# Patient Record
Sex: Female | Born: 1960 | Race: White | Hispanic: No | Marital: Married | Smoking: Current every day smoker
Health system: Southern US, Community
[De-identification: ages and names within clinical notes are randomized; demographics above are authoritative.]

## PROBLEM LIST (undated history)

## (undated) ENCOUNTER — Emergency Department (HOSPITAL_COMMUNITY): Payer: Medicare Other

## (undated) DIAGNOSIS — N939 Abnormal uterine and vaginal bleeding, unspecified: Secondary | ICD-10-CM

## (undated) DIAGNOSIS — D649 Anemia, unspecified: Secondary | ICD-10-CM

## (undated) DIAGNOSIS — I350 Nonrheumatic aortic (valve) stenosis: Secondary | ICD-10-CM

## (undated) DIAGNOSIS — I2510 Atherosclerotic heart disease of native coronary artery without angina pectoris: Secondary | ICD-10-CM

## (undated) DIAGNOSIS — Z9981 Dependence on supplemental oxygen: Secondary | ICD-10-CM

## (undated) DIAGNOSIS — I10 Essential (primary) hypertension: Secondary | ICD-10-CM

## (undated) DIAGNOSIS — F329 Major depressive disorder, single episode, unspecified: Secondary | ICD-10-CM

## (undated) DIAGNOSIS — G4730 Sleep apnea, unspecified: Secondary | ICD-10-CM

## (undated) DIAGNOSIS — E662 Morbid (severe) obesity with alveolar hypoventilation: Secondary | ICD-10-CM

## (undated) DIAGNOSIS — G4733 Obstructive sleep apnea (adult) (pediatric): Secondary | ICD-10-CM

## (undated) DIAGNOSIS — I429 Cardiomyopathy, unspecified: Secondary | ICD-10-CM

## (undated) DIAGNOSIS — I209 Angina pectoris, unspecified: Secondary | ICD-10-CM

## (undated) DIAGNOSIS — E119 Type 2 diabetes mellitus without complications: Secondary | ICD-10-CM

## (undated) DIAGNOSIS — R0600 Dyspnea, unspecified: Secondary | ICD-10-CM

## (undated) DIAGNOSIS — I6529 Occlusion and stenosis of unspecified carotid artery: Secondary | ICD-10-CM

## (undated) DIAGNOSIS — E079 Disorder of thyroid, unspecified: Secondary | ICD-10-CM

## (undated) DIAGNOSIS — J449 Chronic obstructive pulmonary disease, unspecified: Secondary | ICD-10-CM

## (undated) DIAGNOSIS — M1990 Unspecified osteoarthritis, unspecified site: Secondary | ICD-10-CM

## (undated) DIAGNOSIS — I447 Left bundle-branch block, unspecified: Secondary | ICD-10-CM

## (undated) DIAGNOSIS — I700 Atherosclerosis of aorta: Secondary | ICD-10-CM

## (undated) DIAGNOSIS — R7303 Prediabetes: Secondary | ICD-10-CM

## (undated) DIAGNOSIS — E039 Hypothyroidism, unspecified: Secondary | ICD-10-CM

## (undated) DIAGNOSIS — T8859XA Other complications of anesthesia, initial encounter: Secondary | ICD-10-CM

## (undated) DIAGNOSIS — N840 Polyp of corpus uteri: Secondary | ICD-10-CM

## (undated) DIAGNOSIS — F32A Depression, unspecified: Secondary | ICD-10-CM

## (undated) DIAGNOSIS — I509 Heart failure, unspecified: Secondary | ICD-10-CM

## (undated) HISTORY — DX: Nonrheumatic aortic (valve) stenosis: I35.0

## (undated) HISTORY — PX: TONSILLECTOMY: SUR1361

## (undated) HISTORY — PX: TUBAL LIGATION: SHX77

## (undated) HISTORY — PX: CHOLECYSTECTOMY: SHX55

## (undated) HISTORY — DX: Occlusion and stenosis of unspecified carotid artery: I65.29

---

## 2000-10-15 ENCOUNTER — Emergency Department (HOSPITAL_COMMUNITY): Admission: EM | Admit: 2000-10-15 | Discharge: 2000-10-15 | Payer: Self-pay | Admitting: *Deleted

## 2005-08-16 ENCOUNTER — Emergency Department: Payer: Self-pay | Admitting: Emergency Medicine

## 2005-10-14 ENCOUNTER — Other Ambulatory Visit: Payer: Self-pay

## 2005-10-14 ENCOUNTER — Emergency Department: Payer: Self-pay | Admitting: Emergency Medicine

## 2005-12-04 ENCOUNTER — Emergency Department: Payer: Self-pay | Admitting: Emergency Medicine

## 2006-01-19 ENCOUNTER — Emergency Department: Payer: Self-pay | Admitting: Emergency Medicine

## 2007-02-26 ENCOUNTER — Other Ambulatory Visit: Payer: Self-pay

## 2007-02-26 ENCOUNTER — Emergency Department: Payer: Self-pay | Admitting: Emergency Medicine

## 2010-03-19 ENCOUNTER — Emergency Department: Payer: Self-pay | Admitting: Emergency Medicine

## 2012-05-03 ENCOUNTER — Emergency Department: Payer: Self-pay | Admitting: Emergency Medicine

## 2013-02-27 ENCOUNTER — Emergency Department: Payer: Self-pay | Admitting: Emergency Medicine

## 2013-02-27 LAB — CBC WITH DIFFERENTIAL/PLATELET
Basophil #: 0.1 10*3/uL (ref 0.0–0.1)
Basophil %: 1.2 %
Eosinophil #: 0.5 10*3/uL (ref 0.0–0.7)
Eosinophil %: 5.9 %
HCT: 41.9 % (ref 35.0–47.0)
HGB: 13.9 g/dL (ref 12.0–16.0)
Lymphocyte #: 2 10*3/uL (ref 1.0–3.6)
Lymphocyte %: 26.5 %
MCH: 28.6 pg (ref 26.0–34.0)
MCHC: 33.1 g/dL (ref 32.0–36.0)
MCV: 86 fL (ref 80–100)
Monocyte #: 0.6 x10 3/mm (ref 0.2–0.9)
Monocyte %: 8 %
Neutrophil #: 4.5 10*3/uL (ref 1.4–6.5)
Neutrophil %: 58.4 %
Platelet: 156 10*3/uL (ref 150–440)
RBC: 4.84 10*6/uL (ref 3.80–5.20)
RDW: 13.7 % (ref 11.5–14.5)
WBC: 7.7 10*3/uL (ref 3.6–11.0)

## 2013-02-27 LAB — BASIC METABOLIC PANEL
Anion Gap: 0 — ABNORMAL LOW (ref 7–16)
BUN: 11 mg/dL (ref 7–18)
Calcium, Total: 9.1 mg/dL (ref 8.5–10.1)
Chloride: 105 mmol/L (ref 98–107)
Co2: 32 mmol/L (ref 21–32)
Creatinine: 0.72 mg/dL (ref 0.60–1.30)
EGFR (African American): >60
EGFR (Non-African Amer.): >60
Glucose: 100 mg/dL — ABNORMAL HIGH (ref 65–99)
Osmolality: 271 (ref 275–301)
Potassium: 4.2 mmol/L (ref 3.5–5.1)
Sodium: 136 mmol/L (ref 136–145)

## 2014-03-27 ENCOUNTER — Emergency Department: Payer: Self-pay | Admitting: Emergency Medicine

## 2014-03-29 ENCOUNTER — Emergency Department (HOSPITAL_COMMUNITY)
Admission: EM | Admit: 2014-03-29 | Discharge: 2014-03-29 | Disposition: A | Discharge status: Home / Self Care | Payer: Self-pay | Attending: Emergency Medicine | Admitting: Emergency Medicine

## 2014-03-29 DIAGNOSIS — M5431 Sciatica, right side: Secondary | ICD-10-CM | POA: Insufficient documentation

## 2014-03-29 MED ORDER — HYDROCODONE-ACETAMINOPHEN 5-325 MG PO TABS: 1.0000 | ORAL_TABLET | ORAL | Status: DC | PRN

## 2014-03-29 MED ORDER — HYDROCODONE-ACETAMINOPHEN 5-325 MG PO TABS
1.0000 | ORAL_TABLET | Freq: Once | ORAL | Status: AC
  Administered 2014-03-29: 1 via ORAL
  Filled 2014-03-29: qty 1

## 2014-03-29 MED ORDER — PREDNISONE 20 MG PO TABS: 40.0000 mg | ORAL_TABLET | Freq: Every day | ORAL | Status: DC

## 2014-03-29 MED ORDER — KETOROLAC TROMETHAMINE 60 MG/2ML IM SOLN
30.0000 mg | Freq: Once | INTRAMUSCULAR | Status: AC
  Administered 2014-03-29: 30 mg via INTRAMUSCULAR
  Filled 2014-03-29: qty 2

## 2014-03-29 NOTE — ED Notes (Signed)
Pt from home with chronic right hip pain.  Sts she has been seeing her PCP and the ED for management of pain with no relief.  Took 1 diazepam, naproxen, 3 tylenols and a muscle relaxer earlier today. Pain 7/10.

## 2014-03-29 NOTE — ED Provider Notes (Signed)
CSN: [id number]     Arrival date & time 03/29/14  1434 History   First MD Initiated Contact with Patient 03/29/14 1521     Chief Complaint  Patient presents with  . Hip Pain   Kristie Dorsey is a 54 y.o. female with a history of chronic back pain who presents emergency department complaining of right hip pain for the past 3 months. Patient reports her pain is 8 out of 10 and worse with movement. Patient's pain is stabbing. Her pain starts in her posterior buttocks and radiates down her posterior lateral leg. Patient denies any injury or trauma to her hip or leg patient put she has been to several physicians and to [facility] emergency department for treatment. Patient reports she had an x-ray of her hip done 2 days ago that was negative. She does not wish for further imaging at this time but does wish for further pain control. Patient reports that when she woke up this morning she took diazepam, Naprosyn, Tylenol and Flexeril which had some relief but her pain returned. Patient did not repeat her dose of medications as prescribed. Patient reports she has a follow-up appointment with her primary care provider Dr. Hessie DienerBender for tomorrow at 10 am. The patient denies fevers, chills, injury, trauma, history of cancer, history of IV drug use, loss of bowel or bladder control, numbness or tingling.  (Consider location/radiation/quality/duration/timing/severity/associated sxs/prior Treatment) HPI  No past medical history on file. No past surgical history on file. No family history on file. History  Substance Use Topics  . Smoking status: Not on file  . Smokeless tobacco: Not on file  . Alcohol Use: Not on file   OB History    No data available     Review of Systems  Constitutional: Negative for fever and chills.  HENT: Negative for ear pain.   Eyes: Negative for pain.  Respiratory: Negative for cough and shortness of breath.   Cardiovascular: Negative for chest pain.   Gastrointestinal: Negative for nausea, vomiting, abdominal pain and diarrhea.  Genitourinary: Negative for dysuria and hematuria.  Musculoskeletal: Negative for neck pain.       Right hip pain  Skin: Negative for wound.  Neurological: Negative for dizziness, light-headedness, numbness and headaches.      Allergies  Review of patient's allergies indicates not on file.  Home Medications   Prior to Admission medications   Medication Sig Start Date End Date Taking? Authorizing Provider  HYDROcodone-acetaminophen (NORCO/VICODIN) 5-325 MG per tablet Take 1-2 tablets by mouth every 4 (four) hours as needed for moderate pain or severe pain. 03/29/14   Einar GipWilliam Duncan Dragan Tamburrino, PA-C  predniSONE (DELTASONE) 20 MG tablet Take 2 tablets (40 mg total) by mouth daily. 03/29/14   Einar GipWilliam Duncan Reve Crocket, PA-C   BP 156/57 mmHg  Pulse 77  Temp(Src) 97.6 F (36.4 C) (Oral)  Resp 24  Ht 5\' 2"  (1.575 m)  Wt 312 lb (141.522 kg)  BMI 57.05 kg/m2  SpO2 96% Physical Exam  Constitutional: She is oriented to person, place, and time. She appears well-developed and well-nourished. No distress.  HENT:  Head: Normocephalic and atraumatic.  Eyes: Pupils are equal, round, and reactive to light. Right eye exhibits no discharge. Left eye exhibits no discharge.  Neck: Neck supple.  Cardiovascular: Normal rate, regular rhythm, normal heart sounds and intact distal pulses.   Bilateral radial pulses are intact. Bilateral posterior tibialis and dorsalis pedis pulses are intact.  Pulmonary/Chest: Effort normal and breath sounds normal. No  respiratory distress.  Musculoskeletal: She exhibits no tenderness.  Patient is able to ambulate in the room. Patient has 5 out of 5 strength in her bilateral lower extremities. Back is nontender to palpation. There is no deformity. Patient has full range of motion of her right hip.  Lymphadenopathy:    She has no cervical adenopathy.  Neurological: She is alert and oriented to  person, place, and time. Coordination normal.  Patient sensation is intact in her bilateral lower extremities.  Skin: Skin is warm and dry. No rash noted. She is not diaphoretic. No erythema. No pallor.  Psychiatric: She has a normal mood and affect. Her behavior is normal.  Nursing note and vitals reviewed.   ED Course  Procedures (including critical care time) Labs Review Labs Reviewed - No data to display  Imaging Review No results found.   EKG Interpretation None      Filed Vitals:   03/29/14 1438  BP: 156/57  Pulse: 77  Temp: 97.6 F (36.4 C)  TempSrc: Oral  Resp: 24  Height:  (1.575 m)  Weight: 312 lb (141.522 kg)  SpO2: 96%     MDM   Meds given in ED:  Medications  ketorolac (TORADOL) injection 30 mg (30 mg Intramuscular Given 03/29/14 1604)  HYDROcodone-acetaminophen (NORCO/VICODIN) 5-325 MG per tablet 1 tablet (1 tablet Oral Given 03/29/14 1604)    New Prescriptions   HYDROCODONE-ACETAMINOPHEN (NORCO/VICODIN) 5-325 MG PER TABLET    Take 1-2 tablets by mouth every 4 (four) hours as needed for moderate pain or severe pain.   PREDNISONE (DELTASONE) 20 MG TABLET    Take 2 tablets (40 mg total) by mouth daily.   Final diagnoses:  Sciatica, right   This is a 54 year old female who presents the emergency department complaining of 3 months of right hip pain. Based on history this sounds more like sciatica nerve pain. Her pain starts in her posterior buttocks and radiates down her posterior lateral leg. Patient was seen and evaluated at [facility] emergency department 2 days ago and had negative x-rays done of her hip. Patient does not wish for further imaging done at this time. I agree with this plan. Patient is neurologically intact. Patient is able to ambulate in the room. We'll provide the patient with an injection of Toradol and Norco tablet. We'll discharge the patient with Norco for breakthrough pain and steroid burst. Narcotic pain medication  precautions provided.  I advised patient continue to take Naprosyn as prescribed in the ED. The patient has a follow-up appointment with her primary care provider for tomorrow at 10 AM. I advised patient to keep this follow up appointment. I advised the patient to return to the emergency department with new or worsening symptoms or new concerns. The patient verbalized understanding and agreement with plan.       Lawana Chambers, PA-C 03/29/14 1614  Arby Barrette, MD 03/29/14 [phone]

## 2014-03-29 NOTE — Discharge Instructions (Signed)
Please take medications as prescribed. Please use caution while taking Norco as it can make you drowsy. Please do not take Tylenol with Norco as it hard he has Tylenol in it. Please continue taking Naprosyn as prescribed by your PCP. Please stop taking diazepam. Please keep your follow-up appointment with your primary care provider for tomorrow as discussed.  Sciatica Sciatica is pain, weakness, numbness, or tingling along the path of the sciatic nerve. The nerve starts in the lower back and runs down the back of each leg. The nerve controls the muscles in the lower leg and in the back of the knee, while also providing sensation to the back of the thigh, lower leg, and the sole of your foot. Sciatica is a symptom of another medical condition. For instance, nerve damage or certain conditions, such as a herniated disk or bone spur on the spine, pinch or put pressure on the sciatic nerve. This causes the pain, weakness, or other sensations normally associated with sciatica. Generally, sciatica only affects one side of the body. CAUSES   Herniated or slipped disc.  Degenerative disk disease.  A pain disorder involving the narrow muscle in the buttocks (piriformis syndrome).  Pelvic injury or fracture.  Pregnancy.  Tumor (rare). SYMPTOMS  Symptoms can vary from mild to very severe. The symptoms usually travel from the low back to the buttocks and down the back of the leg. Symptoms can include:  Mild tingling or dull aches in the lower back, leg, or hip.  Numbness in the back of the calf or sole of the foot.  Burning sensations in the lower back, leg, or hip.  Sharp pains in the lower back, leg, or hip.  Leg weakness.  Severe back pain inhibiting movement. These symptoms may get worse with coughing, sneezing, laughing, or prolonged sitting or standing. Also, being overweight may worsen symptoms. DIAGNOSIS  Your caregiver will perform a physical exam to look for common symptoms of sciatica.  He or she may ask you to do certain movements or activities that would trigger sciatic nerve pain. Other tests may be performed to find the cause of the sciatica. These may include:  Blood tests.  X-rays.  Imaging tests, such as an MRI or CT scan. TREATMENT  Treatment is directed at the cause of the sciatic pain. Sometimes, treatment is not necessary and the pain and discomfort goes away on its own. If treatment is needed, your caregiver may suggest:  Over-the-counter medicines to relieve pain.  Prescription medicines, such as anti-inflammatory medicine, muscle relaxants, or narcotics.  Applying heat or ice to the painful area.  Steroid injections to lessen pain, irritation, and inflammation around the nerve.  Reducing activity during periods of pain.  Exercising and stretching to strengthen your abdomen and improve flexibility of your spine. Your caregiver may suggest losing weight if the extra weight makes the back pain worse.  Physical therapy.  Surgery to eliminate what is pressing or pinching the nerve, such as a bone spur or part of a herniated disk. HOME CARE INSTRUCTIONS   Only take over-the-counter or prescription medicines for pain or discomfort as directed by your caregiver.  Apply ice to the affected area for 20 minutes, 3-4 times a day for the first 48-72 hours. Then try heat in the same way.  Exercise, stretch, or perform your usual activities if these do not aggravate your pain.  Attend physical therapy sessions as directed by your caregiver.  Keep all follow-up appointments as directed by your caregiver.  Do not wear high heels or shoes that do not provide proper support.  Check your mattress to see if it is too soft. A firm mattress may lessen your pain and discomfort. SEEK IMMEDIATE MEDICAL CARE IF:   You lose control of your bowel or bladder (incontinence).  You have increasing weakness in the lower back, pelvis, buttocks, or legs.  You have redness or  swelling of your back.  You have a burning sensation when you urinate.  You have pain that gets worse when you lie down or awakens you at night.  Your pain is worse than you have experienced in the past.  Your pain is lasting longer than 4 weeks.  You are suddenly losing weight without reason. MAKE SURE YOU:  Understand these instructions.  Will watch your condition.  Will get help right away if you are not doing well or get worse. Document Released: 02/10/2001 Document Revised: 08/18/2011 Document Reviewed: 06/28/2011 [facility] Patient Information 2015 [employer] [place]. This information is not intended to replace advice given to you by your health care provider. Make sure you discuss any questions you have with your health care provider.

## 2014-03-29 NOTE — ED Notes (Signed)
Pt was seen at [facility] ED Tuesday and was given medications for pain. Pain was relieved with medication. Pt has not taken medication today. PA discussing that medication wears off with pt.

## 2016-03-21 ENCOUNTER — Encounter: Payer: Self-pay | Admitting: Emergency Medicine

## 2016-03-21 ENCOUNTER — Emergency Department
Admission: EM | Admit: 2016-03-21 | Discharge: 2016-03-21 | Disposition: A | Discharge status: Home / Self Care | Payer: Self-pay | Attending: Emergency Medicine | Admitting: Emergency Medicine

## 2016-03-21 DIAGNOSIS — K0889 Other specified disorders of teeth and supporting structures: Secondary | ICD-10-CM

## 2016-03-21 DIAGNOSIS — K047 Periapical abscess without sinus: Secondary | ICD-10-CM | POA: Insufficient documentation

## 2016-03-21 MED ORDER — NAPROXEN 500 MG PO TABS: 500.0000 mg | ORAL_TABLET | Freq: Two times a day (BID) | ORAL | 0 refills | Status: DC

## 2016-03-21 MED ORDER — OXYCODONE-ACETAMINOPHEN 5-325 MG PO TABS: 1.0000 | ORAL_TABLET | ORAL | 0 refills | Status: DC | PRN

## 2016-03-21 MED ORDER — AMOXICILLIN 875 MG PO TABS: 875.0000 mg | ORAL_TABLET | Freq: Two times a day (BID) | ORAL | 0 refills | Status: DC

## 2016-03-21 NOTE — ED Triage Notes (Signed)
Pt reports left side upper dental pain.

## 2016-03-21 NOTE — ED Provider Notes (Signed)
[facility] Emergency Department Provider Note   ____________________________________________   First MD Initiated Contact with Patient 03/21/16 1827     (approximate)  I have reviewed the triage vital signs and the nursing notes.   HISTORY  Chief Complaint Dental Pain    HPI Kristie Dorsey is a 56 y.o. female presents for evaluation of left upper dental pain. Patient is edentulous except for 3 teeth. States the pain started about 3 days ago and is progressively gotten worse. Relief with over-the-counter medication and plans on seeing a dentist to get the rest of her teeth removed.   No past medical history on file.  There are no active problems to display for this patient.   No past surgical history on file.  Prior to Admission medications   Medication Sig Start Date End Date Taking? Authorizing Provider  amoxicillin (AMOXIL) 875 MG tablet Take 1 tablet (875 mg total) by mouth 2 (two) times daily. 03/21/16   Evangeline Dakin, PA-C  naproxen (NAPROSYN) 500 MG tablet Take 1 tablet (500 mg total) by mouth 2 (two) times daily with a meal. 03/21/16   Evangeline Dakin, PA-C  oxyCODONE-acetaminophen (ROXICET) 5-325 MG tablet Take 1-2 tablets by mouth every 4 (four) hours as needed for severe pain. 03/21/16   Evangeline Dakin, PA-C    Allergies Patient has no known allergies.  No family history on file.  Social History Social History  Substance Use Topics  . Smoking status: Not on file  . Smokeless tobacco: Not on file  . Alcohol use Not on file    Review of Systems Constitutional: No fever/chills ENT: No sore throat.Positive for left upper dental pain. Cardiovascular: Denies chest pain. Respiratory: Denies shortness of breath. Skin: Negative for rash. Neurological: Negative for headaches, focal weakness or numbness.  10-point ROS otherwise negative.  ____________________________________________   PHYSICAL EXAM:  VITAL SIGNS: ED  Triage Vitals  Enc Vitals Group     BP 03/21/16 1804 (!) 152/54     Pulse Rate 03/21/16 1804 77     Resp 03/21/16 1804 18     Temp 03/21/16 1804 98.4 F (36.9 C)     Temp Source 03/21/16 1804 Oral     SpO2 03/21/16 1804 96 %     Weight 03/21/16 1804 283 lb (128.4 kg)     Height 03/21/16 1804 5\' 2"  (1.575 m)     Head Circumference --      Peak Flow --      Pain Score 03/21/16 1805 9     Pain Loc --      Pain Edu? --      Excl. in GC? --     Constitutional: Alert and oriented. Well appearing and in no acute distress. Head: Atraumatic.The left maxillary tenderness and edema noted. Nose: No congestion/rhinnorhea. Mouth/Throat: Mucous membranes are moist.  Oropharynx non-erythematous.Mostly edentulous positive dental caries with remaining teeth. Neck: No stridor. Full full range of motion nontender.   Cardiovascular: Normal rate, regular rhythm. Grossly normal heart sounds.  Good peripheral circulation. Respiratory: Normal respiratory effort.  No retractions. Lungs CTAB. Neurologic:  Normal speech and language. No gross focal neurologic deficits are appreciated. No gait instability. Skin:  Skin is warm, dry and intact. No rash noted. Psychiatric: Mood and affect are normal. Speech and behavior are normal.  ____________________________________________   LABS (all labs ordered are listed, but only abnormal results are displayed)  Labs Reviewed - No data to display ____________________________________________  EKG  ____________________________________________  RADIOLOGY   ____________________________________________   PROCEDURES  Procedure(s) performed: None  Procedures  Critical Care performed: No  ____________________________________________   INITIAL IMPRESSION / ASSESSMENT AND PLAN / ED COURSE  Pertinent labs & imaging results that were available during my care of the patient were reviewed by me and considered in my medical decision making (see chart for  details).  Acute dental abscess. Rx given for amoxicillin 500 mg 3 times a day, Percocet 5/325 and Naprosyn 500 mg twice a day. Patient follow-up PCP or return to ER with any worsening symptomology      ____________________________________________   FINAL CLINICAL IMPRESSION(S) / ED DIAGNOSES  Final diagnoses:  Pain, dental  Abscess, dental      NEW MEDICATIONS STARTED DURING THIS VISIT:  Discharge Medication List as of 03/21/2016  6:50 PM    START taking these medications   Details  amoxicillin (AMOXIL) 875 MG tablet Take 1 tablet (875 mg total) by mouth 2 (two) times daily., Starting Sat 03/21/2016, Print    naproxen (NAPROSYN) 500 MG tablet Take 1 tablet (500 mg total) by mouth 2 (two) times daily with a meal., Starting Sat 03/21/2016, Print    oxyCODONE-acetaminophen (ROXICET) 5-325 MG tablet Take 1-2 tablets by mouth every 4 (four) hours as needed for severe pain., Starting Sat 03/21/2016, Print         Note:  This document was prepared using Dragon voice recognition software and may include unintentional dictation errors.   Evangeline Dakinharles M Zurisadai Helminiak, PA-C 03/21/16 1944    Jene Everyobert Kinner, MD 03/21/16 [place]

## 2016-03-21 NOTE — Discharge Instructions (Signed)
OPTIONS FOR DENTAL FOLLOW UP CARE ° °[place] Department of Health and Human Services - Local Safety Net Dental Clinics °http://www.ncdhhs.gov/dph/oralhealth/services/safetynetclinics.htm °  °[facility] (336-562-3123) ° °[facility] [place] (919-933-9087) ° °[facility] ([phone] ext [phone]) ° °[facility] ([phone]) ° °[facility] ([phone]) °This clinic caters to the indigent population and is on a lottery system. °Location: °[facility], [address], [place] °Clinic Hours: °Wednesdays from 6pm - 9pm, patients seen by a lottery system. °For dates, call or go to www.med.[facility].edu/[facility]/patients/Dental-[facility] °Services: °Cleanings, fillings and simple extractions. °Payment Options: °DENTAL WORK IS FREE OF CHARGE. Bring proof of income or support. °Best way to get seen: °Arrive at 5:15 pm - this is a lottery, NOT first come/first serve, so arriving earlier will not increase your chances of being seen. °  °  °[facility] °919-537-3737 °Select option 1 for emergencies °  °Location: °[facility], [address], [place] °Clinic Hours: °No walk-ins accepted - call the day before to schedule an appointment. °Check in times are 9:30 am and 1:30 pm. °Services: °Simple extractions, temporary fillings, pulpectomy/pulp debridement, uncomplicated abscess drainage. °Payment Options: °PAYMENT IS DUE AT THE TIME OF SERVICE.  Fee is usually $100-200, additional surgical procedures (e.g. abscess drainage) may be extra. °Cash, checks, Visa/MasterCard accepted.  Can file Medicaid if patient is covered for dental - patient should call case worker to check. °No discount for [facility] patients. °Best way to get seen: °MUST call the day before and get onto the schedule. Can usually be seen the next 1-2 days. No walk-ins accepted. °  °  °[facility] °[phone] °   °Location: °[facility], [address], [place] °Clinic Hours: °M, W, Th, F 8am or 1:30pm, Tues 9a or 1:30 - first come/first served. °Services: °Simple extractions, temporary fillings, uncomplicated abscess drainage.  You do not need to be an [place] resident. °Payment Options: °PAYMENT IS DUE AT THE TIME OF SERVICE. °Dental insurance, otherwise sliding scale - bring proof of income or support. °Depending on income and treatment needed, cost is usually $50-200. °Best way to get seen: °Arrive early as it is first come/first served. °  °  °[facility] °[phone] °  °Location: °[address] °Clinic Hours: °Mon-Thu 8a-5p °Services: °Most basic dental services including extractions and fillings. °Payment Options: °PAYMENT IS DUE AT THE TIME OF SERVICE. °Sliding scale, up to 50% off - bring proof if income or support. °Medicaid with dental option accepted. °Best way to get seen: °Call to schedule an appointment, can usually be seen within 2 weeks OR they will try to see walk-ins - show up at 8a or 2p (you may have to wait). °  °  °[facility] °919-245-2435 °[place] RESIDENTS ONLY °  °Location: °[facility], [address], [place], [place] [address] °Clinic Hours: By appointment only. °Monday - Thursday 8am-5pm, Friday 8am-12pm °Services: Cleanings, fillings, extractions. °Payment Options: °PAYMENT IS DUE AT THE TIME OF SERVICE. °Cash, Visa or MasterCard. Sliding scale - $30 minimum per service. °Best way to get seen: °Come in to office, complete packet and make an appointment - need proof of income °or support monies for each household member and proof of [place] residence. °Usually takes about a month to get in. °  °  °[facility] °[phone] °  °Location: °[address]   [place] °Clinic Hours: Walk-in Urgent Care Dental Services are offered Monday-Friday  mornings only. °The numbers of emergencies accepted daily is limited to the number of °providers available. °Maximum 15 - Mondays, Wednesdays & Thursdays °Maximum 10 - Tuesdays & Fridays °Services: °You do not need to be a [place] resident to be seen for a dental emergency. °Emergencies are defined as pain, swelling, abnormal bleeding, or dental trauma. Walkins will receive x-rays if needed. °NOTE: Dental cleaning is not an emergency. °Payment Options: °PAYMENT IS DUE AT THE TIME OF SERVICE. °Minimum co-pay is $40.00 for uninsured patients. °Minimum co-pay is $3.00 for Medicaid with dental coverage. °Dental Insurance is accepted and must be presented at time of visit. °Medicare does not cover dental. °Forms of payment: Cash, credit card, checks. °Best way to get seen: °If not previously registered with the clinic, walk-in dental registration begins at 7:15 am and is on a first come/first serve basis. °If previously registered with the clinic, call to make an appointment. °  °  °[facility] °919-776-4359 °[place] RESIDENTS ONLY °  °Location: °[address], [place], [place] °Clinic Hours: °Mon-Thu 10a-2p °Services: Extractions only! °Payment Options: °FREE (donations accepted) - bring proof of income or support °Best way to get seen: °Call and schedule an appointment OR come at 8am on the 1st Monday of every month (except for holidays) when it is first come/first served. °  °  °[facility] °[phone] °  °Location: °[address], [place] °Clinic Hours: °Friday mornings °Services, Payment Options, Best way to get seen: °Call for info °

## 2016-03-21 NOTE — ED Notes (Signed)
Left side dental pain for the past several days, worsening in pain. Painful to chew and talk.

## 2016-08-13 ENCOUNTER — Emergency Department
Admission: EM | Admit: 2016-08-13 | Discharge: 2016-08-13 | Disposition: A | Discharge status: Home / Self Care | Payer: Self-pay | Attending: Emergency Medicine | Admitting: Emergency Medicine

## 2016-08-13 ENCOUNTER — Emergency Department: Payer: Self-pay

## 2016-08-13 ENCOUNTER — Encounter: Payer: Self-pay | Admitting: Emergency Medicine

## 2016-08-13 DIAGNOSIS — M25561 Pain in right knee: Secondary | ICD-10-CM | POA: Insufficient documentation

## 2016-08-13 DIAGNOSIS — Y929 Unspecified place or not applicable: Secondary | ICD-10-CM | POA: Insufficient documentation

## 2016-08-13 DIAGNOSIS — I10 Essential (primary) hypertension: Secondary | ICD-10-CM | POA: Insufficient documentation

## 2016-08-13 DIAGNOSIS — S52124A Nondisplaced fracture of head of right radius, initial encounter for closed fracture: Secondary | ICD-10-CM | POA: Insufficient documentation

## 2016-08-13 DIAGNOSIS — S62001A Unspecified fracture of navicular [scaphoid] bone of right wrist, initial encounter for closed fracture: Secondary | ICD-10-CM | POA: Insufficient documentation

## 2016-08-13 DIAGNOSIS — F17210 Nicotine dependence, cigarettes, uncomplicated: Secondary | ICD-10-CM | POA: Insufficient documentation

## 2016-08-13 DIAGNOSIS — Y999 Unspecified external cause status: Secondary | ICD-10-CM | POA: Insufficient documentation

## 2016-08-13 DIAGNOSIS — W19XXXA Unspecified fall, initial encounter: Secondary | ICD-10-CM | POA: Insufficient documentation

## 2016-08-13 DIAGNOSIS — Y939 Activity, unspecified: Secondary | ICD-10-CM | POA: Insufficient documentation

## 2016-08-13 HISTORY — DX: Essential (primary) hypertension: I10

## 2016-08-13 MED ORDER — OXYCODONE-ACETAMINOPHEN 5-325 MG PO TABS
1.0000 | ORAL_TABLET | Freq: Once | ORAL | Status: AC
  Administered 2016-08-13: 1 via ORAL
  Filled 2016-08-13: qty 1

## 2016-08-13 MED ORDER — OXYCODONE-ACETAMINOPHEN 5-325 MG PO TABS: 1.0000 | ORAL_TABLET | Freq: Four times a day (QID) | ORAL | 0 refills | Status: AC | PRN

## 2016-08-13 NOTE — ED Notes (Signed)
See triage note  Larey SeatFell having pain to right shoulder,elbow and hand  No deformity noted positive pulses

## 2016-08-13 NOTE — ED Provider Notes (Signed)
[facility] Emergency Department Provider Note  ____________________________________________  Time seen: Approximately 7:48 PM  I have reviewed the triage vital signs and the nursing notes.   HISTORY  Chief Complaint Arm Pain and Fall    HPI Kristie Dorsey is a 56 y.o. female presenting to the emergency department with 10/10 aching right elbow, right shoulder, right hand and right knee pain after patient lost her footing and fell. Patient denies hitting her head. Patient denies radiculopathy, changes in sensation or weakness. Patient denies associated chest pain, chest tightness, nausea, vomiting or abdominal pain. Patient states that she has been unable to bear weight due to right knee pain since the fall. No alleviating measures have been attempted.   Past Medical History:  Diagnosis Date  . Hypertension     There are no active problems to display for this patient.   Past Surgical History:  Procedure Laterality Date  . CHOLECYSTECTOMY      Prior to Admission medications   Medication Sig Start Date End Date Taking? Authorizing Provider  amoxicillin (AMOXIL) 875 MG tablet Take 1 tablet (875 mg total) by mouth 2 (two) times daily. 03/21/16   Beers, Charmayne Sheerharles M, PA-C  naproxen (NAPROSYN) 500 MG tablet Take 1 tablet (500 mg total) by mouth 2 (two) times daily with a meal. 03/21/16   Beers, Charmayne Sheerharles M, PA-C  oxyCODONE-acetaminophen (ROXICET) 5-325 MG tablet Take 1 tablet by mouth every 6 (six) hours as needed for severe pain. 08/13/16 08/18/16  Orvil FeilWoods, Emiliana Blaize M, PA-C    Allergies Patient has no known allergies.  No family history on file.  Social History Social History  Substance Use Topics  . Smoking status: Current Every Day Smoker    Packs/day: 0.50    Types: Cigarettes  . Smokeless tobacco: Current User     Comment: vape  . Alcohol use No     Review of Systems  Constitutional: No fever/chills Eyes: No visual changes. No discharge ENT: No  upper respiratory complaints. Cardiovascular: no chest pain. Respiratory: no cough. No SOB. Musculoskeletal: She has right elbow, right shoulder, right hand and right knee pain Skin: Negative for rash, abrasions, lacerations, ecchymosis. Neurological: Negative for headaches, focal weakness or numbness.  ____________________________________________   PHYSICAL EXAM:  VITAL SIGNS: ED Triage Vitals  Enc Vitals Group     BP 08/13/16 1746 (!) 113/49     Pulse Rate 08/13/16 1746 71     Resp 08/13/16 1746 16     Temp 08/13/16 1746 97.7 F (36.5 C)     Temp Source 08/13/16 1746 Oral     SpO2 08/13/16 1746 98 %     Weight 08/13/16 1747 298 lb (135.2 kg)     Height 08/13/16 1747 5\' 2"  (1.575 m)     Head Circumference --      Peak Flow --      Pain Score 08/13/16 1754 10     Pain Loc --      Pain Edu? --      Excl. in GC? --      Constitutional: Alert and oriented. Well appearing and in no acute distress. Eyes: Conjunctivae are normal. PERRL. EOMI. Head: Atraumatic. Cardiovascular: Normal rate, regular rhythm. Normal S1 and S2.  Good peripheral circulation. Respiratory: Normal respiratory effort without tachypnea or retractions. Lungs CTAB. Good air entry to the bases with no decreased or absent breath sounds. Musculoskeletal: She has 5 out of 5 strength in the upper extremities bilaterally. Patient is unable to  perform full range of motion at the right elbow.Patient performs full range of motion at the right wrist and right shoulder. She is able to move all 5 right fingers. No weakness was elicited with rotator cuff testing. Patient has mild pain with palpation over the anatomical snuffbox. Patient is unwilling to perform full range of motion at the right knee. Negative anterior and posterior drawer test, right. Negative ballottement. No laxity of the MCL or LCL testing. Palpable radial, ulnar and dorsalis pedis pulses. Neurologic:  Normal speech and language. No gross focal neurologic  deficits are appreciated.  Skin:  Skin is warm, dry and intact. No rash noted. Psychiatric: Mood and affect are normal. Speech and behavior are normal. Patient exhibits appropriate insight and judgement.   ____________________________________________   LABS (all labs ordered are listed, but only abnormal results are displayed)  Labs Reviewed - No data to display ____________________________________________  EKG   ____________________________________________  RADIOLOGY Geraldo Pitter, personally viewed and evaluated these images (plain radiographs) as part of my medical decision making, as well as reviewing the written report by the radiologist.  Dg Shoulder Right  Result Date: 08/13/2016 CLINICAL DATA:  Status post fall with right shoulder pain. EXAM: RIGHT SHOULDER - 2+ VIEW COMPARISON:  None. FINDINGS: There is no evidence of fracture or dislocation. Soft tissues are unremarkable. IMPRESSION: No acute fracture or dislocation. Electronically Signed   By: Sherian Rein M.D.   On: 08/13/2016 18:50   Dg Elbow Complete Right  Result Date: 08/13/2016 CLINICAL DATA:  Fall with pain to the elbow EXAM: RIGHT ELBOW - COMPLETE 3+ VIEW COMPARISON:  None. FINDINGS: Large elbow effusion. Mildly depressed and displaced fracture involving the radial head with intra-articular extension. Lateral view demonstrates a small linear suspected osseous fragment overlying the joint effusion. IMPRESSION: Large elbow effusion with mildly displaced and depressed intra-articular fracture of the radial head. Suspect small bone fragment over the elbow effusion. Electronically Signed   By: Jasmine Pang M.D.   On: 08/13/2016 18:51   Dg Knee Complete 4 Views Right  Result Date: 08/13/2016 CLINICAL DATA:  Right knee pain following a fall. EXAM: RIGHT KNEE - COMPLETE 4+ VIEW COMPARISON:  None. FINDINGS: Moderate spur formation involving all 3 joint compartments. No fracture, dislocation or effusion. IMPRESSION: No  fracture.  Moderate tricompartmental degenerative changes. Electronically Signed   By: Beckie Salts M.D.   On: 08/13/2016 20:15   Dg Hand Complete Right  Result Date: 08/13/2016 CLINICAL DATA:  Mechanical fall with pain EXAM: RIGHT HAND - COMPLETE 3+ VIEW COMPARISON:  None. FINDINGS: Possible subtle linear lucency within the mid to distal pole of the scaphoid bone on the oblique view. No subluxation. No radiopaque foreign body. IMPRESSION: Possible linear lucency/subtle nondisplaced fracture of the mid to distal scaphoid, correlate clinically for snuffbox tenderness. Consider dedicated scaphoid view. Electronically Signed   By: Jasmine Pang M.D.   On: 08/13/2016 18:53    ____________________________________________    PROCEDURES  Procedure(s) performed:    Procedures    Medications  oxyCODONE-acetaminophen (PERCOCET/ROXICET) 5-325 MG per tablet 1 tablet (1 tablet Oral Given 08/13/16 1953)     ____________________________________________   INITIAL IMPRESSION / ASSESSMENT AND PLAN / ED COURSE  Pertinent labs & imaging results that were available during my care of the patient were reviewed by me and considered in my medical decision making (see chart for details).  Review of the [place] CSRS was performed in accordance of the [facility] prior to dispensing any controlled drugs.  Assessment and plan: Fall Patient presents to the emergency department with right shoulder, right elbow, right hand and right knee pain. DG right shoulder reveals no acute fractures or bony abnormalities. DG right elbow reveals an intra-articular radial head fracture. DG right hand reveals likely scaphoid fracture. DG right knee reveals no acute abnormality. Patient was placed in a splint to immobilize both the right elbow and wrist in the emergency department. Roxicet was given for pain. A referral was given orthopedics, Dr. Martha Clan. Patient was advised to make an appointment as soon as possible for follow-up.   Patient was discharged with roxicet. All patient questions were answered.    ____________________________________________  FINAL CLINICAL IMPRESSION(S) / ED DIAGNOSES  Final diagnoses:  Fall, initial encounter  Closed nondisplaced fracture of head of right radius, initial encounter  Closed nondisplaced fracture of scaphoid of right wrist, unspecified portion of scaphoid, initial encounter      NEW MEDICATIONS STARTED DURING THIS VISIT:  New Prescriptions   OXYCODONE-ACETAMINOPHEN (ROXICET) 5-325 MG TABLET    Take 1 tablet by mouth every 6 (six) hours as needed for severe pain.        This chart was dictated using voice recognition software/Dragon. Despite best efforts to proofread, errors can occur which can change the meaning. Any change was purely unintentional.    Orvil Feil, PA-C 08/13/16 2149    Charlynne Pander, MD 08/13/16 2236

## 2016-08-13 NOTE — ED Triage Notes (Signed)
Pt to ED via POV with c/o mechanical fall today, denies any LOC. Pt c/o RT hand, elbow, and shoulder pain from falling onto RT side. Pt denies any head injury. Pt A&OX4

## 2016-08-13 NOTE — ED Notes (Signed)

## 2018-02-28 ENCOUNTER — Emergency Department: Payer: Self-pay

## 2018-02-28 ENCOUNTER — Encounter: Payer: Self-pay | Admitting: Emergency Medicine

## 2018-02-28 ENCOUNTER — Other Ambulatory Visit: Payer: Self-pay

## 2018-02-28 ENCOUNTER — Emergency Department
Admission: EM | Admit: 2018-02-28 | Discharge: 2018-03-01 | Disposition: A | Discharge status: Home / Self Care | Payer: Self-pay | Attending: Emergency Medicine | Admitting: Emergency Medicine

## 2018-02-28 DIAGNOSIS — R0902 Hypoxemia: Secondary | ICD-10-CM | POA: Insufficient documentation

## 2018-02-28 DIAGNOSIS — J441 Chronic obstructive pulmonary disease with (acute) exacerbation: Secondary | ICD-10-CM

## 2018-02-28 DIAGNOSIS — J449 Chronic obstructive pulmonary disease, unspecified: Secondary | ICD-10-CM | POA: Insufficient documentation

## 2018-02-28 DIAGNOSIS — F17210 Nicotine dependence, cigarettes, uncomplicated: Secondary | ICD-10-CM | POA: Insufficient documentation

## 2018-02-28 DIAGNOSIS — R0989 Other specified symptoms and signs involving the circulatory and respiratory systems: Secondary | ICD-10-CM

## 2018-02-28 HISTORY — DX: Morbid (severe) obesity due to excess calories: E66.01

## 2018-02-28 HISTORY — DX: Major depressive disorder, single episode, unspecified: F32.9

## 2018-02-28 HISTORY — DX: Depression, unspecified: F32.A

## 2018-02-28 HISTORY — DX: Disorder of thyroid, unspecified: E07.9

## 2018-02-28 HISTORY — DX: Chronic obstructive pulmonary disease, unspecified: J44.9

## 2018-02-28 HISTORY — DX: Heart failure, unspecified: I50.9

## 2018-02-28 LAB — CBC WITH DIFFERENTIAL/PLATELET
Abs Immature Granulocytes: 0.03 10*3/uL (ref 0.00–0.07)
Basophils Absolute: 0 10*3/uL (ref 0.0–0.1)
Basophils Relative: 1 %
Eosinophils Absolute: 0.3 10*3/uL (ref 0.0–0.5)
Eosinophils Relative: 4 %
HCT: 38.6 % (ref 36.0–46.0)
Hemoglobin: 12 g/dL (ref 12.0–15.0)
Immature Granulocytes: 0 %
Lymphocytes Relative: 21 %
Lymphs Abs: 1.6 10*3/uL (ref 0.7–4.0)
MCH: 28.8 pg (ref 26.0–34.0)
MCHC: 31.1 g/dL (ref 30.0–36.0)
MCV: 92.8 fL (ref 80.0–100.0)
Monocytes Absolute: 0.5 10*3/uL (ref 0.1–1.0)
Monocytes Relative: 6 %
Neutro Abs: 5.4 10*3/uL (ref 1.7–7.7)
Neutrophils Relative %: 68 %
Platelets: 175 10*3/uL (ref 150–400)
RBC: 4.16 MIL/uL (ref 3.87–5.11)
RDW: 13.4 % (ref 11.5–15.5)
WBC: 8 10*3/uL (ref 4.0–10.5)
nRBC: 0 % (ref 0.0–0.2)

## 2018-02-28 LAB — COMPREHENSIVE METABOLIC PANEL
ALT: 15 U/L (ref 0–44)
AST: 14 U/L — ABNORMAL LOW (ref 15–41)
Albumin: 3.3 g/dL — ABNORMAL LOW (ref 3.5–5.0)
Alkaline Phosphatase: 72 U/L (ref 38–126)
Anion gap: 6 (ref 5–15)
BUN: 14 mg/dL (ref 6–20)
CO2: 31 mmol/L (ref 22–32)
Calcium: 8.9 mg/dL (ref 8.9–10.3)
Chloride: 99 mmol/L (ref 98–111)
Creatinine, Ser: 0.79 mg/dL (ref 0.44–1.00)
GFR calc Af Amer: >60 mL/min (ref >60)
GFR calc non Af Amer: >60 mL/min (ref >60)
Glucose, Bld: 116 mg/dL — ABNORMAL HIGH (ref 70–99)
Potassium: 4.3 mmol/L (ref 3.5–5.1)
Sodium: 136 mmol/L (ref 135–145)
Total Bilirubin: 0.5 mg/dL (ref 0.3–1.2)
Total Protein: 6.8 g/dL (ref 6.5–8.1)

## 2018-02-28 LAB — BRAIN NATRIURETIC PEPTIDE: B Natriuretic Peptide: 73 pg/mL (ref 0.0–100.0)

## 2018-02-28 LAB — TROPONIN I: Troponin I: <0.03 ng/mL (ref <0.03)

## 2018-02-28 MED ORDER — IPRATROPIUM-ALBUTEROL 0.5-2.5 (3) MG/3ML IN SOLN
3.0000 mL | Freq: Once | RESPIRATORY_TRACT | Status: AC
  Administered 2018-02-28: 3 mL via RESPIRATORY_TRACT
  Filled 2018-02-28: qty 3

## 2018-02-28 MED ORDER — FUROSEMIDE 40 MG PO TABS
40.0000 mg | ORAL_TABLET | ORAL | Status: AC
  Administered 2018-02-28: 40 mg via ORAL
  Filled 2018-02-28: qty 1

## 2018-02-28 MED ORDER — PREDNISONE 20 MG PO TABS
60.0000 mg | ORAL_TABLET | ORAL | Status: AC
  Administered 2018-02-28: 60 mg via ORAL
  Filled 2018-02-28: qty 3

## 2018-02-28 NOTE — ED Triage Notes (Addendum)
Pt to triage via w/c with no distress noted; pt reports voice hoarseness, SHOB with any exertion; st pulse ox was in the 80's at home; denies any hx resp disease; denies pain, denies cough; + smoker; o2 sat 89-92% on ra; O2 placed at 2l/min via [place]

## 2018-02-28 NOTE — ED Notes (Signed)
Pt states that her breathing has been getting progressively worse since yesterday. Has SOB and heaviness on her chest. Family at bedside. Pt not normally on oxygen at home.

## 2018-02-28 NOTE — ED Provider Notes (Signed)
[facility] Emergency Department Provider Note  ____________________________________________   First MD Initiated Contact with Patient 02/28/18 2259     (approximate)  I have reviewed the triage vital signs and the nursing notes.   HISTORY  Chief Complaint Shortness of Breath    HPI Kristie Dorsey is a 57 y.o. female with medical history as listed above who presents by private vehicle for evaluation of gradually worsening shortness of breath over about a week.  She states that she has never been told that she has COPD but she has been smoking since she was 57 years old and uses that inhaler twice daily.  She says that about a week ago she felt like she might of had a cold and most of the symptoms got better but she is continued to have difficulty breathing.  Exertion makes the symptoms a lot worse and she feels like she cannot catch her breath.  She has had a mild nonproductive cough as well.  She has not had any recent fever chills, chest pain, nausea, vomiting, abdominal pain, nor dysuria.  She has had similar but milder symptoms in the past and she describes her current symptoms as at least moderate in severity, worse with ambulation.  She was noted to be hypoxemic in triage in the upper 80s after walking into the emergency department and she was put on 2 L of oxygen by nasal cannula.  She is in no distress at rest.  She reports that she has a mild sore throat but her nasal congestion and runny nose have resolved.  She has no specific diagnosis of CHF but she takes blood pressure medicine including HCTZ which she says she takes as a fluid pill.  Because it is around the holidays, she admits to dietary indiscretions including eating a lot of salt.  Past Medical History:  Diagnosis Date  . CHF (congestive heart failure) (HCC)    probable, takes HCTZ as a "fluid pill"  . COPD (chronic obstructive pulmonary disease) (HCC)    probable, no specific diagnosis    . Depression    take fluoxetine  . Hypertension   . Morbid obesity (HCC)   . Thyroid disease    hypothryoidism    There are no active problems to display for this patient.   Past Surgical History:  Procedure Laterality Date  . CHOLECYSTECTOMY      Prior to Admission medications   Medication Sig Start Date End Date Taking? Authorizing Provider  albuterol (PROVENTIL HFA;VENTOLIN HFA) 108 (90 Base) MCG/ACT inhaler Inhale 2-4 puffs by mouth every 4 hours as needed for wheezing, cough, and/or shortness of breath 03/01/18   Loleta RoseForbach, Vlada Uriostegui, MD  amoxicillin (AMOXIL) 875 MG tablet Take 1 tablet (875 mg total) by mouth 2 (two) times daily. 03/21/16   Beers, Charmayne Sheerharles M, PA-C  furosemide (LASIX) 20 MG tablet Take 1 tablet (20 mg total) by mouth daily for 7 days. 03/01/18 03/08/18  Loleta RoseForbach, Lossie Kalp, MD  naproxen (NAPROSYN) 500 MG tablet Take 1 tablet (500 mg total) by mouth 2 (two) times daily with a meal. 03/21/16   Beers, Charmayne Sheerharles M, PA-C  predniSONE (DELTASONE) 20 MG tablet Take 3 tablets (60 mg total) by mouth daily. 03/01/18   Loleta RoseForbach, Ave Scharnhorst, MD    Allergies Patient has no known allergies.  History reviewed. No pertinent family history.  Social History Social History   Tobacco Use  . Smoking status: Current Every Day Smoker    Packs/day: 0.50  Types: Cigarettes  . Smokeless tobacco: Current User  . Tobacco comment: since age 85  Substance Use Topics  . Alcohol use: No  . Drug use: No    Review of Systems Constitutional: No fever/chills Eyes: No visual changes. ENT: Mild but improving sore throat.  Recent nasal congestion and rhinorrhea. Cardiovascular: Denies chest pain. Respiratory: Shortness of breath with exertion for about a week as described above. Gastrointestinal: No abdominal pain.  No nausea, no vomiting.  No diarrhea.  No constipation. Genitourinary: Negative for dysuria. Musculoskeletal: Negative for neck pain.  Negative for back pain. Integumentary: Negative for  rash. Neurological: Negative for headaches, focal weakness or numbness.   ____________________________________________   PHYSICAL EXAM:  VITAL SIGNS: ED Triage Vitals  Enc Vitals Group     BP 02/28/18 1923 (!) 135/53     Pulse Rate 02/28/18 1923 78     Resp 02/28/18 1923 20     Temp 02/28/18 1923 97.6 F (36.4 C)     Temp Source 02/28/18 1923 Oral     SpO2 02/28/18 1923 (!) 88 %     Weight 02/28/18 1922 (!) 142.9 kg (315 lb)     Height 02/28/18 1922 1.575 m (5\' 2" )     Head Circumference --      Peak Flow --      Pain Score 02/28/18 1922 0     Pain Loc --      Pain Edu? --      Excl. in GC? --     Constitutional: Alert and oriented. Well appearing and in no acute distress. Eyes: Conjunctivae are normal.  Head: Atraumatic. Nose: No congestion/rhinnorhea. Mouth/Throat: Mucous membranes are moist. Neck: No stridor.  No meningeal signs.   Cardiovascular: Normal rate, regular rhythm. Good peripheral circulation. Grossly normal heart sounds. Respiratory: Normal respiratory effort at rest.  Mild expiratory wheezing throughout lung fields.  Good air movement. Gastrointestinal: Soft and nontender. No distention.  Musculoskeletal: No lower extremity tenderness nor edema; of note, the patient states that her legs are less swollen than usual. No gross deformities of extremities. Neurologic:  Normal speech and language. No gross focal neurologic deficits are appreciated.  Skin:  Skin is warm, dry and intact. No rash noted. Psychiatric: Mood and affect are normal. Speech and behavior are normal.  ____________________________________________   LABS (all labs ordered are listed, but only abnormal results are displayed)  Labs Reviewed  COMPREHENSIVE METABOLIC PANEL - Abnormal; Notable for the following components:      Result Value   Glucose, Bld 116 (*)    Albumin 3.3 (*)    AST 14 (*)    All other components within normal limits  CBC WITH DIFFERENTIAL/PLATELET  TROPONIN I   BRAIN NATRIURETIC PEPTIDE   ____________________________________________  EKG  ED ECG REPORT I, Loleta Rose, the attending physician, personally viewed and interpreted this ECG.  Date: 02/28/2018 EKG Time: 19: 28 Rate: 77 Rhythm: normal sinus rhythm QRS Axis: normal Intervals: Left bundle branch block, otherwise unremarkable ST/T Wave abnormalities: Non-specific ST segment / T-wave changes, but no evidence of acute ischemia. Narrative Interpretation: no evidence of acute ischemia   ____________________________________________  RADIOLOGY I, Loleta Rose, personally viewed and evaluated these images (plain radiographs) as part of my medical decision making, as well as reviewing the written report by the radiologist.  ED MD interpretation: Mild pulmonary vascular congestion  Official radiology report(s): Dg Chest 2 View  Result Date: 02/28/2018 CLINICAL DATA:  Shortness of breath EXAM: CHEST - 2 VIEW  COMPARISON:  03/19/2010 FINDINGS: Trace pleural effusions. Cardiomegaly with vascular congestion and mild interstitial edema. Aortic atherosclerosis. No pneumothorax. IMPRESSION: Cardiomegaly with vascular congestion, mild interstitial edema and trace pleural effusions. Electronically Signed   By: Jasmine PangKim  Fujinaga M.D.   On: 02/28/2018 19:48    ____________________________________________   PROCEDURES  Critical Care performed: No   Procedure(s) performed:   Procedures   ____________________________________________   INITIAL IMPRESSION / ASSESSMENT AND PLAN / ED COURSE  As part of my medical decision making, I reviewed the following data within the electronic MEDICAL RECORD NUMBER History obtained from family, Labs reviewed , EKG interpreted , Old chart reviewed     Differential diagnosis includes, but is not limited to, COPD exacerbation, CHF exacerbation, pneumonia, viral illness, PE, ACS.  The patient is well-appearing and in no distress as long she is not moving around  but she becomes short of breath and mildly hypoxemic with exertion.  I explained to her that although she does not have a specific diagnosis of COPD this is very likely given her tobacco use history and the fact that she uses breathing treatments regularly.  However it does not sound as though she has albuterol as a rescue inhaler.  Her lung sounds are consistent with COPD and I think it is most likely that she had a mild viral illness that is causing a COPD exacerbation, but her chest x-ray is consistent with some pulmonary vascular congestion as well.  Her lab results are all reassuring and within normal limits including the BNP.  The patient does not want to be in the hospital around the holidays and I am hopeful that if we treat her with DuoNeb x3 and prednisone 60 mg as well as furosemide 40 mg p.o. we may be able to help her symptomatically enough that she can be discharged and follow-up at the next available opportunity with the heart failure clinic.  She is agreeable to this plan.  We will do a trial of ambulation with continuous pulse oximeter after the treatments and see if she is still hypoxemic and see how she feels.  Clinical Course as of Mar 02 55  Tue Mar 01, 2018  [id number] The patient ambulated without any difficulty but dropped to 88%.  However, she says that she feels fine, better than before, and wants to go home.  I offered her admission after verifying that we do have some beds available for her but she is adamant that she does not want to stay and she promises she will come back if she gets worse.  I think that is appropriate.  I will give her a short course of furosemide until she can follow-up with the CHF clinic.  I gave strict return precautions and her family agrees with the plan.   [CF]    Clinical Course User Index [CF] Loleta RoseForbach, Giordano Getman, MD    ____________________________________________  FINAL CLINICAL IMPRESSION(S) / ED DIAGNOSES  Final diagnoses:  COPD exacerbation (HCC)   Pulmonary vascular congestion  Hypoxemia     MEDICATIONS GIVEN DURING THIS VISIT:  Medications  ipratropium-albuterol (DUONEB) 0.5-2.5 (3) MG/3ML nebulizer solution 3 mL (3 mLs Nebulization Given 02/28/18 2335)  ipratropium-albuterol (DUONEB) 0.5-2.5 (3) MG/3ML nebulizer solution 3 mL (3 mLs Nebulization Given 02/28/18 2335)  ipratropium-albuterol (DUONEB) 0.5-2.5 (3) MG/3ML nebulizer solution 3 mL (3 mLs Nebulization Given 02/28/18 2335)  predniSONE (DELTASONE) tablet 60 mg (60 mg Oral Given 02/28/18 2334)  furosemide (LASIX) tablet 40 mg (40 mg Oral Given 02/28/18 2334)  ED Discharge Orders         Ordered    furosemide (LASIX) 20 MG tablet  Daily     03/01/18 0052    predniSONE (DELTASONE) 20 MG tablet  Daily     03/01/18 0052    albuterol (PROVENTIL HFA;VENTOLIN HFA) 108 (90 Base) MCG/ACT inhaler     03/01/18 0052    AMB referral to CHF clinic    Comments:  No specific diagnosis of CHF, but takes HCTZ as a "fluid pill".  Recent progressive dyspnea on exertion, 88% spO2 with ambulation, mild interstitial edema and trace bilateral pleural effusions on CXR. Offered admission but she declined. Started on furosemide 20 mg PO daily x 1 week, appreciate close follow up for additional CHF evaluation (cannot find record of echo). Thank you!   03/01/18 0054           Note:  This document was prepared using Dragon voice recognition software and may include unintentional dictation errors.    Loleta Rose, MD 03/01/18 [phone]

## 2018-03-01 MED ORDER — ALBUTEROL SULFATE HFA 108 (90 BASE) MCG/ACT IN AERS: INHALATION_SPRAY | RESPIRATORY_TRACT | 1 refills | Status: DC

## 2018-03-01 MED ORDER — FUROSEMIDE 20 MG PO TABS: 20.0000 mg | ORAL_TABLET | Freq: Every day | ORAL | 0 refills | Status: DC

## 2018-03-01 MED ORDER — PREDNISONE 20 MG PO TABS: 60.0000 mg | ORAL_TABLET | Freq: Every day | ORAL | 0 refills | Status: DC

## 2018-03-01 NOTE — Discharge Instructions (Signed)
As we discussed, we believe you are short of breath with exertion due to both a COPD exacerbation and some extra fluid in your lungs.  We offered admission to the hospital but you prefer to go home which I think is appropriate, but please return immediately if you develop new or worsening symptoms.  Take the medications prescribed including the furosemide 20 mg daily which will help you get rid of some of your extra fluid.  We have sent a message to the [facility] heart failure clinic to see if they can get you in for a close follow-up appointment to determine if additional evaluation or treatment is necessary.

## 2018-03-01 NOTE — ED Notes (Signed)
Ambulated pt a short distance in the halls. Pt O2sat started at 93% RA and dropped to 88%. Assisted pt safely back to bed and placed pt on 2L nasal cannula for comfort.

## 2018-03-07 ENCOUNTER — Telehealth: Payer: Self-pay

## 2018-03-07 NOTE — Telephone Encounter (Signed)
Called and left a message to contact office for appointment

## 2018-03-07 NOTE — Telephone Encounter (Signed)
-----   Message from Delma Freeze, [place] sent at 03/01/2018  9:46 AM EST ----- Regarding: ED referral

## 2018-03-08 NOTE — Telephone Encounter (Signed)
Appointment scheduled for 8:40 on January 10th.  Thank you for the referral.

## 2018-03-11 ENCOUNTER — Ambulatory Visit: Payer: Self-pay | Attending: Family | Admitting: Family

## 2018-03-11 ENCOUNTER — Encounter: Payer: Self-pay | Admitting: Family

## 2018-03-11 VITALS — BP 123/58 | HR 80 | Resp 18 | Ht 62.0 in | Wt 322.4 lb

## 2018-03-11 DIAGNOSIS — F17200 Nicotine dependence, unspecified, uncomplicated: Secondary | ICD-10-CM | POA: Insufficient documentation

## 2018-03-11 DIAGNOSIS — Z9049 Acquired absence of other specified parts of digestive tract: Secondary | ICD-10-CM | POA: Insufficient documentation

## 2018-03-11 DIAGNOSIS — I10 Essential (primary) hypertension: Secondary | ICD-10-CM | POA: Insufficient documentation

## 2018-03-11 DIAGNOSIS — Z79899 Other long term (current) drug therapy: Secondary | ICD-10-CM | POA: Insufficient documentation

## 2018-03-11 DIAGNOSIS — Z720 Tobacco use: Secondary | ICD-10-CM

## 2018-03-11 DIAGNOSIS — R0683 Snoring: Secondary | ICD-10-CM | POA: Insufficient documentation

## 2018-03-11 DIAGNOSIS — F329 Major depressive disorder, single episode, unspecified: Secondary | ICD-10-CM | POA: Insufficient documentation

## 2018-03-11 DIAGNOSIS — I509 Heart failure, unspecified: Secondary | ICD-10-CM | POA: Insufficient documentation

## 2018-03-11 DIAGNOSIS — J449 Chronic obstructive pulmonary disease, unspecified: Secondary | ICD-10-CM | POA: Insufficient documentation

## 2018-03-11 DIAGNOSIS — Z79890 Hormone replacement therapy (postmenopausal): Secondary | ICD-10-CM | POA: Insufficient documentation

## 2018-03-11 DIAGNOSIS — I110 Hypertensive heart disease with heart failure: Secondary | ICD-10-CM | POA: Insufficient documentation

## 2018-03-11 DIAGNOSIS — Z6841 Body Mass Index (BMI) 40.0 and over, adult: Secondary | ICD-10-CM | POA: Insufficient documentation

## 2018-03-11 DIAGNOSIS — F17210 Nicotine dependence, cigarettes, uncomplicated: Secondary | ICD-10-CM | POA: Insufficient documentation

## 2018-03-11 DIAGNOSIS — E039 Hypothyroidism, unspecified: Secondary | ICD-10-CM | POA: Insufficient documentation

## 2018-03-11 DIAGNOSIS — I890 Lymphedema, not elsewhere classified: Secondary | ICD-10-CM | POA: Insufficient documentation

## 2018-03-11 DIAGNOSIS — Z87891 Personal history of nicotine dependence: Secondary | ICD-10-CM | POA: Insufficient documentation

## 2018-03-11 NOTE — Patient Instructions (Addendum)
Begin weighing daily and call for an overnight weight gain of > 2 pounds or a weekly weight gain of >5 pounds.   Low-Sodium Eating Plan Sodium, which is an element that makes up salt, helps you maintain a healthy balance of fluids in your body. Too much sodium can increase your blood pressure and cause fluid and waste to be held in your body. Your health care provider or dietitian may recommend following this plan if you have high blood pressure (hypertension), kidney disease, liver disease, or heart failure. Eating less sodium can help lower your blood pressure, reduce swelling, and protect your heart, liver, and kidneys. What are tips for following this plan? General guidelines  Most people on this plan should limit their sodium intake to 2,000 mg (milligrams) of sodium each day. Reading food labels   The Nutrition Facts label lists the amount of sodium in one serving of the food. If you eat more than one serving, you must multiply the listed amount of sodium by the number of servings.  Choose foods with less than 140 mg of sodium per serving.  Avoid foods with 300 mg of sodium or more per serving. Shopping  Look for lower-sodium products, often labeled as "low-sodium" or "no salt added."  Always check the sodium content even if foods are labeled as "unsalted" or "no salt added".  Buy fresh foods. ? Avoid canned foods and premade or frozen meals. ? Avoid canned, cured, or processed meats  Buy breads that have less than 80 mg of sodium per slice. Cooking  Eat more home-cooked food and less restaurant, buffet, and fast food.  Avoid adding salt when cooking. Use salt-free seasonings or herbs instead of table salt or sea salt. Check with your health care provider or pharmacist before using salt substitutes.  Cook with plant-based oils, such as canola, sunflower, or olive oil. Meal planning  When eating at a restaurant, ask that your food be prepared with less salt or no salt, if  possible.  Avoid foods that contain MSG (monosodium glutamate). MSG is sometimes added to Chinese food, bouillon, and some canned foods. What foods are recommended? The items listed may not be a complete list. Talk with your dietitian about what dietary choices are best for you. Grains Low-sodium cereals, including oats, puffed wheat and rice, and shredded wheat. Low-sodium crackers. Unsalted rice. Unsalted pasta. Low-sodium bread. Whole-grain breads and whole-grain pasta. Vegetables Fresh or frozen vegetables. "No salt added" canned vegetables. "No salt added" tomato sauce and paste. Low-sodium or reduced-sodium tomato and vegetable juice. Fruits Fresh, frozen, or canned fruit. Fruit juice. Meats and other protein foods Fresh or frozen (no salt added) meat, poultry, seafood, and fish. Low-sodium canned tuna and salmon. Unsalted nuts. Dried peas, beans, and lentils without added salt. Unsalted canned beans. Eggs. Unsalted nut butters. Dairy Milk. Soy milk. Cheese that is naturally low in sodium, such as ricotta cheese, fresh mozzarella, or Swiss cheese Low-sodium or reduced-sodium cheese. Cream cheese. Yogurt. Fats and oils Unsalted butter. Unsalted margarine with no trans fat. Vegetable oils such as canola or olive oils. Seasonings and other foods Fresh and dried herbs and spices. Salt-free seasonings. Low-sodium mustard and ketchup. Sodium-free salad dressing. Sodium-free light mayonnaise. Fresh or refrigerated horseradish. Lemon juice. Vinegar. Homemade, reduced-sodium, or low-sodium soups. Unsalted popcorn and pretzels. Low-salt or salt-free chips. What foods are not recommended? The items listed may not be a complete list. Talk with your dietitian about what dietary choices are best for you. Grains Instant hot   cereals. Bread stuffing, pancake, and biscuit mixes. Croutons. Seasoned rice or pasta mixes. Noodle soup cups. Boxed or frozen macaroni and cheese. Regular salted crackers.  Self-rising flour. Vegetables Sauerkraut, pickled vegetables, and relishes. Olives. French fries. Onion rings. Regular canned vegetables (not low-sodium or reduced-sodium). Regular canned tomato sauce and paste (not low-sodium or reduced-sodium). Regular tomato and vegetable juice (not low-sodium or reduced-sodium). Frozen vegetables in sauces. Meats and other protein foods Meat or fish that is salted, canned, smoked, spiced, or pickled. Bacon, ham, sausage, hotdogs, corned beef, chipped beef, packaged lunch meats, salt pork, jerky, pickled herring, anchovies, regular canned tuna, sardines, salted nuts. Dairy Processed cheese and cheese spreads. Cheese curds. Blue cheese. Feta cheese. String cheese. Regular cottage cheese. Buttermilk. Canned milk. Fats and oils Salted butter. Regular margarine. Ghee. Bacon fat. Seasonings and other foods Onion salt, garlic salt, seasoned salt, table salt, and sea salt. Canned and packaged gravies. Worcestershire sauce. Tartar sauce. Barbecue sauce. Teriyaki sauce. Soy sauce, including reduced-sodium. Steak sauce. Fish sauce. Oyster sauce. Cocktail sauce. Horseradish that you find on the shelf. Regular ketchup and mustard. Meat flavorings and tenderizers. Bouillon cubes. Hot sauce and Tabasco sauce. Premade or packaged marinades. Premade or packaged taco seasonings. Relishes. Regular salad dressings. Salsa. Potato and tortilla chips. Corn chips and puffs. Salted popcorn and pretzels. Canned or dried soups. Pizza. Frozen entrees and pot pies. Summary  Eating less sodium can help lower your blood pressure, reduce swelling, and protect your heart, liver, and kidneys.  Most people on this plan should limit their sodium intake to 1,500-2,000 mg (milligrams) of sodium each day.  Canned, boxed, and frozen foods are high in sodium. Restaurant foods, fast foods, and pizza are also very high in sodium. You also get sodium by adding salt to food.  Try to cook at home, eat  more fresh fruits and vegetables, and eat less fast food, canned, processed, or prepared foods. This information is not intended to replace advice given to you by your health care provider. Make sure you discuss any questions you have with your health care provider. Document Released: 08/08/2001 Document Revised: 02/10/2016 Document Reviewed: 02/10/2016 Elsevier Interactive Patient Education  2019 [employer]  

## 2018-03-11 NOTE — Progress Notes (Signed)
Patient ID: Kristie Dorsey, female    DOB: 27-Nov-1960, 58 y.o.   MRN: [id number]  HPI  Kristie Dorsey is a 58 y/o female with a history of HTN, thyroid disease, COPD, depression, current tobacco use and HF.  No EF to report.   Was in the ED 02/28/18 due to shortness of breath possibly related to COPD / HF. Treated and released.   She presents today for her initial visit with a chief complaint of moderate fatigue upon minimal exertion. She describes this as chronic in nature having been present for several months. She has associated cough, shortness of breath, wheezing, chest pain, pedal edema, palpitations, light-headedness and difficulty sleeping along with this. She denies any abdominal distention or weight gain. Notices that she occasionally wakes herself up gasping/snorting and her daughter has heard her snoring before.   Past Medical History:  Diagnosis Date  . CHF (congestive heart failure) (HCC)    probable, takes HCTZ as a "fluid pill"  . COPD (chronic obstructive pulmonary disease) (HCC)    probable, no specific diagnosis  . Depression    take fluoxetine  . Hypertension   . Morbid obesity (HCC)   . Thyroid disease    hypothryoidism   Past Surgical History:  Procedure Laterality Date  . CHOLECYSTECTOMY     History reviewed. No pertinent family history. Social History   Tobacco Use  . Smoking status: Current Every Day Smoker    Packs/day: 0.50    Types: Cigarettes  . Smokeless tobacco: Current User  . Tobacco comment: since age 31  Substance Use Topics  . Alcohol use: No   No Known Allergies Prior to Admission medications   Medication Sig Start Date End Date Taking? Authorizing Provider  albuterol (PROVENTIL HFA;VENTOLIN HFA) 108 (90 Base) MCG/ACT inhaler Inhale 2-4 puffs by mouth every 4 hours as needed for wheezing, cough, and/or shortness of breath 03/01/18  Yes Loleta Rose, MD  buPROPion (WELLBUTRIN) 75 MG tablet Take 75 mg by mouth 2 (two) times  daily.   Yes [provider]  FLUoxetine (PROZAC) 40 MG capsule Take 40 mg by mouth daily.   Yes [provider]  hydrochlorothiazide (HYDRODIURIL) 25 MG tablet Take 25 mg by mouth daily.   Yes [provider]  levothyroxine (SYNTHROID, LEVOTHROID) 25 MCG tablet Take 25 mcg by mouth daily before breakfast.   Yes [provider]  lisinopril (PRINIVIL,ZESTRIL) 20 MG tablet Take 20 mg by mouth daily.   Yes [provider]  traZODone (DESYREL) 100 MG tablet Take 100 mg by mouth at bedtime.   Yes [provider]    Review of Systems  Constitutional: Positive for fatigue (easily). Negative for appetite change.  HENT: Negative for congestion, postnasal drip and sore throat.   Eyes: Negative.   Respiratory: Positive for cough (dry), shortness of breath (with minimal exertion) and wheezing.   Cardiovascular: Positive for chest pain (on occasion), palpitations and leg swelling (ankles).  Gastrointestinal: Negative for abdominal distention and abdominal pain.  Endocrine: Negative.   Genitourinary: Negative.   Musculoskeletal: Positive for back pain (worse in the morning). Negative for neck pain.  Skin: Negative.   Allergic/Immunologic: Negative.   Neurological: Positive for light-headedness. Negative for dizziness.  Hematological: Negative for adenopathy. Does not bruise/bleed easily.  Psychiatric/Behavioral: Positive for sleep disturbance (not sleeping well; sleeping on 2 pillows). Negative for dysphoric mood. The patient is not nervous/anxious.     Vitals:   03/11/18 0835  BP: (!) 123/58  Pulse:  80  Resp: 18  SpO2: 93%  Weight: (!) 322 lb 6 oz (146.2 kg)  Height: 5\' 2"  (1.575 m)   Wt Readings from Last 3 Encounters:  03/11/18 (!) 322 lb 6 oz (146.2 kg)  02/28/18 (!) 315 lb (142.9 kg)  08/13/16 298 lb (135.2 kg)   Lab Results  Component Value Date   CREATININE 0.79 02/28/2018   CREATININE 0.72 02/27/2013    Physical Exam Vitals  signs and nursing note reviewed.  Constitutional:      Appearance: She is well-developed.  HENT:     Head: Normocephalic and atraumatic.  Neck:     Musculoskeletal: Neck supple.     Vascular: No JVD.  Cardiovascular:     Rate and Rhythm: Normal rate and regular rhythm.  Pulmonary:     Effort: Pulmonary effort is normal. No respiratory distress.     Breath sounds: No wheezing or rales.  Abdominal:     Palpations: Abdomen is soft.     Tenderness: There is no abdominal tenderness.  Musculoskeletal:     Right lower leg: She exhibits no tenderness. Edema (1+ pitting) present.     Left lower leg: She exhibits no tenderness. Edema (1+ pitting) present.  Skin:    General: Skin is warm and dry.  Neurological:     General: No focal deficit present.     Mental Status: She is alert and oriented to person, place, and time.  Psychiatric:        Mood and Affect: Mood normal.        Behavior: Behavior normal.     Assessment & Plan:  1: Heart Failure with unknown EF- - NYHA class III - euvolemic today - not weighing daily but does have scales; instructed to weigh daily and call for an overnight weight gain of >2 pounds or a weekly weight gain of >5 pounds - adding lite salt to her food but is trying to limit that. Emphasized the importance of not adding salt to her food and reading food labels so that she can closely follow a 2000mg  sodium diet. Written dietary information was given to her about this - low sodium cookbook was also given to her - echo scheduled for 03/25/2018 - has not received her flu vaccine; encouraged good handwashing - BNP 02/28/18 was 73.0 - CXR 02/28/18 showed cardiomegaly with vascular congestion, mild interstitial edema and trace pleural effusions - symptoms could be more related to newly diagnosed COPD; consider pulmonary rehab after echo results  2: HTN- - BP looks good today - saw PCP @ [facility] yesterday - BMP from 02/28/18 reviewed and showed sodium 136,  potassium 4.3, creatinine 0.79 and GFR >60  3: Tobacco use- - smoking since she was 58 yrs old - trying to decrease consumption - complete cessation discussed for 3 minutes with her  4: Snoring- - sleep study ordered to rule out sleep apnea  5: Lymphedema- - stage 2 - not wearing compression socks and she was instructed to get the socks and put them on every morning with removal at bedtime - encouraged her to also elevate her legs when sitting for long periods of time - unable to exercise much due to current back pain - consider lymphapress compression boots if edema persists  Patient did not bring her medications nor a list. Each medication was verbally reviewed with the patient and she was encouraged to bring the bottles to every visit to confirm accuracy of list.  Return in 6 weeks or  sooner for any questions/problems before then.

## 2018-03-25 ENCOUNTER — Ambulatory Visit
Admission: RE | Admit: 2018-03-25 | Discharge: 2018-03-25 | Disposition: A | Discharge status: Home / Self Care | Payer: Self-pay | Source: Ambulatory Visit | Attending: Family | Admitting: Family

## 2018-03-25 DIAGNOSIS — J449 Chronic obstructive pulmonary disease, unspecified: Secondary | ICD-10-CM | POA: Insufficient documentation

## 2018-03-25 DIAGNOSIS — I50810 Right heart failure, unspecified: Secondary | ICD-10-CM | POA: Insufficient documentation

## 2018-03-25 DIAGNOSIS — I110 Hypertensive heart disease with heart failure: Secondary | ICD-10-CM | POA: Insufficient documentation

## 2018-03-25 DIAGNOSIS — I509 Heart failure, unspecified: Secondary | ICD-10-CM

## 2018-03-25 DIAGNOSIS — E079 Disorder of thyroid, unspecified: Secondary | ICD-10-CM | POA: Insufficient documentation

## 2018-03-25 DIAGNOSIS — I082 Rheumatic disorders of both aortic and tricuspid valves: Secondary | ICD-10-CM | POA: Insufficient documentation

## 2018-03-25 NOTE — Progress Notes (Signed)
*  PRELIMINARY RESULTS* Echocardiogram 2D Echocardiogram has been performed.  Cristela BlueHege, Desarea Ohagan 03/25/2018, 10:48 AM

## 2018-03-28 ENCOUNTER — Telehealth: Payer: Self-pay | Admitting: Family

## 2018-03-28 NOTE — Telephone Encounter (Signed)
Called patient and explained her echo results to her. Due to her severe aortic stenosis that has shown up on her echo will make a cardiology referral for her. She had no preference and since Dr. Juliann Pares read the echo, will make the referral to [facility]. Patient was appreciative of the information.

## 2018-04-14 ENCOUNTER — Encounter
Admission: RE | Disposition: A | Discharge status: Home / Self Care | Payer: Self-pay | Source: Ambulatory Visit | Attending: Internal Medicine

## 2018-04-14 ENCOUNTER — Encounter: Payer: Self-pay | Admitting: *Deleted

## 2018-04-14 ENCOUNTER — Other Ambulatory Visit: Payer: Self-pay

## 2018-04-14 ENCOUNTER — Ambulatory Visit
Admission: RE | Admit: 2018-04-14 | Discharge: 2018-04-14 | Disposition: A | Discharge status: Home / Self Care | Payer: Self-pay | Source: Ambulatory Visit | Attending: Internal Medicine | Admitting: Internal Medicine

## 2018-04-14 DIAGNOSIS — R079 Chest pain, unspecified: Secondary | ICD-10-CM

## 2018-04-14 DIAGNOSIS — Z79899 Other long term (current) drug therapy: Secondary | ICD-10-CM | POA: Insufficient documentation

## 2018-04-14 DIAGNOSIS — Z79890 Hormone replacement therapy (postmenopausal): Secondary | ICD-10-CM | POA: Insufficient documentation

## 2018-04-14 DIAGNOSIS — I10 Essential (primary) hypertension: Secondary | ICD-10-CM | POA: Insufficient documentation

## 2018-04-14 DIAGNOSIS — R609 Edema, unspecified: Secondary | ICD-10-CM | POA: Insufficient documentation

## 2018-04-14 DIAGNOSIS — F17200 Nicotine dependence, unspecified, uncomplicated: Secondary | ICD-10-CM | POA: Insufficient documentation

## 2018-04-14 DIAGNOSIS — Z6841 Body Mass Index (BMI) 40.0 and over, adult: Secondary | ICD-10-CM | POA: Insufficient documentation

## 2018-04-14 DIAGNOSIS — I060 Rheumatic aortic stenosis: Secondary | ICD-10-CM | POA: Insufficient documentation

## 2018-04-14 DIAGNOSIS — E669 Obesity, unspecified: Secondary | ICD-10-CM | POA: Insufficient documentation

## 2018-04-14 DIAGNOSIS — R0602 Shortness of breath: Secondary | ICD-10-CM

## 2018-04-14 DIAGNOSIS — E079 Disorder of thyroid, unspecified: Secondary | ICD-10-CM | POA: Insufficient documentation

## 2018-04-14 DIAGNOSIS — J449 Chronic obstructive pulmonary disease, unspecified: Secondary | ICD-10-CM | POA: Insufficient documentation

## 2018-04-14 HISTORY — PX: LEFT HEART CATH AND CORONARY ANGIOGRAPHY: CATH118249

## 2018-04-14 SURGERY — LEFT HEART CATH AND CORONARY ANGIOGRAPHY
Anesthesia: Moderate Sedation

## 2018-04-14 MED ORDER — MIDAZOLAM HCL 2 MG/2ML IJ SOLN
INTRAMUSCULAR | Status: DC | PRN
  Administered 2018-04-14 (×3): 1 mg via INTRAVENOUS

## 2018-04-14 MED ORDER — SODIUM CHLORIDE 0.9 % IV SOLN: 250.0000 mL | INTRAVENOUS | Status: DC | PRN

## 2018-04-14 MED ORDER — SODIUM CHLORIDE 0.9 % WEIGHT BASED INFUSION
434.1000 mL/h | INTRAVENOUS | Status: AC
  Administered 2018-04-14: 3 mL/kg/h via INTRAVENOUS

## 2018-04-14 MED ORDER — HEPARIN SODIUM (PORCINE) 1000 UNIT/ML IJ SOLN
INTRAMUSCULAR | Status: DC | PRN
  Administered 2018-04-14: 7000 [IU] via INTRAVENOUS

## 2018-04-14 MED ORDER — MIDAZOLAM HCL 2 MG/2ML IJ SOLN
INTRAMUSCULAR | Status: AC
  Filled 2018-04-14: qty 2

## 2018-04-14 MED ORDER — VERAPAMIL HCL 2.5 MG/ML IV SOLN
INTRAVENOUS | Status: AC
  Filled 2018-04-14: qty 2

## 2018-04-14 MED ORDER — SODIUM CHLORIDE 0.9% FLUSH: 3.0000 mL | INTRAVENOUS | Status: DC | PRN

## 2018-04-14 MED ORDER — SODIUM CHLORIDE 0.9% FLUSH: 3.0000 mL | Freq: Two times a day (BID) | INTRAVENOUS | Status: DC

## 2018-04-14 MED ORDER — HEPARIN SODIUM (PORCINE) 1000 UNIT/ML IJ SOLN
INTRAMUSCULAR | Status: AC
  Filled 2018-04-14: qty 1

## 2018-04-14 MED ORDER — ONDANSETRON HCL 4 MG/2ML IJ SOLN: 4.0000 mg | Freq: Four times a day (QID) | INTRAMUSCULAR | Status: DC | PRN

## 2018-04-14 MED ORDER — SODIUM CHLORIDE 0.9 % WEIGHT BASED INFUSION: 1.0000 mL/kg/h | INTRAVENOUS | Status: DC

## 2018-04-14 MED ORDER — FENTANYL CITRATE (PF) 100 MCG/2ML IJ SOLN
INTRAMUSCULAR | Status: AC
  Filled 2018-04-14: qty 2

## 2018-04-14 MED ORDER — HEPARIN (PORCINE) IN NACL 1000-0.9 UT/500ML-% IV SOLN
INTRAVENOUS | Status: AC
  Filled 2018-04-14: qty 1000

## 2018-04-14 MED ORDER — ASPIRIN 81 MG PO CHEW: 81.0000 mg | CHEWABLE_TABLET | ORAL | Status: DC

## 2018-04-14 MED ORDER — VERAPAMIL HCL 2.5 MG/ML IV SOLN
INTRAVENOUS | Status: DC | PRN
  Administered 2018-04-14: 2.5 mg via INTRA_ARTERIAL

## 2018-04-14 MED ORDER — ACETAMINOPHEN 325 MG PO TABS: 650.0000 mg | ORAL_TABLET | ORAL | Status: DC | PRN

## 2018-04-14 MED ORDER — IPRATROPIUM-ALBUTEROL 0.5-2.5 (3) MG/3ML IN SOLN
3.0000 mL | Freq: Four times a day (QID) | RESPIRATORY_TRACT | Status: DC
  Administered 2018-04-14: 3 mL via RESPIRATORY_TRACT

## 2018-04-14 MED ORDER — FENTANYL CITRATE (PF) 100 MCG/2ML IJ SOLN
INTRAMUSCULAR | Status: DC | PRN
  Administered 2018-04-14: 50 ug via INTRAVENOUS
  Administered 2018-04-14: 25 ug via INTRAVENOUS
  Administered 2018-04-14: 50 ug via INTRAVENOUS
  Administered 2018-04-14: 25 ug via INTRAVENOUS

## 2018-04-14 MED ORDER — IPRATROPIUM-ALBUTEROL 0.5-2.5 (3) MG/3ML IN SOLN
RESPIRATORY_TRACT | Status: AC
  Administered 2018-04-14: 3 mL via RESPIRATORY_TRACT
  Filled 2018-04-14: qty 3

## 2018-04-14 SURGICAL SUPPLY — 17 items
CATH INFINITI 5 FR JL3.5 (CATHETERS) ×2 IMPLANT
CATH INFINITI 5FR JL4 (CATHETERS) ×1 IMPLANT
CATH INFINITI 5FR TG (CATHETERS) ×1 IMPLANT
CATH INFINITI JR4 5F (CATHETERS) ×1 IMPLANT
CATH VISTA GUIDE 6FR XBLAD3.5 (CATHETERS) ×2 IMPLANT
CATH VISTA GUIDE 6FR XBLAD4 (CATHETERS) ×2 IMPLANT
DEVICE CLOSURE MYNXGRIP 5F (Vascular Products) ×1 IMPLANT
DEVICE RAD COMP TR BAND LRG (VASCULAR PRODUCTS) ×1 IMPLANT
GLIDESHEATH SLEND SS 6F .021 (SHEATH) ×1 IMPLANT
KIT MANI 3VAL PERCEP (MISCELLANEOUS) ×2 IMPLANT
NEEDLE PERC 18GX7CM (NEEDLE) ×2 IMPLANT
PACK CARDIAC CATH (CUSTOM PROCEDURE TRAY) ×2 IMPLANT
PANNUS RETENTION SYSTEM 2 PAD (MISCELLANEOUS) ×1 IMPLANT
SHEATH AVANTI 5FR X 11CM (SHEATH) ×1 IMPLANT
WIRE GUIDERIGHT .035X150 (WIRE) ×2 IMPLANT
WIRE HITORQ VERSACORE ST 145CM (WIRE) ×1 IMPLANT
WIRE ROSEN-J .035X260CM (WIRE) ×2 IMPLANT

## 2018-04-14 NOTE — Discharge Instructions (Signed)
Groin Insertion Instructions-If you lose feeling or develop tingling or pain in your leg or foot after the procedure, please walk around first.  If the discomfort does not improve , contact your physician and proceed to the nearest emergency room.  Loss of feeling in your leg might mean that a blockage has formed in the artery and this can be appropriately treated.  Limit your activity for the next two days after your procedure.  Avoid stooping, bending, heavy lifting or exertion as this may put pressure on the insertion site.  Resume normal activities in 48 hours.  You may shower after 24 hours but avoid excessive warm water and do not scrub the site.  Remove clear dressing in 48 hours.  If you have had a closure device inserted, do not soak in a tub bath or a hot tub for at least one week. ° °No driving for 48 hours after discharge.  After the procedure, check the insertion site occasionally.  If any oozing occurs or there is apparent swelling, firm pressure over the site will prevent a bruise from forming.  You can not hurt anything by pressing directly on the site.  The pressure stops the bleeding by allowing a small clot to form.  If the bleeding continues after the pressure has been applied for more than 15 minutes, call 911 or go to the nearest emergency room.   ° °The x-ray dye causes you to pass a considerate amount of urine.  For this reason, you will be asked to drink plenty of liquids after the procedure to prevent dehydration.  You may resume you regular diet.  Avoid caffeine products.   ° °For pain at the site of your procedure, take non-aspirin medicines such as Tylenol. ° °Medications: A. Hold Metformin for 48 hours if applicable.  B. Continue taking all your present medications at home unless your doctor prescribes any changes ° ° °Angiogram, Care After °This sheet gives you information about how to care for yourself after your procedure. Your health care provider may also give you more specific  instructions. If you have problems or questions, contact your health care provider. °What can I expect after the procedure? °After the procedure, it is common to have bruising and tenderness at the catheter insertion area. °Follow these instructions at home: °Insertion site care °· Follow instructions from your health care provider about how to take care of your insertion site. Make sure you: °? Wash your hands with soap and water before you change your bandage (dressing). If soap and water are not available, use hand sanitizer. °? Change your dressing as told by your health care provider. °? Leave stitches (sutures), skin glue, or adhesive strips in place. These skin closures may need to stay in place for 2 weeks or longer. If adhesive strip edges start to loosen and curl up, you may trim the loose edges. Do not remove adhesive strips completely unless your health care provider tells you to do that. °· Do not take baths, swim, or use a hot tub until your health care provider approves. °· You may shower 24-48 hours after the procedure or as told by your health care provider. °? Gently wash the site with plain soap and water. °? Pat the area dry with a clean towel. °? Do not rub the site. This may cause bleeding. °· Do not apply powder or lotion to the site. Keep the site clean and dry. °· Check your insertion site every day for signs of   infection. Check for: °? Redness, swelling, or pain. °? Fluid or blood. °? Warmth. °? Pus or a bad smell. °Activity °· Rest as told by your health care provider, usually for 1-2 days. °· Do not lift anything that is heavier than 10 lbs. (4.5 kg) or as told by your health care provider. °· Do not drive for 24 hours if you were given a medicine to help you relax (sedative). °· Do not drive or use heavy machinery while taking prescription pain medicine. °General instructions ° °· Return to your normal activities as told by your health care provider, usually in about a week. Ask your  health care provider what activities are safe for you. °· If the catheter site starts bleeding, lie flat and put pressure on the site. If the bleeding does not stop, get help right away. This is a medical emergency. °· Drink enough fluid to keep your urine clear or pale yellow. This helps flush the contrast dye from your body. °· Take over-the-counter and prescription medicines only as told by your health care provider. °· Keep all follow-up visits as told by your health care provider. This is important. °Contact a health care provider if: °· You have a fever or chills. °· You have redness, swelling, or pain around your insertion site. °· You have fluid or blood coming from your insertion site. °· The insertion site feels warm to the touch. °· You have pus or a bad smell coming from your insertion site. °· You have bruising around the insertion site. °· You notice blood collecting in the tissue around the catheter site (hematoma). The hematoma may be painful to the touch. °Get help right away if: °· You have severe pain at the catheter insertion area. °· The catheter insertion area swells very fast. °· The catheter insertion area is bleeding, and the bleeding does not stop when you hold steady pressure on the area. °· The area near or just beyond the catheter insertion site becomes pale, cool, tingly, or numb. °These symptoms may represent a serious problem that is an emergency. Do not wait to see if the symptoms will go away. Get medical help right away. Call your local emergency services (911 in the [place] Do not drive yourself to the hospital. °Summary °· After the procedure, it is common to have bruising and tenderness at the catheter insertion area. °· After the procedure, it is important to rest and drink plenty of fluids. °· Do not take baths, swim, or use a hot tub until your health care provider says it is okay to do so. You may shower 24-48 hours after the procedure or as told by your health care  provider. °· If the catheter site starts bleeding, lie flat and put pressure on the site. If the bleeding does not stop, get help right away. This is a medical emergency. °This information is not intended to replace advice given to you by your health care provider. Make sure you discuss any questions you have with your health care provider. °Document Released: 09/04/2004 Document Revised: 01/22/2016 Document Reviewed: 01/22/2016 °Elsevier Interactive Patient Education © 2019 [employer] ° °

## 2018-04-18 ENCOUNTER — Encounter: Payer: Self-pay | Admitting: Internal Medicine

## 2018-04-20 NOTE — Progress Notes (Deleted)
Patient ID: Kristie Dorsey, female    DOB: [birth year], 59 y.o.   MRN: [id number]  HPI  Kristie Dorsey is a 58 y/o female with a history of HTN, thyroid disease, COPD, depression, current tobacco use and HF.  Echo report from 03/25/2018 reviewed and showed an EF of 30-35% along with mild AR and severe AS.   Catheterization done 04/14/2018:  There is severe aortic valve stenosis. There is trivial (1+) aortic regurgitation.  Normal left ventricular function of 60%  Normal coronaries  Gradient across the aortic valve was 40 mmHg   Conclusion  normal coronaries  normal left ventricular function  gradient across the aortic valve was 40 mm of mercury mean  Was in the ED 02/28/18 due to shortness of breath possibly related to COPD / HF. Treated and released.   She presents today for a follow-up visit with a chief complaint of   Past Medical History:  Diagnosis Date  . CHF (congestive heart failure) (HCC)    probable, takes HCTZ as a "fluid pill"  . COPD (chronic obstructive pulmonary disease) (HCC)    probable, no specific diagnosis  . Depression    take fluoxetine  . Hypertension   . Morbid obesity (HCC)   . Thyroid disease    hypothryoidism   Past Surgical History:  Procedure Laterality Date  . CHOLECYSTECTOMY    . LEFT HEART CATH AND CORONARY ANGIOGRAPHY N/A 04/14/2018   Procedure: LEFT HEART CATH AND CORONARY ANGIOGRAPHY;  Surgeon: Alwyn Pea, MD;  Location: [facility];  Service: Cardiovascular;  Laterality: N/A;  . TONSILLECTOMY    . TUBAL LIGATION     No family history on file. Social History   Tobacco Use  . Smoking status: Current Every Day Smoker    Packs/day: 0.50    Types: Cigarettes  . Smokeless tobacco: Current User  . Tobacco comment: since age 40  Substance Use Topics  . Alcohol use: No   No Known Allergies   Review of Systems  Constitutional: Positive for fatigue (easily). Negative for appetite change.  HENT: Negative for  congestion, postnasal drip and sore throat.   Eyes: Negative.   Respiratory: Positive for cough (dry), shortness of breath (with minimal exertion) and wheezing.   Cardiovascular: Positive for chest pain (on occasion), palpitations and leg swelling (ankles).  Gastrointestinal: Negative for abdominal distention and abdominal pain.  Endocrine: Negative.   Genitourinary: Negative.   Musculoskeletal: Positive for back pain (worse in the morning). Negative for neck pain.  Skin: Negative.   Allergic/Immunologic: Negative.   Neurological: Positive for light-headedness. Negative for dizziness.  Hematological: Negative for adenopathy. Does not bruise/bleed easily.  Psychiatric/Behavioral: Positive for sleep disturbance (not sleeping well; sleeping on 2 pillows). Negative for dysphoric mood. The patient is not nervous/anxious.       Physical Exam Vitals signs and nursing note reviewed.  Constitutional:      Appearance: She is well-developed.  HENT:     Head: Normocephalic and atraumatic.  Neck:     Musculoskeletal: Neck supple.     Vascular: No JVD.  Cardiovascular:     Rate and Rhythm: Normal rate and regular rhythm.  Pulmonary:     Effort: Pulmonary effort is normal. No respiratory distress.     Breath sounds: No wheezing or rales.  Abdominal:     Palpations: Abdomen is soft.     Tenderness: There is no abdominal tenderness.  Musculoskeletal:     Right lower leg: She exhibits no tenderness.  Edema (1+ pitting) present.     Left lower leg: She exhibits no tenderness. Edema (1+ pitting) present.  Skin:    General: Skin is warm and dry.  Neurological:     General: No focal deficit present.     Mental Status: She is alert and oriented to person, place, and time.  Psychiatric:        Mood and Affect: Mood normal.        Behavior: Behavior normal.     Assessment & Plan:  1: Heart Failure with preserved EF- - NYHA class III - euvolemic today - not weighing daily but does have  scales; instructed to weigh daily and call for an overnight weight gain of >2 pounds or a weekly weight gain of >5 pounds - weight - adding lite salt to her food but is trying to limit that. Emphasized the importance of not adding salt to her food and reading food labels so that she can closely follow a 2000mg  sodium diet.  - catheterization done due to severe aortic stenosis  - has not received her flu vaccine; encouraged good handwashing - BNP 02/28/18 was 73.0 - CXR 02/28/18 showed cardiomegaly with vascular congestion, mild interstitial edema and trace pleural effusions - symptoms could be more related to newly diagnosed COPD; consider pulmonary rehab after echo results  2: HTN- - BP  - saw [facility] yesterday - BMP from 02/28/18 reviewed and showed sodium 136, potassium 4.3, creatinine 0.79 and GFR >60  3: Tobacco use- - smoking since she was 58 yrs old - trying to decrease consumption - complete cessation discussed for 3 minutes with her  4: Snoring- - sleep study ordered to rule out sleep apnea  5: Lymphedema- - stage 2 - not wearing compression socks and she was instructed to get the socks and put them on every morning with removal at bedtime - encouraged her to also elevate her legs when sitting for long periods of time - unable to exercise much due to current back pain - consider lymphapress compression boots if edema persists  Patient did not bring her medications nor a list. Each medication was verbally reviewed with the patient and she was encouraged to bring the bottles to every visit to confirm accuracy of list.

## 2018-04-21 ENCOUNTER — Ambulatory Visit: Payer: Self-pay | Admitting: Family

## 2018-04-26 NOTE — Progress Notes (Signed)
Patient ID: KEALOHA HURTGEN, female    DOB: 1960-03-14, 58 y.o.   MRN: [id number]  HPI  Ms Mcdaniels is a 58 y/o female with a history of HTN, thyroid disease, COPD, depression, current tobacco use and HF.  Echo report from 03/25/2018 reviewed and showed an EF of 30-35% along with mild AR and severe AS.   Catheterization done 04/14/2018:  There is severe aortic valve stenosis. There is trivial (1+) aortic regurgitation.  Normal left ventricular function of 60%  Normal coronaries  Gradient across the aortic valve was 40 mmHg  Conclusion  normal coronaries  normal left ventricular function  gradient across the aortic valve was 40 mm of mercury mean  Was in the ED 02/28/18 due to shortness of breath possibly related to COPD / HF. Treated and released.   She presents today for a follow-up visit with a chief complaint of moderate fatigue upon minimal exertion. She describes this as chronic in nature having been present for many months. She has associated dry cough, shortness of breath, wheezing, chest pain, pedal edema, palpitations, light-headedness and difficulty sleeping along with this. She denies any abdominal distention or weight gain. Is waiting to hear on an appointment for aortic valve surgery.   Past Medical History:  Diagnosis Date  . CHF (congestive heart failure) (HCC)    probable, takes HCTZ as a "fluid pill"  . COPD (chronic obstructive pulmonary disease) (HCC)    probable, no specific diagnosis  . Depression    take fluoxetine  . Hypertension   . Morbid obesity (HCC)   . Thyroid disease    hypothryoidism   Past Surgical History:  Procedure Laterality Date  . CHOLECYSTECTOMY    . LEFT HEART CATH AND CORONARY ANGIOGRAPHY N/A 04/14/2018   Procedure: LEFT HEART CATH AND CORONARY ANGIOGRAPHY;  Surgeon: Alwyn Pea, MD;  Location: [facility];  Service: Cardiovascular;  Laterality: N/A;  . TONSILLECTOMY    . TUBAL LIGATION     No family history on  file. Social History   Tobacco Use  . Smoking status: Current Every Day Smoker    Packs/day: 0.50    Types: Cigarettes  . Smokeless tobacco: Current User  . Tobacco comment: since age 83  Substance Use Topics  . Alcohol use: No   No Known Allergies  Past Medical History:  Diagnosis Date  . CHF (congestive heart failure) (HCC)    probable, takes HCTZ as a "fluid pill"  . COPD (chronic obstructive pulmonary disease) (HCC)    probable, no specific diagnosis  . Depression    take fluoxetine  . Hypertension   . Morbid obesity (HCC)   . Thyroid disease    hypothryoidism   Past Surgical History:  Procedure Laterality Date  . CHOLECYSTECTOMY    . LEFT HEART CATH AND CORONARY ANGIOGRAPHY N/A 04/14/2018   Procedure: LEFT HEART CATH AND CORONARY ANGIOGRAPHY;  Surgeon: Alwyn Pea, MD;  Location: [facility];  Service: Cardiovascular;  Laterality: N/A;  . TONSILLECTOMY    . TUBAL LIGATION     No family history on file. Social History   Tobacco Use  . Smoking status: Current Every Day Smoker    Packs/day: 0.50    Types: Cigarettes  . Smokeless tobacco: Current User  . Tobacco comment: since age 76  Substance Use Topics  . Alcohol use: No   No Known Allergies Prior to Admission medications   Medication Sig Start Date End Date Taking? Authorizing Provider  albuterol (PROVENTIL HFA;VENTOLIN HFA) 108 (90 Base) MCG/ACT inhaler Inhale 2-4 puffs by mouth every 4 hours as needed for wheezing, cough, and/or shortness of breath Patient taking differently: Inhale 2 puffs into the lungs every 4 (four) hours as needed for wheezing or shortness of breath. Inhale 2-4 puffs by mouth every 4 hours as needed for wheezing, cough, and/or shortness of breath 03/01/18  Yes Loleta Rose, MD  buPROPion (WELLBUTRIN XL) 150 MG 24 hr tablet Take 150 mg by mouth daily.   Yes [provider]  FLUoxetine (PROZAC) 40 MG capsule Take 40 mg by mouth daily.   Yes [provider]  furosemide (LASIX) 20 MG tablet Take 20 mg by mouth daily.   Yes [provider]  hydrochlorothiazide (HYDRODIURIL) 25 MG tablet Take 25 mg by mouth daily.   Yes [provider]  levothyroxine (SYNTHROID, LEVOTHROID) 75 MCG tablet Take 75 mcg by mouth daily before breakfast.    Yes [provider]  lisinopril (PRINIVIL,ZESTRIL) 10 MG tablet Take 10 mg by mouth daily.    Yes [provider]  meloxicam (MOBIC) 15 MG tablet Take 15 mg by mouth daily.   Yes [provider]  metoprolol succinate (TOPROL-XL) 25 MG 24 hr tablet Take 25 mg by mouth daily.   Yes [provider]    Review of Systems  Constitutional: Positive for fatigue (easily). Negative for appetite change.  HENT: Negative for congestion, postnasal drip and sore throat.   Eyes: Negative.   Respiratory: Positive for cough (dry), shortness of breath (with minimal exertion) and wheezing.   Cardiovascular: Positive for chest pain (on occasion), palpitations and leg swelling (ankles).  Gastrointestinal: Positive for nausea. Negative for abdominal distention and abdominal pain.  Endocrine: Negative.   Genitourinary: Negative.   Musculoskeletal: Positive for back pain (worse in the morning). Negative for neck pain.  Skin: Negative.   Allergic/Immunologic: Negative.   Neurological: Positive for light-headedness. Negative for dizziness.  Hematological: Negative for adenopathy. Does not bruise/bleed easily.  Psychiatric/Behavioral: Positive for sleep disturbance (not sleeping well; sleeping on 2 pillows). Negative for dysphoric mood. The patient is not nervous/anxious.    Vitals:   04/27/18 0946  BP: 100/60  Pulse: 79  Resp: 18  SpO2: 94%  Weight: (!) 322 lb 2 oz (146.1 kg)  Height: 5\' 2"  (1.575 m)   Wt Readings from Last 3 Encounters:  04/27/18 (!) 322 lb 2 oz (146.1 kg)  04/14/18 (!) 319 lb (144.7 kg)  03/11/18 (!) 322 lb 6 oz (146.2 kg)   Lab Results  Component Value  Date   CREATININE 0.79 02/28/2018   CREATININE 0.72 02/27/2013     Physical Exam Vitals signs and nursing note reviewed.  Constitutional:      Appearance: She is well-developed.  HENT:     Head: Normocephalic and atraumatic.  Neck:     Musculoskeletal: Neck supple.     Vascular: No JVD.  Cardiovascular:     Rate and Rhythm: Normal rate and regular rhythm.  Pulmonary:     Effort: Pulmonary effort is normal. No respiratory distress.     Breath sounds: No wheezing or rales.  Abdominal:     Palpations: Abdomen is soft.     Tenderness: There is no abdominal tenderness.  Musculoskeletal:     Right lower leg: She exhibits no tenderness. Edema (trace pitting) present.     Left lower leg: She exhibits no tenderness. Edema (trace pitting) present.  Skin:    General: Skin is  warm and dry.  Neurological:     General: No focal deficit present.     Mental Status: She is alert and oriented to person, place, and time.  Psychiatric:        Mood and Affect: Mood normal.        Behavior: Behavior normal.    Assessment & Plan:  1: Heart Failure with preserved EF- - NYHA class III - euvolemic today - weighing daily; reminded to call for an overnight weight gain of >2 pounds or a weekly weight gain of >5 pounds - weight unchanged from last visit here 6 weeks ago (says by her home scale, she's lost 5 pounds) - adding "very little" lite salt to her food but is trying to limit that. Emphasized the importance of not adding salt to her food and reading food labels so that she can closely follow a 2000mg  sodium diet.  - catheterization done due to severe aortic stenosis - saw cardiology Juliann Pares([place]) 04/21/2018 - does not get the flu vaccine; encouraged good handwashing - BNP 02/28/18 was 73.0 - PharmD reconciled medications with the patient  2: HTN- - BP on the low side today - will stop HCTZ since she's now on furosemide - [facility]  - BMP from 02/28/18 reviewed and showed sodium  136, potassium 4.3, creatinine 0.79 and GFR >60  3: Tobacco use- - smoking since she was 58 yrs old - trying to decrease consumption - complete cessation discussed for 3 minutes with her  4: Aortic Stenosis- - will stop lisinopril due to aortic stenosis - she says that she's waiting to hear about an appointment to see when her valve surgery will be. Told her to call cardiology office and just ask for an update  5: Lymphedema- - resolved  Patient did not bring her medications nor a list. Each medication was verbally reviewed with the patient and she was encouraged to bring the bottles to every visit to confirm accuracy of list.  Return in 2 months or sooner for any questions/problems before then.

## 2018-04-27 ENCOUNTER — Ambulatory Visit: Payer: Self-pay | Attending: Family | Admitting: Family

## 2018-04-27 ENCOUNTER — Encounter: Payer: Self-pay | Admitting: Pharmacist

## 2018-04-27 ENCOUNTER — Encounter: Payer: Self-pay | Admitting: Family

## 2018-04-27 VITALS — BP 100/60 | HR 79 | Resp 18 | Ht 62.0 in | Wt 322.1 lb

## 2018-04-27 DIAGNOSIS — I890 Lymphedema, not elsewhere classified: Secondary | ICD-10-CM

## 2018-04-27 DIAGNOSIS — Z791 Long term (current) use of non-steroidal anti-inflammatories (NSAID): Secondary | ICD-10-CM | POA: Insufficient documentation

## 2018-04-27 DIAGNOSIS — Z720 Tobacco use: Secondary | ICD-10-CM

## 2018-04-27 DIAGNOSIS — Z9049 Acquired absence of other specified parts of digestive tract: Secondary | ICD-10-CM | POA: Insufficient documentation

## 2018-04-27 DIAGNOSIS — J449 Chronic obstructive pulmonary disease, unspecified: Secondary | ICD-10-CM | POA: Insufficient documentation

## 2018-04-27 DIAGNOSIS — Z79890 Hormone replacement therapy (postmenopausal): Secondary | ICD-10-CM | POA: Insufficient documentation

## 2018-04-27 DIAGNOSIS — I509 Heart failure, unspecified: Secondary | ICD-10-CM | POA: Insufficient documentation

## 2018-04-27 DIAGNOSIS — F329 Major depressive disorder, single episode, unspecified: Secondary | ICD-10-CM | POA: Insufficient documentation

## 2018-04-27 DIAGNOSIS — E039 Hypothyroidism, unspecified: Secondary | ICD-10-CM | POA: Insufficient documentation

## 2018-04-27 DIAGNOSIS — Z79899 Other long term (current) drug therapy: Secondary | ICD-10-CM | POA: Insufficient documentation

## 2018-04-27 DIAGNOSIS — I5022 Chronic systolic (congestive) heart failure: Secondary | ICD-10-CM | POA: Insufficient documentation

## 2018-04-27 DIAGNOSIS — I350 Nonrheumatic aortic (valve) stenosis: Secondary | ICD-10-CM | POA: Insufficient documentation

## 2018-04-27 DIAGNOSIS — I5032 Chronic diastolic (congestive) heart failure: Secondary | ICD-10-CM

## 2018-04-27 DIAGNOSIS — I10 Essential (primary) hypertension: Secondary | ICD-10-CM

## 2018-04-27 DIAGNOSIS — Z6841 Body Mass Index (BMI) 40.0 and over, adult: Secondary | ICD-10-CM | POA: Insufficient documentation

## 2018-04-27 DIAGNOSIS — F17210 Nicotine dependence, cigarettes, uncomplicated: Secondary | ICD-10-CM | POA: Insufficient documentation

## 2018-04-27 DIAGNOSIS — I110 Hypertensive heart disease with heart failure: Secondary | ICD-10-CM | POA: Insufficient documentation

## 2018-04-27 NOTE — Patient Instructions (Addendum)
Continue weighing daily and call for an overnight weight gain of > 2 pounds or a weekly weight gain of >5 pounds.  Stop taking lisinopril and hydrochlorithiazide

## 2018-04-27 NOTE — Progress Notes (Signed)
Kristie Dorsey - PHARMACIST COUNSELING NOTE  ADHERENCE ASSESSMENT  Adherence strategy: pill box   Do you ever forget to take your medication? [x] Yes (1) [] No (0)  Do you ever skip doses due to side effects? [] Yes (1) [x] No (0)  Do you have trouble affording your medicines? [x] Yes (1) [] No (0)  Are you ever unable to pick up your medication due to transportation difficulties? [] Yes (1) [x] No (0)  Do you ever stop taking your medications because you don't believe they are helping? [] Yes (1) [x] No (0)  Total score _2______    Recommendations given to patient about increasing adherence: Patient reports that she does not typically miss doses, maybe occasionally. She may have cost issues occasionally but usually does not run out of her medications.  Guideline-Directed Medical Therapy/Evidence Based Medicine    ACE/ARB/ARNI: lisinopril 10 mg daily   Beta Blocker: metoprolol succinate 25 mg daily   Aldosterone Antagonist: none (HFpEF) Diuretic: furosemide 20 mg daily, HCTZ 25 mg daily    SUBJECTIVE   HPI: Here for follow up visit. Recently had a cath that showed severe aortic stenosis. Trying to have surgery to correct. PCP has her on two BP meds and Dr. Clayborn Bigness added two more. Concerned about taking all of them. Reports episodes of dizziness and nausea.  Past Medical History:  Diagnosis Date  . CHF (congestive heart failure) (HCC)    probable, takes HCTZ as a "fluid pill"  . COPD (chronic obstructive pulmonary disease) (HCC)    probable, no specific diagnosis  . Depression    take fluoxetine  . Hypertension   . Morbid obesity ([place])   . Thyroid disease    hypothryoidism        OBJECTIVE    Vital signs: HR 79, weight (pounds) 322.2  ECHO: Date 03/25/18, EF 30-35%  Cath: Date 04/14/18, EF 60%  BMP Latest Ref Rng & Units 02/28/2018 02/27/2013  Glucose 70 - 99 mg/dL 116(H) 100(H)  BUN 6 - 20 mg/dL 14 11  Creatinine 0.44 - 1.00 mg/dL  0.79 0.72  Sodium 135 - 145 mmol/L 136 136  Potassium 3.5 - 5.1 mmol/L 4.3 4.2  Chloride 98 - 111 mmol/L 99 105  CO2 22 - 32 mmol/L 31 32  Calcium 8.9 - 10.3 mg/dL 8.9 9.1    ASSESSMENT 58 year old female with HFpEF and severe aortic stenosis. Concerned today about all of her BP medications that have been added by different doctors. She reports feeling dizzy and nauseas as well as having a dry cough. Reports she has had increased voiding since starting Lasix. NP checked BP and it was low.   PLAN We discussed that all of her BP medications were not necessarily unsafe to take together but that she may not need all of them, especially if she is experiencing hypotension. Lisinopril and HCTZ discontinued for low BP and aortic stenosis. She will continue on metoprolol and furosemide. Encouraged her to continue weighing herself and watching sodium intake.    Time spent: 10 minutes  [place], Pharm.D. 04/27/2018 10:18 AM    Current Outpatient Medications:  .  albuterol (PROVENTIL HFA;VENTOLIN HFA) 108 (90 Base) MCG/ACT inhaler, Inhale 2-4 puffs by mouth every 4 hours as needed for wheezing, cough, and/or shortness of breath (Patient taking differently: Inhale 2 puffs into the lungs every 4 (four) hours as needed for wheezing or shortness of breath. Inhale 2-4 puffs by mouth every 4 hours as needed for wheezing, cough, and/or shortness  of breath), Disp: 1 Inhaler, Rfl: 1 .  buPROPion (WELLBUTRIN XL) 150 MG 24 hr tablet, Take 150 mg by mouth daily., Disp: , Rfl:  .  FLUoxetine (PROZAC) 40 MG capsule, Take 40 mg by mouth daily., Disp: , Rfl:  .  furosemide (LASIX) 20 MG tablet, Take 20 mg by mouth daily., Disp: , Rfl:  .  hydrochlorothiazide (HYDRODIURIL) 25 MG tablet, Take 25 mg by mouth daily., Disp: , Rfl:  .  levothyroxine (SYNTHROID, LEVOTHROID) 75 MCG tablet, Take 75 mcg by mouth daily before breakfast. , Disp: , Rfl:  .  meloxicam (MOBIC) 15 MG tablet, Take 15 mg by mouth daily.,  Disp: , Rfl:  .  metoprolol succinate (TOPROL-XL) 25 MG 24 hr tablet, Take 25 mg by mouth daily., Disp: , Rfl:    COUNSELING POINTS/CLINICAL PEARLS  Metoprolol Succinate (Goal: 200 mg once daily) Warn patient to avoid activities requiring mental alertness or coordination until drug effects are realized, as drug may cause dizziness. Tell patient planning major surgery with anesthesia to alert physician that drug is being used, as drug impairs ability of heart to respond to reflex adrenergic stimuli. Drug may cause diarrhea, fatigue, headache, or depression. Advise diabetic patient to carefully monitor blood glucose as drug may mask symptoms of hypoglycemia. Patient should take extended-release tablet with or immediately following meals. Counsel patient against sudden discontinuation of drug, as this may precipitate hypertension, angina, or myocardial infarction. In the event of a missed dose, counsel patient to skip the missed dose and maintain a regular dosing schedule. Furosemide  Drug causes sun-sensitivity. Advise patient to use sunscreen and avoid tanning beds. Patient should avoid activities requiring coordination until drug effects are realized, as drug may cause dizziness, vertigo, or blurred vision. This drug may cause hyperglycemia, hyperuricemia, constipation, diarrhea, loss of appetite, nausea, vomiting, purpuric disorder, cramps, spasticity, asthenia, headache, paresthesia, or scaling eczema. Instruct patient to report unusual bleeding/bruising or signs/symptoms of hypotension, infection, pancreatitis, or ototoxicity (tinnitus, hearing impairment). Advise patient to report signs/symptoms of a severe skin reactions (flu-like symptoms, spreading red rash, or skin/mucous membrane blistering) or erythema multiforme. Instruct patient to eat high-potassium foods during drug therapy, as directed by healthcare professional.  Patient should not drink alcohol while taking this drug.   DRUGS  TO AVOID IN HEART FAILURE  Drug or Class Mechanism  Analgesics . NSAIDs . COX-2 inhibitors . Glucocorticoids  Sodium and water retention, increased systemic vascular resistance, decreased response to diuretics   Diabetes Medications . Metformin . Thiazolidinediones o Rosiglitazone (Avandia) o Pioglitazone (Actos) . DPP4 Inhibitors o Saxagliptin (Onglyza) o Sitagliptin (Januvia)   Lactic acidosis Possible calcium channel blockade   Unknown  Antiarrhythmics . Class I  o Flecainide o Disopyramide . Class III o Sotalol . Other o Dronedarone  Negative inotrope, proarrhythmic   Proarrhythmic, beta blockade  Negative inotrope  Antihypertensives . Alpha Blockers o Doxazosin . Calcium Channel Blockers o Diltiazem o Verapamil o Nifedipine . Central Alpha Adrenergics o Moxonidine . Peripheral Vasodilators o Minoxidil  Increases renin and aldosterone  Negative inotrope    Possible sympathetic withdrawal  Unknown  Anti-infective . Itraconazole . Amphotericin B  Negative inotrope Unknown  Hematologic . Anagrelide . Cilostazol   Possible inhibition of PD IV Inhibition of PD III causing arrhythmias  Neurologic/Psychiatric . Stimulants . Anti-Seizure Drugs o Carbamazepine o Pregabalin . Antidepressants o Tricyclics o Citalopram . Parkinsons o Bromocriptine o Pergolide o Pramipexole . Antipsychotics o Clozapine . Antimigraine o Ergotamine o Methysergide . Appetite suppressants .  Bipolar o Lithium  Peripheral alpha and beta agonist activity  Negative inotrope and chronotrope Calcium channel blockade  Negative inotrope, proarrhythmic Dose-dependent QT prolongation  Excessive serotonin activity/valvular damage Excessive serotonin activity/valvular damage Unknown  IgE mediated hypersensitivy, calcium channel blockade  Excessive serotonin activity/valvular damage Excessive serotonin activity/valvular damage Valvular damage  Direct  myofibrillar degeneration, adrenergic stimulation  Antimalarials . Chloroquine . Hydroxychloroquine Intracellular inhibition of lysosomal enzymes  Urologic Agents . Alpha Blockers o Doxazosin o Prazosin o Tamsulosin o Terazosin  Increased renin and aldosterone  Adapted from Page RL, et al. "Drugs That May Cause or Exacerbate Heart Failure: A Scientific Statement from the American Heart  Association." Circulation 2016; 134:e32-e69. DOI: 10.1161/CIR.0000000000000426   MEDICATION ADHERENCES TIPS AND STRATEGIES 1. Taking medication as prescribed improves patient outcomes in heart failure (reduces hospitalizations, improves symptoms, increases survival) 2. Side effects of medications can be managed by decreasing doses, switching agents, stopping drugs, or adding additional therapy. Please let someone in the [facility] Failure Clinic know if you have having bothersome side effects so we can modify your regimen. Do not alter your medication regimen without talking to [place].  3. Medication reminders can help patients remember to take drugs on time. If you are missing or forgetting doses you can try linking behaviors, using pill boxes, or an electronic reminder like an alarm on your phone or an app. Some people can also get automated phone calls as medication reminders.

## 2018-05-04 ENCOUNTER — Encounter: Payer: Self-pay | Admitting: Family

## 2018-06-13 ENCOUNTER — Ambulatory Visit: Payer: Self-pay

## 2018-06-28 ENCOUNTER — Other Ambulatory Visit: Payer: Self-pay

## 2018-06-28 ENCOUNTER — Ambulatory Visit: Payer: MEDICAID | Attending: Family | Admitting: Family

## 2018-06-28 VITALS — Wt 325.0 lb

## 2018-06-28 DIAGNOSIS — I5032 Chronic diastolic (congestive) heart failure: Secondary | ICD-10-CM

## 2018-06-28 DIAGNOSIS — I350 Nonrheumatic aortic (valve) stenosis: Secondary | ICD-10-CM

## 2018-06-28 DIAGNOSIS — I10 Essential (primary) hypertension: Secondary | ICD-10-CM

## 2018-06-28 NOTE — Patient Instructions (Signed)
Continue weighing daily and call for an overnight weight gain of > 2 pounds or a weekly weight gain of >5 pounds. 

## 2018-06-28 NOTE — Progress Notes (Signed)
Virtual Visit via Telephone Note    Evaluation Performed:  Follow-up visit  This visit type was conducted due to national recommendations for restrictions regarding the COVID-19 Pandemic (e.g. social distancing).  This format is felt to be most appropriate for this patient at this time.  All issues noted in this document were discussed and addressed.  No physical exam was performed (except for noted visual exam findings with Video Visits).  Please refer to the patient's chart (MyChart message for video visits and phone note for telephone visits) for the patient's consent to telehealth for [facility]  Date:  06/28/2018   ID:  Kristie Dorsey, DOB 07-Apr-1960, MRN [id number]  Patient Location:  [address] [place] [address]   Provider location:   [facility] [address] [place], [place] [address]  PCP:  [facility]  Cardiologist:  Dorothyann Peng, MD Electrophysiologist:  None   Chief Complaint:  Moderate fatigue  History of Present Illness:    Kristie Dorsey is a 58 y.o. female who presents via audio/video conferencing for a telehealth visit today.  Patient verified DOB and address.  The patient does not have symptoms concerning for COVID-19 infection (fever, chills, cough, or new SHORTNESS OF BREATH).   Patient reports moderate fatigue upon minimal exertion. She describes this as chronic in nature having been present for several months. She has associated shortness of breath, intermittent chest pain/ palpitations, occasional pedal edema and dizziness along with this. She denies any abdominal distention, cough, rhinorrhea or weight change. She is under quite a bit of stress because she's been trying to get medicaid and she's frustrated with the lack of communication from her cardiologist regarding the plan for TAVR in the future. She thinks the stress is contributing to some diarrhea that she's been having.    Prior CV studies:   The following studies were reviewed today:  Echo report from 03/25/2018 reviewed and showed an EF of 30-35% along with mild AR and severe AS with AoV area of < 0.4cm2  Catheterization done 04/14/2018 showed normal coronaries and severe AS. EF was 60%.   Past Medical History:  Diagnosis Date  . CHF (congestive heart failure) (HCC)    probable, takes HCTZ as a "fluid pill"  . COPD (chronic obstructive pulmonary disease) (HCC)    probable, no specific diagnosis  . Depression    take fluoxetine  . Hypertension   . Morbid obesity (HCC)   . Thyroid disease    hypothryoidism   Past Surgical History:  Procedure Laterality Date  . CHOLECYSTECTOMY    . LEFT HEART CATH AND CORONARY ANGIOGRAPHY N/A 04/14/2018   Procedure: LEFT HEART CATH AND CORONARY ANGIOGRAPHY;  Surgeon: Alwyn Pea, MD;  Location: [facility] INVASIVE CV LAB;  Service: Cardiovascular;  Laterality: N/A;  . TONSILLECTOMY    . TUBAL LIGATION       Current Meds  Medication Sig  . albuterol (PROVENTIL HFA;VENTOLIN HFA) 108 (90 Base) MCG/ACT inhaler Inhale 2-4 puffs by mouth every 4 hours as needed for wheezing, cough, and/or shortness of breath (Patient taking differently: Inhale 2 puffs into the lungs every 4 (four) hours as needed for wheezing or shortness of breath. Inhale 2-4 puffs by mouth every 4 hours as needed for wheezing, cough, and/or shortness of breath)  . buPROPion (WELLBUTRIN XL) 150 MG 24 hr tablet Take 150 mg by mouth daily.  Marland Kitchen FLUoxetine (PROZAC) 40 MG capsule Take 40 mg  by mouth daily.  . furosemide (LASIX) 20 MG tablet Take 20 mg by mouth daily.  Marland Kitchen levothyroxine (SYNTHROID, LEVOTHROID) 75 MCG tablet Take 75 mcg by mouth daily before breakfast.   . meloxicam (MOBIC) 15 MG tablet Take 15 mg by mouth daily.  . metoprolol succinate (TOPROL-XL) 25 MG 24 hr tablet Take 25 mg by mouth daily.  . traZODone (DESYREL) 100 MG tablet Take 100 mg by mouth at bedtime.     Allergies:   Patient has  no known allergies.   Social History   Tobacco Use  . Smoking status: Current Every Day Smoker    Packs/day: 0.50    Types: Cigarettes  . Smokeless tobacco: Current User  . Tobacco comment: since age 62  Substance Use Topics  . Alcohol use: No  . Drug use: No     Family Hx: The patient's family history is not on file.  ROS:   Please see the history of present illness.     All other systems reviewed and are negative.   Labs/Other Tests and Data Reviewed:    Recent Labs: 02/28/2018: ALT 15; B Natriuretic Peptide 73.0; BUN 14; Creatinine, Ser 0.79; Hemoglobin 12.0; Platelets 175; Potassium 4.3; Sodium 136   Recent Lipid Panel No results found for: CHOL, TRIG, HDL, CHOLHDL, LDLCALC, LDLDIRECT  Wt Readings from Last 3 Encounters:  06/28/18 (!) 325 lb (147.4 kg)  04/27/18 (!) 322 lb 2 oz (146.1 kg)  04/14/18 (!) 319 lb (144.7 kg)     Exam:    Vital Signs:  Wt (!) 325 lb (147.4 kg) Comment: self-reported  BMI 59.44 kg/m    Well nourished, well developed female in no  acute distress.   ASSESSMENT & PLAN:    1. Chronic heart failure with preserved ejection fraction- - NYHA class III - euvolemic today based on patient's description of symptoms - weighing daily and she says that her weight has been stable. Reminded to call for an overnight weight gain of >2 pounds or a weekly weight gain of >5 pounds - not adding salt to her food and is trying to closely follow a low sodium diet as she recognizes that she develops swelling around her ankles if she takes in too much sodium - saw cardiology Juliann Pares) 04/21/2018  2: HTN- - not checking her BP at home - gets primary care at [facility] - BMP from 04/06/2018 reviewed and showed sodium 137, potassium 4.3, creatinine 0.9 and GFR 64  3: Severe aortic stenosis-  - patient says that she called her cardiologist's office ~ 2 weeks ago regarding plan for TAVR and hasn't heard anything back from them - she  voices frustration and stress regarding this as she wants to get this taken care of - encouraged her to call her cardiologist's office back and leave another message regarding this. Emotional support given as well   COVID-19 Education: The signs and symptoms of COVID-19 were discussed with the patient and how to seek care for testing (follow up with PCP or arrange E-visit).  The importance of social distancing was discussed today.  Patient Risk:   After full review of this patients clinical status, I feel that they are at least moderate risk at this time.  Time:   Today, I have spent 11 minutes with the patient with telehealth technology discussing medications, daily weight, health status and symptoms to report.     Medication Adjustments/Labs and Tests Ordered: Current medicines are reviewed at length with the  patient today.  Concerns regarding medicines are outlined above.   Tests Ordered: No orders of the defined types were placed in this encounter.  Medication Changes: No orders of the defined types were placed in this encounter.   Disposition:  Follow-up in 2 months or sooner for any questions/problems before then.   Signed, Delma Freezeina A Donesha Wallander, FNP  06/28/2018 12:26 PM    [facility]

## 2018-06-29 ENCOUNTER — Ambulatory Visit: Payer: Self-pay | Admitting: Family

## 2018-08-30 ENCOUNTER — Ambulatory Visit: Payer: MEDICAID | Attending: Family | Admitting: Family

## 2018-08-30 ENCOUNTER — Other Ambulatory Visit: Payer: Self-pay

## 2018-08-30 ENCOUNTER — Encounter: Payer: Self-pay | Admitting: Family

## 2018-08-30 VITALS — BP 105/70 | Wt 320.0 lb

## 2018-08-30 DIAGNOSIS — I350 Nonrheumatic aortic (valve) stenosis: Secondary | ICD-10-CM

## 2018-08-30 DIAGNOSIS — I10 Essential (primary) hypertension: Secondary | ICD-10-CM

## 2018-08-30 DIAGNOSIS — I5032 Chronic diastolic (congestive) heart failure: Secondary | ICD-10-CM

## 2018-08-30 NOTE — Progress Notes (Signed)
Virtual Visit via Telephone Note   Evaluation Performed:  Follow-up visit  This visit type was conducted due to national recommendations for restrictions regarding the COVID-19 Pandemic (e.g. social distancing).  This format is felt to be most appropriate for this patient at this time.  All issues noted in this document were discussed and addressed.  No physical exam was performed (except for noted visual exam findings with Video Visits).  Please refer to the patient's chart (MyChart message for video visits and phone note for telephone visits) for the patient's consent to [facility]  Date:  08/30/2018   ID:  Kristie Dorsey, DOB 18-Nov-1960, MRN [id number]  Patient Location:  [address] [place] [address]   Provider location:   [facility] [address] [place], [place] [address]  [facility]:  [facility]  Cardiologist:  Dorothyann Pengallwood, Dwayne, MD Electrophysiologist:  None   Chief Complaint:  fatigue  History of Present Illness:    Kristie SquiresJudy M Dorsey is a [age] y.o. female who presents via audio/video conferencing for a telehealth visit today.  Patient verified DOB and address.  The patient does not have symptoms concerning for COVID-19 infection (fever, chills, cough, or new SHORTNESS OF BREATH).   Patient reports moderate fatigue upon minimal exertion. She describes this as chronic in nature having been present for several months although she does feel like it's getting worse. She has associated shortness of breath along with this. She says that some days, she can only take 3 steps before getting short of breath. She denies any dizziness, swelling in her legs/ abdomen, palpitations, chest pain, difficulty sleeping or weight gain.  [facility] and discuss possible TAVR.  Prior CV studies:   The following studies were reviewed today:  Echo report from 03/25/2018 reviewed and  showed an EF of 30-35% along with mild MR and severe AS with AoV area of >0.4cm2  Past Medical History:  Diagnosis Date  . CHF (congestive heart failure) (HCC)    probable, takes HCTZ as a "fluid pill"  . COPD (chronic obstructive pulmonary disease) (HCC)    probable, no specific diagnosis  . Depression    take fluoxetine  . Hypertension   . Morbid obesity (HCC)   . Thyroid disease    hypothryoidism   Past Surgical History:  Procedure Laterality Date  . CHOLECYSTECTOMY    . LEFT HEART CATH AND CORONARY ANGIOGRAPHY N/A 04/14/2018   Procedure: LEFT HEART CATH AND CORONARY ANGIOGRAPHY;  Surgeon: Alwyn Peaallwood, Dwayne D, MD;  Location: [facility] INVASIVE CV LAB;  Service: Cardiovascular;  Laterality: N/A;  . TONSILLECTOMY    . TUBAL LIGATION       Current Meds  Medication Sig  . albuterol (PROVENTIL HFA;VENTOLIN HFA) 108 (90 Base) MCG/ACT inhaler Inhale 2-4 puffs by mouth every 4 hours as needed for wheezing, cough, and/or shortness of breath (Patient taking differently: Inhale 2 puffs into the lungs every 4 (four) hours as needed for wheezing or shortness of breath. Inhale 2-4 puffs by mouth every 4 hours as needed for wheezing, cough, and/or shortness of breath)  . buPROPion (WELLBUTRIN XL) 150 MG 24 hr tablet Take 150 mg by mouth daily.  Marland Kitchen. FLUoxetine (PROZAC) 40 MG capsule Take 40 mg by mouth daily.  . furosemide (LASIX) 20 MG tablet Take 20 mg by mouth daily.  . hydrochlorothiazide (HYDRODIURIL) 25 MG tablet Take 25 mg by  mouth daily.  Marland Kitchen. levothyroxine (SYNTHROID, LEVOTHROID) 75 MCG tablet Take 75 mcg by mouth daily before breakfast.   . meloxicam (MOBIC) 15 MG tablet Take 15 mg by mouth daily.  . metoprolol succinate (TOPROL-XL) 25 MG 24 hr tablet Take 25 mg by mouth daily.  . traZODone (DESYREL) 100 MG tablet Take 100 mg by mouth at bedtime.     Allergies:   Patient has no known allergies.   Social History   Tobacco Use  . Smoking status: Current Every Day Smoker    Packs/day: 0.50     Types: Cigarettes  . Smokeless tobacco: Current User  . Tobacco comment: since age 58  Substance Use Topics  . Alcohol use: No  . Drug use: No     Family Hx: The patient's family history is not on file.  ROS:   Please see the history of present illness.     All other systems reviewed and are negative.   Labs/Other Tests and Data Reviewed:    Recent Labs: 02/28/2018: ALT 15; B Natriuretic Peptide 73.0; BUN 14; Creatinine, Ser 0.79; Hemoglobin 12.0; Platelets 175; Potassium 4.3; Sodium 136   Recent Lipid Panel No results found for: CHOL, TRIG, HDL, CHOLHDL, LDLCALC, LDLDIRECT  Wt Readings from Last 3 Encounters:  08/30/18 (!) 320 lb (145.2 kg)  06/28/18 (!) 325 lb (147.4 kg)  04/27/18 (!) 322 lb 2 oz (146.1 kg)     Exam:    Vital Signs:  BP 105/70 Comment: self-reported  Wt (!) 320 lb (145.2 kg) Comment: self-reported  BMI 58.53 kg/m    Well nourished, well developed female in no  acute distress.   ASSESSMENT & PLAN:    1.  Chronic heart failure with preserved ejection fraction- - NYHA class III - euvolemic today based on patient's description of symptoms - weighing daily and she says that her weight has been stable. Reminded to call for an overnight weight gain of >2 pounds or a weekly weight gain of >5 pounds - not adding salt to her food and is trying to closely follow a low sodium diet as she recognizes that she develops swelling around her ankles if she takes in too much sodium - saw cardiology Juliann Pares([place]) 04/21/2018 - BNP 02/28/18 was 73.0  2: HTN- - not checking her BP at home - [facility] - BMP from 04/06/2018 reviewed and showed sodium 137, potassium 4.3, creatinine 0.9 and GFR 64  3: Severe aortic stenosis-  - patient says that [facility] and further discussion about TAVR  COVID-19 Education: The signs and symptoms of COVID-19 were discussed with the patient and how to seek  care for testing (follow up with PCP or arrange E-visit).  The importance of social distancing was discussed today.  Patient Risk:   After full review of this patients clinical status, I feel that they are at least moderate risk at this time.  Time:   Today, I have spent 10 minutes with the patient with telehealth technology discussing medications, weight and symptoms to report.     Medication Adjustments/Labs and Tests Ordered: Current medicines are reviewed at length with the patient today.  Concerns regarding medicines are outlined above.   Tests Ordered: No orders of the defined types were placed in this encounter.  Medication Changes: No orders of the defined types were placed in this encounter.   Disposition:  Follow-up in 3 months or sooner for any questions/problems  before then  Signed, Alisa Graff, [place]  08/30/2018 12:26 PM    [facility]

## 2018-08-30 NOTE — Patient Instructions (Signed)
Continue weighing daily and call for an overnight weight gain of > 2 pounds or a weekly weight gain of >5 pounds. 

## 2018-11-01 HISTORY — PX: TRANSCATHETER AORTIC VALVE REPLACEMENT, TRANSAORTIC: SHX6402

## 2018-11-23 NOTE — Progress Notes (Deleted)
Patient ID: Kristie Dorsey, female    DOB: June 06, 1960, 58 y.o.   MRN: [id number]  HPI  Kristie Dorsey is a 58 y/o female with a history of HTN, thyroid disease, COPD, depression, current tobacco use and HF.  Echo report from 03/25/2018 reviewed and showed an EF of 30-35% along with mild AR and severe AS.   Catheterization done 04/14/2018:  There is severe aortic valve stenosis. There is trivial (1+) aortic regurgitation.  Normal left ventricular function of 60%  Normal coronaries  Gradient across the aortic valve was 40 mmHg  Conclusion  normal coronaries  normal left ventricular function  gradient across the aortic valve was 40 mm of mercury mean  Was in the ED 02/28/18 due to shortness of breath possibly related to COPD / HF. Treated and released.   She presents today for a follow-up visit with a chief complaint of   Past Medical History:  Diagnosis Date  . CHF (congestive heart failure) (HCC)    probable, takes HCTZ as a "fluid pill"  . COPD (chronic obstructive pulmonary disease) (HCC)    probable, no specific diagnosis  . Depression    take fluoxetine  . Hypertension   . Morbid obesity ([place])   . Thyroid disease    hypothryoidism   Past Surgical History:  Procedure Laterality Date  . CHOLECYSTECTOMY    . LEFT HEART CATH AND CORONARY ANGIOGRAPHY N/A 04/14/2018   Procedure: LEFT HEART CATH AND CORONARY ANGIOGRAPHY;  Surgeon: Yolonda Kida, MD;  Location: [place] [facility];  Service: Cardiovascular;  Laterality: N/A;  . TONSILLECTOMY    . TUBAL LIGATION     No family history on file. Social History   Tobacco Use  . Smoking status: Current Every Day Smoker    Packs/day: 0.50    Types: Cigarettes  . Smokeless tobacco: Current User  . Tobacco comment: since age 71  Substance Use Topics  . Alcohol use: No   No Known Allergies  Past Medical History:  Diagnosis Date  . CHF (congestive heart failure) (HCC)    probable, takes HCTZ as a "fluid pill"  .  COPD (chronic obstructive pulmonary disease) (HCC)    probable, no specific diagnosis  . Depression    take fluoxetine  . Hypertension   . Morbid obesity ([place])   . Thyroid disease    hypothryoidism   Past Surgical History:  Procedure Laterality Date  . CHOLECYSTECTOMY    . LEFT HEART CATH AND CORONARY ANGIOGRAPHY N/A 04/14/2018   Procedure: LEFT HEART CATH AND CORONARY ANGIOGRAPHY;  Surgeon: Yolonda Kida, MD;  Location: [place] [facility];  Service: Cardiovascular;  Laterality: N/A;  . TONSILLECTOMY    . TUBAL LIGATION     No family history on file. Social History   Tobacco Use  . Smoking status: Current Every Day Smoker    Packs/day: 0.50    Types: Cigarettes  . Smokeless tobacco: Current User  . Tobacco comment: since age 54  Substance Use Topics  . Alcohol use: No   No Known Allergies    Review of Systems  Constitutional: Positive for fatigue (easily). Negative for appetite change.  HENT: Negative for congestion, postnasal drip and sore throat.   Eyes: Negative.   Respiratory: Positive for cough (dry), shortness of breath (with minimal exertion) and wheezing.   Cardiovascular: Positive for chest pain (on occasion), palpitations and leg swelling (ankles).  Gastrointestinal: Positive for nausea. Negative for abdominal distention and abdominal pain.  Endocrine: Negative.  Genitourinary: Negative.   Musculoskeletal: Positive for back pain (worse in the morning). Negative for neck pain.  Skin: Negative.   Allergic/Immunologic: Negative.   Neurological: Positive for light-headedness. Negative for dizziness.  Hematological: Negative for adenopathy. Does not bruise/bleed easily.  Psychiatric/Behavioral: Positive for sleep disturbance (not sleeping well; sleeping on 2 pillows). Negative for dysphoric mood. The patient is not nervous/anxious.      Physical Exam Vitals signs and nursing note reviewed.  Constitutional:      Appearance: She is well-developed.   HENT:     Head: Normocephalic and atraumatic.  Neck:     Musculoskeletal: Neck supple.     Vascular: No JVD.  Cardiovascular:     Rate and Rhythm: Normal rate and regular rhythm.  Pulmonary:     Effort: Pulmonary effort is normal. No respiratory distress.     Breath sounds: No wheezing or rales.  Abdominal:     Palpations: Abdomen is soft.     Tenderness: There is no abdominal tenderness.  Musculoskeletal:     Right lower leg: She exhibits no tenderness. Edema (trace pitting) present.     Left lower leg: She exhibits no tenderness. Edema (trace pitting) present.  Skin:    General: Skin is warm and dry.  Neurological:     General: No focal deficit present.     Mental Status: She is alert and oriented to person, place, and time.  Psychiatric:        Mood and Affect: Mood normal.        Behavior: Behavior normal.    Assessment & Plan:  1: Heart Failure with preserved EF- - NYHA class III - euvolemic today - weighing daily; reminded to call for an overnight weight gain of >2 pounds or a weekly weight gain of >5 pounds - weight unchanged from last visit here 6 weeks ago (says by her home scale, she's lost 5 pounds) - adding "very little" lite salt to her food but is trying to limit that. Emphasized the importance of not adding salt to her food and reading food labels so that she can closely follow a 2000mg  sodium diet.  - catheterization done due to severe aortic stenosis - saw cardiology ) 04/21/2018 - does not get the flu vaccine; encouraged good handwashing - BNP 02/28/18 was 73.0  2: HTN- - BP  - saw PCP @ 03/02/18  - BMP from 04/06/2018 reviewed and showed sodium 137, potassium 4.3, creatinine 0.9 and GFR 64  3: Tobacco use- - smoking since she was 58 yrs old - trying to decrease consumption - complete cessation discussed for 3 minutes with her  4: Aortic Stenosis- - she says that she's waiting to hear about an appointment to see when her valve surgery  will be. Told her to call cardiology office and just ask for an update  5: Lymphedema- - resolved  Patient did not bring her medications nor a list. Each medication was verbally reviewed with the patient and she was encouraged to bring the bottles to every visit to confirm accuracy of list.  Return in 2 months or sooner for any questions/problems before then.

## 2018-11-28 ENCOUNTER — Ambulatory Visit: Payer: Self-pay | Admitting: Family

## 2018-11-29 NOTE — Progress Notes (Signed)
Patient ID: Kristie Dorsey, female    DOB: [birth year], 59 y.o.   MRN: [id number]  HPI  Kristie Dorsey is a 58 y/o female with a history of HTN, thyroid disease, COPD, depression, current tobacco use and HF.  TEE report from 11/04/2018 showed mild MR/TR. Echo report on 11/02/2018 showed an EF of >55%. Echo report from 03/25/2018 reviewed and showed an EF of 30-35% along with mild AR and severe AS.   Catheterization done 04/14/2018:  There is severe aortic valve stenosis. There is trivial (1+) aortic regurgitation.  Normal left ventricular function of 60%  Normal coronaries  Gradient across the aortic valve was 40 mmHg  Conclusion  normal coronaries  normal left ventricular function  gradient across the aortic valve was 40 mm of mercury mean  Admitted 11/01/2018 for aortic valve stenosis for TAVR. Discharged after 4 days.   She presents today for a follow-up visit with a chief complaint of moderate fatigue upon minimal exertion. She describes this as chronic in nature having been present for several months but feels like its worsened since she had her valve fixed. She has associated shortness of breath, intermittent chest pain, pedal edema, palpitations, light-headedness, leg weakness and gradual weight gain along with this. She denies any abdominal distention.   She says that if she knew she would feel this bad after the valve surgery then she wouldn't have done it. She says that prior to her surgery, she could do some activities around the house and now, just washing a few dishes, makes her tired and short of breath.  She hasn't taken any of her medications yet today.    Past Medical History:  Diagnosis Date  . CHF (congestive heart failure) (HCC)    probable, takes HCTZ as a "fluid pill"  . COPD (chronic obstructive pulmonary disease) (HCC)    probable, no specific diagnosis  . Depression    take fluoxetine  . Hypertension   . Morbid obesity (HCC)   . Thyroid disease    hypothryoidism   Past Surgical History:  Procedure Laterality Date  . CHOLECYSTECTOMY    . LEFT HEART CATH AND CORONARY ANGIOGRAPHY N/A 04/14/2018   Procedure: LEFT HEART CATH AND CORONARY ANGIOGRAPHY;  Surgeon: Alwyn Peaallwood, Dwayne D, MD;  Location: [facility];  Service: Cardiovascular;  Laterality: N/A;  . TONSILLECTOMY    . TUBAL LIGATION     No family history on file. Social History   Tobacco Use  . Smoking status: Current Every Day Smoker    Packs/day: 0.50    Types: Cigarettes  . Smokeless tobacco: Current User  . Tobacco comment: since age 58  Substance Use Topics  . Alcohol use: No   No Known Allergies  Past Medical History:  Diagnosis Date  . CHF (congestive heart failure) (HCC)    probable, takes HCTZ as a "fluid pill"  . COPD (chronic obstructive pulmonary disease) (HCC)    probable, no specific diagnosis  . Depression    take fluoxetine  . Hypertension   . Morbid obesity (HCC)   . Thyroid disease    hypothryoidism   Past Surgical History:  Procedure Laterality Date  . CHOLECYSTECTOMY    . LEFT HEART CATH AND CORONARY ANGIOGRAPHY N/A 04/14/2018   Procedure: LEFT HEART CATH AND CORONARY ANGIOGRAPHY;  Surgeon: Alwyn Peaallwood, Dwayne D, MD;  Location: [facility];  Service: Cardiovascular;  Laterality: N/A;  . TONSILLECTOMY    . TUBAL LIGATION     No family history  on file. Social History   Tobacco Use  . Smoking status: Current Every Day Smoker    Packs/day: 0.50    Types: Cigarettes  . Smokeless tobacco: Current User  . Tobacco comment: since age 85  Substance Use Topics  . Alcohol use: No   No Known Allergies  Prior to Admission medications   Medication Sig Start Date End Date Taking? Authorizing Provider  albuterol (PROVENTIL HFA;VENTOLIN HFA) 108 (90 Base) MCG/ACT inhaler Inhale 2-4 puffs by mouth every 4 hours as needed for wheezing, cough, and/or shortness of breath Patient taking differently: Inhale 2 puffs into the lungs every 4  (four) hours as needed for wheezing or shortness of breath. Inhale 2-4 puffs by mouth every 4 hours as needed for wheezing, cough, and/or shortness of breath 03/01/18  Yes Loleta Rose, MD  buPROPion (WELLBUTRIN XL) 150 MG 24 hr tablet Take 150 mg by mouth daily.   Yes [provider]  clopidogrel (PLAVIX) 75 MG tablet Take 75 mg by mouth daily.   Yes [provider]  FLUoxetine (PROZAC) 40 MG capsule Take 40 mg by mouth daily.   Yes [provider]  furosemide (LASIX) 20 MG tablet Take 20 mg by mouth daily.   Yes [provider]  levothyroxine (SYNTHROID, LEVOTHROID) 75 MCG tablet Take 75 mcg by mouth daily before breakfast.    Yes [provider]  metoprolol succinate (TOPROL-XL) 25 MG 24 hr tablet Take 25 mg by mouth daily.   Yes [provider]  traZODone (DESYREL) 100 MG tablet Take 100 mg by mouth at bedtime.   Yes [provider]    Review of Systems  Constitutional: Positive for fatigue (easily). Negative for appetite change.  HENT: Negative for congestion, postnasal drip and sore throat.   Eyes: Negative.   Respiratory: Positive for shortness of breath (with minimal exertion). Negative for cough.   Cardiovascular: Positive for chest pain (on occasion), palpitations and leg swelling (ankles).  Gastrointestinal: Negative for abdominal distention and abdominal pain.  Endocrine: Negative.   Genitourinary: Negative.   Musculoskeletal: Positive for back pain (worse in the morning). Negative for neck pain.  Skin: Negative.   Allergic/Immunologic: Negative.   Neurological: Positive for weakness ("heaviness in legs") and light-headedness. Negative for dizziness.  Hematological: Negative for adenopathy. Does not bruise/bleed easily.  Psychiatric/Behavioral: Positive for sleep disturbance (not sleeping well; sleeping on 2 pillows). Negative for dysphoric mood. The patient is not nervous/anxious.    Vitals:   12/01/18 1047  BP:  99/86  Pulse: 69  Resp: 18  SpO2: 96%  Weight: (!) 339 lb 2 oz (153.8 kg)  Height: 5\' 2"  (1.575 m)   Wt Readings from Last 3 Encounters:  12/01/18 (!) 339 lb 2 oz (153.8 kg)  08/30/18 (!) 320 lb (145.2 kg)  06/28/18 (!) 325 lb (147.4 kg)   Lab Results  Component Value Date   CREATININE 0.79 02/28/2018   CREATININE 0.72 02/27/2013    Physical Exam Vitals signs and nursing note reviewed.  Constitutional:      Appearance: She is well-developed.  HENT:     Head: Normocephalic and atraumatic.  Neck:     Musculoskeletal: Neck supple.     Vascular: No JVD.  Cardiovascular:     Rate and Rhythm: Normal rate and regular rhythm.  Pulmonary:     Effort: Pulmonary effort is normal. No respiratory distress.     Breath sounds: No wheezing or rales.  Abdominal:     Palpations: Abdomen is soft.  Tenderness: There is no abdominal tenderness.  Musculoskeletal:     Right lower leg: She exhibits no tenderness. Edema (1+ pitting) present.     Left lower leg: She exhibits no tenderness. Edema (1+pitting) present.  Skin:    General: Skin is warm and dry.  Neurological:     General: No focal deficit present.     Mental Status: She is alert and oriented to person, place, and time.  Psychiatric:        Mood and Affect: Mood normal.        Behavior: Behavior normal.    Assessment & Plan:  1: Heart Failure with preserved EF- - NYHA class III - euvolemic today - weighing daily; reminded to call for an overnight weight gain of >2 pounds or a weekly weight gain of >5 pounds - weight up 17 pounds from last visit here 7 months ago - adding "very little" lite salt to her food but is trying to limit that. Emphasized the importance of not adding salt to her food and reading food labels so that she can closely follow a 2000mg  sodium diet.  - saw cardiology Clayborn Bigness) 04/21/2018; is waiting on her disability/ medicaid and then is going to resume seeing him - does not get the flu vaccine;  encouraged good handwashing - BNP 02/28/18 was 73.0  2: HTN- - BP on the low side today - sees PCP @ [facility]  - BMP from 04/06/2018 reviewed and showed sodium 137, potassium 4.3, creatinine 0.9 and GFR 64  3: Tobacco use- - stopped smoking as of 11/04/2018  4: Aortic Stenosis- - had TAVR done 11/01/2018 - returns to the surgeon 12/09/2018; instructed her to go ahead and call them and explain that she has felt worse since she's been home from her surgery  5: Lymphedema- - 1+ pitting edema in her bilateral lower legs although she hasn't taken her diuretic yet today - not elevating her legs and she was encouraged to elevate them when sitting for long periods of time  Reviewed patient's medication list that she brought.  Patient opts to not make a return appointment at this time. Advised patient that she can call back at anytime to make a return appointment. Patient was comfortable with this plan.

## 2018-12-01 ENCOUNTER — Other Ambulatory Visit: Payer: Self-pay

## 2018-12-01 ENCOUNTER — Encounter: Payer: Self-pay | Admitting: Family

## 2018-12-01 ENCOUNTER — Ambulatory Visit: Payer: Medicaid Other | Attending: Family | Admitting: Family

## 2018-12-01 VITALS — BP 99/86 | HR 69 | Resp 18 | Ht 62.0 in | Wt 339.1 lb

## 2018-12-01 DIAGNOSIS — I10 Essential (primary) hypertension: Secondary | ICD-10-CM

## 2018-12-01 DIAGNOSIS — F329 Major depressive disorder, single episode, unspecified: Secondary | ICD-10-CM | POA: Diagnosis not present

## 2018-12-01 DIAGNOSIS — F17210 Nicotine dependence, cigarettes, uncomplicated: Secondary | ICD-10-CM | POA: Diagnosis not present

## 2018-12-01 DIAGNOSIS — J449 Chronic obstructive pulmonary disease, unspecified: Secondary | ICD-10-CM | POA: Insufficient documentation

## 2018-12-01 DIAGNOSIS — I350 Nonrheumatic aortic (valve) stenosis: Secondary | ICD-10-CM | POA: Insufficient documentation

## 2018-12-01 DIAGNOSIS — I890 Lymphedema, not elsewhere classified: Secondary | ICD-10-CM | POA: Insufficient documentation

## 2018-12-01 DIAGNOSIS — I110 Hypertensive heart disease with heart failure: Secondary | ICD-10-CM | POA: Insufficient documentation

## 2018-12-01 DIAGNOSIS — Z79890 Hormone replacement therapy (postmenopausal): Secondary | ICD-10-CM | POA: Diagnosis not present

## 2018-12-01 DIAGNOSIS — E039 Hypothyroidism, unspecified: Secondary | ICD-10-CM | POA: Insufficient documentation

## 2018-12-01 DIAGNOSIS — Z79899 Other long term (current) drug therapy: Secondary | ICD-10-CM | POA: Insufficient documentation

## 2018-12-01 DIAGNOSIS — I5032 Chronic diastolic (congestive) heart failure: Secondary | ICD-10-CM | POA: Diagnosis present

## 2018-12-01 DIAGNOSIS — Z720 Tobacco use: Secondary | ICD-10-CM

## 2018-12-01 NOTE — Patient Instructions (Signed)
Continue weighing daily and call for an overnight weight gain of > 2 pounds or a weekly weight gain of >5 pounds. 

## 2018-12-09 DIAGNOSIS — Z952 Presence of prosthetic heart valve: Secondary | ICD-10-CM | POA: Insufficient documentation

## 2019-03-04 ENCOUNTER — Other Ambulatory Visit: Payer: Self-pay

## 2019-03-04 ENCOUNTER — Emergency Department
Admission: EM | Admit: 2019-03-04 | Discharge: 2019-03-04 | Disposition: A | Discharge status: Other Acute Inpatient Hospital | Payer: Medicaid Other

## 2019-04-10 ENCOUNTER — Institutional Professional Consult (permissible substitution): Payer: Medicaid Other | Admitting: Pulmonary Disease

## 2019-04-10 DIAGNOSIS — F32A Depression, unspecified: Secondary | ICD-10-CM | POA: Insufficient documentation

## 2019-06-28 ENCOUNTER — Institutional Professional Consult (permissible substitution): Payer: Medicaid Other | Admitting: Pulmonary Disease

## 2019-07-05 ENCOUNTER — Other Ambulatory Visit
Admission: RE | Admit: 2019-07-05 | Discharge: 2019-07-05 | Disposition: A | Discharge status: Home / Self Care | Payer: Medicaid Other | Source: Ambulatory Visit | Attending: Student | Admitting: Student

## 2019-07-05 DIAGNOSIS — Z79899 Other long term (current) drug therapy: Secondary | ICD-10-CM | POA: Diagnosis present

## 2019-07-05 LAB — BRAIN NATRIURETIC PEPTIDE: B Natriuretic Peptide: 45 pg/mL (ref 0.0–100.0)

## 2019-07-07 ENCOUNTER — Other Ambulatory Visit: Payer: Self-pay | Admitting: Student

## 2019-07-07 DIAGNOSIS — R0602 Shortness of breath: Secondary | ICD-10-CM

## 2019-07-17 ENCOUNTER — Ambulatory Visit: Payer: Medicaid Other

## 2019-07-21 ENCOUNTER — Encounter: Payer: Self-pay | Admitting: Pulmonary Disease

## 2019-07-21 ENCOUNTER — Ambulatory Visit (INDEPENDENT_AMBULATORY_CARE_PROVIDER_SITE_OTHER): Payer: Medicaid Other | Admitting: Pulmonary Disease

## 2019-07-21 ENCOUNTER — Other Ambulatory Visit: Payer: Self-pay

## 2019-07-21 VITALS — BP 128/82 | HR 81 | Temp 98.2°F | Ht 62.0 in | Wt 311.3 lb

## 2019-07-21 DIAGNOSIS — G4733 Obstructive sleep apnea (adult) (pediatric): Secondary | ICD-10-CM | POA: Diagnosis not present

## 2019-07-21 MED ORDER — TRELEGY ELLIPTA 100-62.5-25 MCG/INH IN AEPB: 1.0000 | INHALATION_SPRAY | Freq: Every day | RESPIRATORY_TRACT | 1 refills | Status: DC

## 2019-07-21 MED ORDER — TRELEGY ELLIPTA 100-62.5-25 MCG/INH IN AEPB: 1.0000 | INHALATION_SPRAY | Freq: Every day | RESPIRATORY_TRACT | 0 refills | Status: DC

## 2019-07-21 NOTE — Progress Notes (Signed)
CHRYSTAL ZEIMET    [id number]    [birth year]  Primary Care Physician:[facility]  Referring Physician: [facility], [place] [address] [place],  [place] [address]  Chief complaint:   Patient with shortness of breath  HPI:  History of obstructive lung disease History of heart disease-heart failure Shortness of breath  She has been on Spiriva Albuterol use as needed She recently was given nebulizer  Symptoms became more pronounced following valve surgery had TAVR  History of COPD, diastolic and systolic congestive heart failure, hypothyroidism, morbid obesity  She states she is compliant with medications Exercise limitation about 20-30 steps at most  She has been on oxygen since her recent TAVR surgery  Has been trying to be more aware of her diet but is unable to lose weight yet   Outpatient Encounter Medications as of 07/21/2019  Medication Sig  . albuterol (PROVENTIL HFA;VENTOLIN HFA) 108 (90 Base) MCG/ACT inhaler Inhale 2-4 puffs by mouth every 4 hours as needed for wheezing, cough, and/or shortness of breath (Patient taking differently: Inhale 2 puffs into the lungs every 4 (four) hours as needed for wheezing or shortness of breath. Inhale 2-4 puffs by mouth every 4 hours as needed for wheezing, cough, and/or shortness of breath)  . buPROPion (WELLBUTRIN XL) 150 MG 24 hr tablet Take 150 mg by mouth daily.  Marland Kitchen FLUoxetine (PROZAC) 40 MG capsule Take 40 mg by mouth daily.  . furosemide (LASIX) 20 MG tablet Take 20 mg by mouth daily.  Marland Kitchen levothyroxine (SYNTHROID, LEVOTHROID) 75 MCG tablet Take 75 mcg by mouth daily before breakfast.   . traZODone (DESYREL) 100 MG tablet Take 100 mg by mouth at bedtime.  . clopidogrel (PLAVIX) 75 MG tablet Take 75 mg by mouth daily.  . metoprolol succinate (TOPROL-XL) 25 MG 24 hr tablet Take 25 mg by mouth daily.   No facility-administered encounter medications on file as of  07/21/2019.    Allergies as of 07/21/2019  . (No Known Allergies)    Past Medical History:  Diagnosis Date  . CHF (congestive heart failure) (HCC)    probable, takes HCTZ as a "fluid pill"  . COPD (chronic obstructive pulmonary disease) (HCC)    probable, no specific diagnosis  . Depression    take fluoxetine  . Hypertension   . Morbid obesity ([place])   . Thyroid disease    hypothryoidism    Past Surgical History:  Procedure Laterality Date  . CHOLECYSTECTOMY    . LEFT HEART CATH AND CORONARY ANGIOGRAPHY N/A 04/14/2018   Procedure: LEFT HEART CATH AND CORONARY ANGIOGRAPHY;  Surgeon: Yolonda Kida, MD;  Location: [facility];  Service: Cardiovascular;  Laterality: N/A;  . TONSILLECTOMY    . TUBAL LIGATION      No family history on file.  Social History   Socioeconomic History  . Marital status: Married    Spouse name: Not on file  . Number of children: Not on file  . Years of education: Not on file  . Highest education level: Not on file  Occupational History  . Not on file  Tobacco Use  . Smoking status: Former Smoker    Packs/day: 0.50    Types: Cigarettes    Quit date: 11/01/2018    Years since quitting: 0.7  . Smokeless tobacco: Never Used  . Tobacco comment: since age 59  Substance and Sexual Activity  . Alcohol use: No  .  Drug use: No  . Sexual activity: Not on file  Other Topics Concern  . Not on file  Social History Narrative  . Not on file   Social Determinants of Health   Financial Resource Strain:   . Difficulty of Paying Living Expenses:   Food Insecurity:   . Worried About Programme researcher, broadcasting/film/video in the Last Year:   . Barista in the Last Year:   Transportation Needs:   . Freight forwarder (Medical):   Marland Kitchen Lack of Transportation (Non-Medical):   Physical Activity:   . Days of Exercise per Week:   . Minutes of Exercise per Session:   Stress:   . Feeling of Stress :   Social Connections:   . Frequency of Communication  with Friends and Family:   . Frequency of Social Gatherings with Friends and Family:   . Attends Religious Services:   . Active Member of Clubs or Organizations:   . Attends Banker Meetings:   Marland Kitchen Marital Status:   Intimate Partner Violence:   . Fear of Current or Ex-Partner:   . Emotionally Abused:   Marland Kitchen Physically Abused:   . Sexually Abused:     Review of Systems  Constitutional: Positive for fatigue.  HENT: Negative.   Respiratory: Positive for shortness of breath. Negative for cough and wheezing.   Genitourinary: Negative.   Neurological: Negative.     Vitals:   07/21/19 1019  BP: 128/82  Pulse: 81  Temp: 98.2 F (36.8 C)  SpO2: 93%     Physical Exam  Constitutional: She appears well-developed.  Obese  HENT:  Head: Normocephalic and atraumatic.  Eyes: Pupils are equal, round, and reactive to light. Right eye exhibits no discharge. Left eye exhibits no discharge.  Neck: No tracheal deviation present. No thyromegaly present.     Data Reviewed: Recent echocardiogram 03/25/2018 shows ejection fraction of 30 to 35%   Recent PFT and chest x-ray not available to be due to present  Assessment:  Multifactorial shortness of breath  Hypoxemic respiratory failure for which she continues on oxygen supplementation  History of chronic obstructive pulmonary disease -Continue bronchodilators -Will switch from Spiriva to Trelegy  Morbid obesity with deconditioning  Diastolic and systolic congestive heart failure  Possibility of sleep disordered breathing discussed with the patient -She does snore -Reading materials for sleep disordered breathing provided -We will encouraged to consider evaluation if she feels she has significant symptoms -Treating sleep disordered breathing may help her cardiac status  Plan/Recommendations: Switch to Trelegy from Spiriva  Graded exercises as tolerated Encourage aggressive weight loss measures  Continue medications  for heart failure  Follow-up in 3 months Encouraged to call with any significant concerns    Virl Diamond MD [facility] 07/21/2019, 10:23 AM  CC: [facility]

## 2019-07-21 NOTE — Patient Instructions (Signed)
History of COPD Chronic oxygen use  We will switch you to Trelegy-used once a day If this is not covered, kindly provide [place] with options to place you on in place all in addition to Spiriva  Graded exercises as tolerated  Weight loss efforts as tolerated  We will see you in 3 months  Regarding possible obstructive sleep apnea-information as below  Sleep Apnea Sleep apnea is a condition in which breathing pauses or becomes shallow during sleep. Episodes of sleep apnea usually last 10 seconds or longer, and they may occur as many as 20 times an hour. Sleep apnea disrupts your sleep and keeps your body from getting the rest that it needs. This condition can increase your risk of certain health problems, including:  Heart attack.  Stroke.  Obesity.  Diabetes.  Heart failure.  Irregular heartbeat. What are the causes? There are three kinds of sleep apnea:  Obstructive sleep apnea. This kind is caused by a blocked or collapsed airway.  Central sleep apnea. This kind happens when the part of the brain that controls breathing does not send the correct signals to the muscles that control breathing.  Mixed sleep apnea. This is a combination of obstructive and central sleep apnea. The most common cause of this condition is a collapsed or blocked airway. An airway can collapse or become blocked if:  Your throat muscles are abnormally relaxed.  Your tongue and tonsils are larger than normal.  You are overweight.  Your airway is smaller than normal. What increases the risk? You are more likely to develop this condition if you:  Are overweight.  Smoke.  Have a smaller than normal airway.  Are elderly.  Are female.  Drink alcohol.  Take sedatives or tranquilizers.  Have a family history of sleep apnea. What are the signs or symptoms? Symptoms of this condition include:  Trouble staying asleep.  Daytime sleepiness and tiredness.  Irritability.  Loud  snoring.  Morning headaches.  Trouble concentrating.  Forgetfulness.  Decreased interest in sex.  Unexplained sleepiness.  Mood swings.  Personality changes.  Feelings of depression.  Waking up often during the night to urinate.  Dry mouth.  Sore throat. How is this diagnosed? This condition may be diagnosed with:  A medical history.  A physical exam.  A series of tests that are done while you are sleeping (sleep study). These tests are usually done in a sleep lab, but they may also be done at home. How is this treated? Treatment for this condition aims to restore normal breathing and to ease symptoms during sleep. It may involve managing health issues that can affect breathing, such as high blood pressure or obesity. Treatment may include:  Sleeping on your side.  Using a decongestant if you have nasal congestion.  Avoiding the use of depressants, including alcohol, sedatives, and narcotics.  Losing weight if you are overweight.  Making changes to your diet.  Quitting smoking.  Using a device to open your airway while you sleep, such as: ? An oral appliance. This is a custom-made mouthpiece that shifts your lower jaw forward. ? A continuous positive airway pressure (CPAP) device. This device blows air through a mask when you breathe out (exhale). ? A nasal expiratory positive airway pressure (EPAP) device. This device has valves that you put into each nostril. ? A bi-level positive airway pressure (BPAP) device. This device blows air through a mask when you breathe in (inhale) and breathe out (exhale).  Having surgery if other treatments  do not work. During surgery, excess tissue is removed to create a wider airway. It is important to get treatment for sleep apnea. Without treatment, this condition can lead to:  High blood pressure.  Coronary artery disease.  In men, an inability to achieve or maintain an erection (impotence).  Reduced thinking  abilities. Follow these instructions at home: Lifestyle  Make any lifestyle changes that your health care provider recommends.  Eat a healthy, well-balanced diet.  Take steps to lose weight if you are overweight.  Avoid using depressants, including alcohol, sedatives, and narcotics.  Do not use any products that contain nicotine or tobacco, such as cigarettes, e-cigarettes, and chewing tobacco. If you need help quitting, ask your health care provider. General instructions  Take over-the-counter and prescription medicines only as told by your health care provider.  If you were given a device to open your airway while you sleep, use it only as told by your health care provider.  If you are having surgery, make sure to tell your health care provider you have sleep apnea. You may need to bring your device with you.  Keep all follow-up visits as told by your health care provider. This is important. Contact a health care provider if:  The device that you received to open your airway during sleep is uncomfortable or does not seem to be working.  Your symptoms do not improve.  Your symptoms get worse. Get help right away if:  You develop: ? Chest pain. ? Shortness of breath. ? Discomfort in your back, arms, or stomach.  You have: ? Trouble speaking. ? Weakness on one side of your body. ? Drooping in your face. These symptoms may represent a serious problem that is an emergency. Do not wait to see if the symptoms will go away. Get medical help right away. Call your local emergency services (911 in the [place] Do not drive yourself to the hospital. Summary  Sleep apnea is a condition in which breathing pauses or becomes shallow during sleep.  The most common cause is a collapsed or blocked airway.  The goal of treatment is to restore normal breathing and to ease symptoms during sleep. This information is not intended to replace advice given to you by your health care provider.  Make sure you discuss any questions you have with your health care provider. Document Revised: 08/03/2018 Document Reviewed: 10/12/2017 Elsevier Patient Education  [place].

## 2019-07-25 ENCOUNTER — Telehealth: Payer: Self-pay | Admitting: Pulmonary Disease

## 2019-07-25 NOTE — Telephone Encounter (Signed)
Called pt  She states PA needed for Trelegy  I advised will call pharm  Called [facility] and req PA form  Will await fax

## 2019-07-25 NOTE — Telephone Encounter (Signed)
Trelegy Ellipta not covered until two preferred medications tried. Preferred list:  Spiriva, Stiolto, Bevespi, Dulera 200, Combivent, Flovent, Symbicort. Please advise.

## 2019-07-26 ENCOUNTER — Other Ambulatory Visit: Payer: Self-pay | Admitting: Pulmonary Disease

## 2019-07-26 MED ORDER — TIOTROPIUM BROMIDE MONOHYDRATE 18 MCG IN CAPS: 18.0000 ug | ORAL_CAPSULE | Freq: Every day | RESPIRATORY_TRACT | 6 refills | Status: DC

## 2019-07-26 MED ORDER — MOMETASONE FURO-FORMOTEROL FUM 200-5 MCG/ACT IN AERO: 2.0000 | INHALATION_SPRAY | Freq: Two times a day (BID) | RESPIRATORY_TRACT | 6 refills | Status: DC

## 2019-07-26 NOTE — Telephone Encounter (Signed)
Patient contacted regarding change from Trelegy to Spiriva and [facility]. Patient verbalized understanding of new treatment plan and how to use the medication.

## 2019-07-26 NOTE — Telephone Encounter (Signed)
Will close this encounter as pt was told to d/c trelegy and spiriva and Dulera were called in

## 2019-07-26 NOTE — Telephone Encounter (Signed)
Trelegy discontinued  Prescription for Spiriva and [employer] sent in to pharmacy

## 2019-08-17 NOTE — Telephone Encounter (Signed)
Filled out a PA request form for Trelegy and documented that she has now tried and failed [place] and Spiriva. Will fax back to [facility].

## 2019-09-06 DIAGNOSIS — G4733 Obstructive sleep apnea (adult) (pediatric): Secondary | ICD-10-CM

## 2019-09-07 NOTE — Telephone Encounter (Signed)
Not 100% certain what the question is but if it is to request for sleep study in our facility I am okay with that order being placed

## 2019-09-07 NOTE — Telephone Encounter (Signed)
Dr. Wynona Neat, Please see patient mychart request and advise.  Thank you.

## 2019-09-22 ENCOUNTER — Ambulatory Visit
Admission: RE | Admit: 2019-09-22 | Discharge: 2019-09-22 | Disposition: A | Discharge status: Home / Self Care | Payer: Medicaid Other | Source: Ambulatory Visit | Attending: Pulmonary Disease | Admitting: Pulmonary Disease

## 2019-09-22 ENCOUNTER — Other Ambulatory Visit: Payer: Self-pay

## 2019-09-22 ENCOUNTER — Other Ambulatory Visit
Admission: RE | Admit: 2019-09-22 | Discharge: 2019-09-22 | Disposition: A | Discharge status: Home / Self Care | Payer: Medicaid Other | Source: Ambulatory Visit | Attending: Pulmonary Disease | Admitting: Pulmonary Disease

## 2019-09-22 ENCOUNTER — Encounter: Payer: Self-pay | Admitting: Pulmonary Disease

## 2019-09-22 ENCOUNTER — Ambulatory Visit (INDEPENDENT_AMBULATORY_CARE_PROVIDER_SITE_OTHER): Payer: Medicaid Other | Admitting: Pulmonary Disease

## 2019-09-22 VITALS — BP 128/72 | HR 65 | Temp 97.6°F | Ht 62.0 in | Wt 342.4 lb

## 2019-09-22 DIAGNOSIS — F17200 Nicotine dependence, unspecified, uncomplicated: Secondary | ICD-10-CM | POA: Insufficient documentation

## 2019-09-22 DIAGNOSIS — Z87891 Personal history of nicotine dependence: Secondary | ICD-10-CM

## 2019-09-22 DIAGNOSIS — R0683 Snoring: Secondary | ICD-10-CM

## 2019-09-22 DIAGNOSIS — R0600 Dyspnea, unspecified: Secondary | ICD-10-CM | POA: Insufficient documentation

## 2019-09-22 DIAGNOSIS — Z789 Other specified health status: Secondary | ICD-10-CM | POA: Diagnosis not present

## 2019-09-22 DIAGNOSIS — I5032 Chronic diastolic (congestive) heart failure: Secondary | ICD-10-CM | POA: Diagnosis not present

## 2019-09-22 DIAGNOSIS — R0609 Other forms of dyspnea: Secondary | ICD-10-CM

## 2019-09-22 LAB — CBC WITH DIFFERENTIAL/PLATELET
Abs Immature Granulocytes: 0.02 10*3/uL (ref 0.00–0.07)
Basophils Absolute: 0.1 10*3/uL (ref 0.0–0.1)
Basophils Relative: 1 %
Eosinophils Absolute: 0.3 10*3/uL (ref 0.0–0.5)
Eosinophils Relative: 4 %
HCT: 37.6 % (ref 36.0–46.0)
Hemoglobin: 11.2 g/dL — ABNORMAL LOW (ref 12.0–15.0)
Immature Granulocytes: 0 %
Lymphocytes Relative: 18 %
Lymphs Abs: 1.4 10*3/uL (ref 0.7–4.0)
MCH: 26 pg (ref 26.0–34.0)
MCHC: 29.8 g/dL — ABNORMAL LOW (ref 30.0–36.0)
MCV: 87.2 fL (ref 80.0–100.0)
Monocytes Absolute: 0.6 10*3/uL (ref 0.1–1.0)
Monocytes Relative: 8 %
Neutro Abs: 5.5 10*3/uL (ref 1.7–7.7)
Neutrophils Relative %: 69 %
Platelets: 189 10*3/uL (ref 150–400)
RBC: 4.31 MIL/uL (ref 3.87–5.11)
RDW: 14.7 % (ref 11.5–15.5)
WBC: 8 10*3/uL (ref 4.0–10.5)
nRBC: 0 % (ref 0.0–0.2)

## 2019-09-22 LAB — BRAIN NATRIURETIC PEPTIDE: B Natriuretic Peptide: 54.4 pg/mL (ref 0.0–100.0)

## 2019-09-22 LAB — COMPREHENSIVE METABOLIC PANEL
ALT: 15 U/L (ref 0–44)
AST: 14 U/L — ABNORMAL LOW (ref 15–41)
Albumin: 3.5 g/dL (ref 3.5–5.0)
Alkaline Phosphatase: 87 U/L (ref 38–126)
Anion gap: 12 (ref 5–15)
BUN: 13 mg/dL (ref 6–20)
CO2: 33 mmol/L — ABNORMAL HIGH (ref 22–32)
Calcium: 9.1 mg/dL (ref 8.9–10.3)
Chloride: 93 mmol/L — ABNORMAL LOW (ref 98–111)
Creatinine, Ser: 0.9 mg/dL (ref 0.44–1.00)
GFR calc Af Amer: >60 mL/min (ref >60)
GFR calc non Af Amer: >60 mL/min (ref >60)
Glucose, Bld: 105 mg/dL — ABNORMAL HIGH (ref 70–99)
Potassium: 4.2 mmol/L (ref 3.5–5.1)
Sodium: 138 mmol/L (ref 135–145)
Total Bilirubin: 0.8 mg/dL (ref 0.3–1.2)
Total Protein: 7.3 g/dL (ref 6.5–8.1)

## 2019-09-22 NOTE — Assessment & Plan Note (Signed)
Shortness of breath is likely multifactorial given the fact the patient has: Congestive heart failure with reduced ejection fraction, super morbid obesity with a BMI of 62.6, suspected restrictive lung disease, suspected obstructive lung disease, current vaping, former smoker, physically deconditioned  I have reviewed this with the patient.  I believe her shortness of breath today is directly related to a 31 pound weight increase over the last 2 months.  We will perform lab work as well as chest x-rays today to evaluate her worsened shortness of breath  Plan: Chest x-ray today Lab work today We will order pulmonary function testing We will order 6-minute walk Encourage patient to follow back up with cardiology We will refer back to lung cancer screening program

## 2019-09-22 NOTE — Assessment & Plan Note (Signed)
Suspected obstructive sleep apnea  Plan: Complete split-night sleep study next week as planned

## 2019-09-22 NOTE — Progress Notes (Signed)
@Patient  ID: , female    DOB: 09-Sep-1960, 59 y.o.   MRN: [id number]  Chief Complaint  Patient presents with  . Follow-up    shortness of breath with exertion    Referring provider: Center, [id number] Co*  HPI:  59 year old female former smoker followed in our office for dyspnea on exertion, suspected COPD and suspected obstructive sleep apnea  PMH: Congestive heart failure with reduced ejection fraction, lymphedema, aortic stenosis, hypertension Smoker/ Smoking History: Former smoker Maintenance: Dulera 200, Spiriva HandiHaler Pt of: Needs [place] pulmonologist  09/22/2019  - Visit   59 year old female former smoker initially referred to our office back in May/2021.  She was seen by Dr. Jun/2021.  She is found to have shortness of breath and dyspnea on exertion as well as suspected obstructive sleep apnea.  Her inhalers were switched from Spiriva Respimat to Trelegy Ellipta.  It was recommended that she follow-up in 3 months.  She is encouraged to increase physical activity to work on weight loss measures and to continue her heart failure medications.  The trelegy Ellipta was not covered by her insurance.  Patient was switched back to [facility] 200 as well as Spiriva HandiHaler.  Since last being seen patient was seen by her cardiologist nurse practitioner White on 08/08/2019 at the [place] [facility].  Summary of that assessment and plan as listed below:  Assessment   60 y.o. female with  1. Chronic diastolic (congestive) heart failure (CMS-HCC)  2. S/P TAVR (transcatheter aortic valve replacement)  3. Chronic dyspnea  4. Essential hypertension  5. Class 3 severe obesity due to excess calories with serious comorbidity and body mass index (BMI) of 60.0 to 69.9 in adult (CMS-HCC)  6. Tobacco use  7. Peripheral edema  8. History of depression  9. COPD with acute exacerbation (CMS-HCC)  10. Thyroid disease  11. Anemia, unspecified type   Ms. Zuk is a 59 y.o.female  patient with PMH significant for chronic diastolic heart failure, nonrheumatic aortic valve stenosis s/p TAVR, hypertension, thyroid disease, arthritis, COPD (on chronic supplemental O2 at 3L via [place]) obesity and tobacco use who presents today for follow-up from recent echocardiogram with reports an improvement in her peripheral edema as well as her dyspnea.The echocardiogram revealed moderate LV systolic dysfunction with mild LVH and an estimated EF of 30% which has slightly changed since the previous echocardiogram in 12/2018. The patient appears chronically ill, she appears euvolemic and is in no apparent distress.   Plan  1. Chronic diastolic CHF with an estimated EF of 30%, patient appears euvolemic on today, reasonably stable at this time, recent BNP = 45 -We will continue Entresto to 49/51 mg twice daily. Recent BUN = 16 and creatinine = 0.8 -We will continue metoprolol at a dose of 12.5mg  daily and spironolactone 12.5 once daily. -We will continue Lasix 40 mg once daily. -Thoroughly reinforced the following education to the patient:  Please restrict fluids: 2 liters per day. This includes all fluids: ice, water, milk, juice, coffee, tea, etc  Please restrict sodium. Aim for no more than 1.5 grams (1,500 mgs) per day. Read food labels for salt content in all foods.  Weigh yourself daily and record. If your weight increases by greater than 2 lbs in one day or 5 lbs in one week, call the office at [phone].  Please exercise. Aim for walking 20 minutes, 5 days per week Focus on obtaining a healthy weight. Additional weight places significant stress on your heart.  2. S/P  TAVR due to nonrheumatic aortic stenosis, stable, patient completed Plavix therapy -We will continue aspirin 81 mg once daily for antiplatelet therapy.  3. Shortness of breath, chronic, reasonably stable -Recommend continuing with supplemental oxygen use at all times and frequent monitoring of oxygen  saturation. -Recommend following up with pulmonary as scheduled.  4. Hypertension, reasonably controlled, patient is normotensive on today -We will continue management with metoprolol and Entresto. -Recommend following DASH diet.  5. Obesity likely due to excess caloric intake and sedentary lifestyle -Recommend significant weight loss, portion control, daily aerobic exercise for least 30 minutes as tolerated  6. Tobacco use with vapes, chronic -Recommend vape and tobacco cessation.  7. Peripheral edema, chronic, reasonably stable -We will continue diuresis with Lasix and spironolactone at this time. -Recommend extremity elevation while sitting, the use of compression socks and following a low-sodium diet.  8. Depression, chronic, stable -Management per PCP with Prozac, bupropion and trazodone.  9. COPD with respiratory failure, patient on chronic supplemental oxygen, stable -Management per pulmonary. -Recommend following up with pulmonary soon as possible.  10. Thyroid disease, recent stable -Management per PCP with levothyroxine.  11. Anemia, stable, most recent hemoglobin = 11.1 -Recommend continuing supplemental iron daily.  Return in about 4 months (around 12/08/2019). Discussed return precautions with patient for acute and chronic medical issues.   I personally performed the service, non-incident to. (WP)   DELICIA MARIA WHITE, NP  Patient reporting to our office today for follow-up.  She felt that the Trelegy Ellipta was helpful for her breathing.  Unfortunately her insurance and coverage for now she is back to Spiriva HandiHaler and Dulera 200.  She does not feel that this helps as well.  She feels that her breathing is progressively getting worse.  Of note her weight today is up 31 pounds since last being seen in May/2021.  Patient reports that she does not weigh herself regularly because she gets "frustrated".  Patient is a former smoker and stopped smoking in  September/2020 but still uses electronic cigarettes 2 times a day.  She does not feel that this is a "habit".  She admits that electronic cigarette use is directly related to stress.  She has an upcoming scheduled split-night sleep study next Thursday.  She reports adherence to her diuretics as well as her cardiology medications.  She has not notified them that her weights are up.  We will discuss this today.  Patient is maintained on 3 L pulsed today via her POC.  She "does not want to be on oxygen for the rest of her life".  We will discuss and review this today.  Patient was previously established in the lung cancer screening program at [facility].  She has not had a repeat CT since January/2020.  That was a lung RADS 1.  Is recommended to have annual follow-up.  She is willing to establish with [place] [facility].  We will discuss this today.  Questionaires / Pulmonary Flowsheets:   ACT:  No flowsheet data found.  MMRC: No flowsheet data found.  Epworth:  No flowsheet data found.  Tests:    03/25/2018-echocardiogram-LV ejection fraction 30 to 35%, grade 1 diastolic dysfunction, mild aortic valve regurgitation,  FENO:  No results found for: NITRICOXIDE  PFT: No flowsheet data found.  WALK:  No flowsheet data found.  Imaging: No results found.  Lab Results:  CBC    Component Value Date/Time   WBC 8.0 02/28/2018 1925   RBC 4.16 02/28/2018 1925   HGB 12.0  02/28/2018 1925   HGB 13.9 02/27/2013 1054   HCT 38.6 02/28/2018 1925   HCT 41.9 02/27/2013 1054   PLT 175 02/28/2018 1925   PLT 156 02/27/2013 1054   MCV 92.8 02/28/2018 1925   MCV 86 02/27/2013 1054   MCH 28.8 02/28/2018 1925   MCHC 31.1 02/28/2018 1925   RDW 13.4 02/28/2018 1925   RDW 13.7 02/27/2013 1054   LYMPHSABS 1.6 02/28/2018 1925   LYMPHSABS 2.0 02/27/2013 1054   MONOABS 0.5 02/28/2018 1925   MONOABS 0.6 02/27/2013 1054   EOSABS 0.3 02/28/2018 1925   EOSABS 0.5 02/27/2013 1054   BASOSABS 0.0 02/28/2018  1925   BASOSABS 0.1 02/27/2013 1054    BMET    Component Value Date/Time   NA 136 02/28/2018 1925   NA 136 02/27/2013 1054   K 4.3 02/28/2018 1925   K 4.2 02/27/2013 1054   CL 99 02/28/2018 1925   CL 105 02/27/2013 1054   CO2 31 02/28/2018 1925   CO2 32 02/27/2013 1054   GLUCOSE 116 (H) 02/28/2018 1925   GLUCOSE 100 (H) 02/27/2013 1054   BUN 14 02/28/2018 1925   BUN 11 02/27/2013 1054   CREATININE 0.79 02/28/2018 1925   CREATININE 0.72 02/27/2013 1054   CALCIUM 8.9 02/28/2018 1925   CALCIUM 9.1 02/27/2013 1054   GFRNONAA >60 02/28/2018 1925   GFRNONAA >60 02/27/2013 1054   GFRAA >60 02/28/2018 1925   GFRAA >60 02/27/2013 1054    BNP    Component Value Date/Time   BNP 45.0 07/05/2019 0327    ProBNP No results found for: PROBNP  Specialty Problems      Pulmonary Problems   Snoring   Dyspnea on exertion      No Known Allergies   There is no immunization history on file for this patient.  Past Medical History:  Diagnosis Date  . CHF (congestive heart failure) (HCC)    probable, takes HCTZ as a "fluid pill"  . COPD (chronic obstructive pulmonary disease) (HCC)    probable, no specific diagnosis  . Depression    take fluoxetine  . Hypertension   . Morbid obesity (HCC)   . Thyroid disease    hypothryoidism    Tobacco History: Social History   Tobacco Use  Smoking Status Former Smoker  . Packs/day: 1.50  . Years: 45.00  . Pack years: 67.50  . Types: Cigarettes  . Start date: 03/02/1973  . Quit date: 11/01/2018  . Years since quitting: 0.8  Smokeless Tobacco Never Used  Tobacco Comment   Vaping twice a day now 09/22/2019   Counseling given: Not Answered Comment: Vaping twice a day now 09/22/2019  Smoking assessment and cessation counseling  Patient currently smoking: 2 vaping episodes a day  I have advised the patient to quit/stop smoking as soon as possible due to high risk for multiple medical problems.  It will also be very difficult for [place]  to manage patient's  respiratory symptoms and status if we continue to expose her lungs to a known irritant.  We do not advise e-cigarettes as a form of stopping smoking.  Patient is  willing to quit smoking.  I have advised the patient that we can assist and have options of nicotine replacement therapy, provided smoking cessation education today, provided smoking cessation counseling, and provided cessation resources.  Follow-up next office visit office visit for assessment of smoking cessation.    Smoking cessation counseling advised for: 4 min     Outpatient Encounter Medications as  of 09/22/2019  Medication Sig  . albuterol (PROVENTIL HFA;VENTOLIN HFA) 108 (90 Base) MCG/ACT inhaler Inhale 2-4 puffs by mouth every 4 hours as needed for wheezing, cough, and/or shortness of breath (Patient taking differently: Inhale 2 puffs into the lungs every 4 (four) hours as needed for wheezing or shortness of breath. Inhale 2-4 puffs by mouth every 4 hours as needed for wheezing, cough, and/or shortness of breath)  . buPROPion (WELLBUTRIN XL) 150 MG 24 hr tablet Take 150 mg by mouth daily.  Marland Kitchen. FLUoxetine (PROZAC) 40 MG capsule Take 40 mg by mouth daily.  . furosemide (LASIX) 20 MG tablet Take 20 mg by mouth daily.  Marland Kitchen. levothyroxine (SYNTHROID, LEVOTHROID) 75 MCG tablet Take 75 mcg by mouth daily before breakfast.   . mometasone-formoterol (DULERA) 200-5 MCG/ACT AERO Inhale 2 puffs into the lungs 2 (two) times daily.  Marland Kitchen. spironolactone (ALDACTONE) 25 MG tablet Take 25 mg by mouth daily.  Marland Kitchen. tiotropium (SPIRIVA) 18 MCG inhalation capsule Place 1 capsule (18 mcg total) into inhaler and inhale daily.  . traZODone (DESYREL) 100 MG tablet Take 100 mg by mouth at bedtime.  . clopidogrel (PLAVIX) 75 MG tablet Take 75 mg by mouth daily.  . metoprolol succinate (TOPROL-XL) 25 MG 24 hr tablet Take 25 mg by mouth daily.   No facility-administered encounter medications on file as of 09/22/2019.     Review of  Systems  Review of Systems  Constitutional: Positive for fatigue. Negative for activity change and fever.  HENT: Negative for sinus pressure, sinus pain and sore throat.   Respiratory: Positive for shortness of breath. Negative for cough and wheezing.   Cardiovascular: Positive for leg swelling. Negative for chest pain and palpitations.  Gastrointestinal: Negative for diarrhea, nausea and vomiting.  Musculoskeletal: Negative for arthralgias.  Neurological: Negative for dizziness.  Psychiatric/Behavioral: Negative for sleep disturbance. The patient is not nervous/anxious.      Physical Exam  BP 128/72 (BP Location: Left Wrist, Cuff Size: Large)   Pulse 65   Temp 97.6 F (36.4 C) (Temporal)   Ht 5\' 2"  (1.575 m)   Wt (!) 342 lb 6.4 oz (155.3 kg)   SpO2 97% Comment: 3L O2  BMI 62.63 kg/m   Wt Readings from Last 5 Encounters:  09/22/19 (!) 342 lb 6.4 oz (155.3 kg)  07/21/19 (!) 311 lb 4.8 oz (141.2 kg)  12/01/18 (!) 339 lb 2 oz (153.8 kg)  08/30/18 (!) 320 lb (145.2 kg)  06/28/18 (!) 325 lb (147.4 kg)   Discussed weight increases with patient today in office  BMI Readings from Last 5 Encounters:  09/22/19 62.63 kg/m  07/21/19 56.94 kg/m  12/01/18 62.03 kg/m  08/30/18 58.53 kg/m  06/28/18 59.44 kg/m     Physical Exam Vitals and nursing note reviewed.  Constitutional:      General: She is not in acute distress.    Appearance: Normal appearance. She is obese.  HENT:     Head: Normocephalic and atraumatic.     Right Ear: External ear normal.     Left Ear: External ear normal.  Eyes:     Pupils: Pupils are equal, round, and reactive to light.  Cardiovascular:     Rate and Rhythm: Normal rate and regular rhythm.     Pulses: Normal pulses.     Heart sounds: Normal heart sounds. No murmur heard.   Pulmonary:     Effort: Pulmonary effort is normal. No respiratory distress.     Breath sounds: No decreased air movement.  No decreased breath sounds, wheezing or rales.   Abdominal:     General: Abdomen is flat. Bowel sounds are normal.     Palpations: Abdomen is soft.  Musculoskeletal:     Cervical back: Normal range of motion.     Right lower leg: Edema (1+) present.     Left lower leg: Edema (1+) present.  Skin:    General: Skin is warm and dry.     Capillary Refill: Capillary refill takes less than 2 seconds.  Neurological:     General: No focal deficit present.     Mental Status: She is alert and oriented to person, place, and time. Mental status is at baseline.     Gait: Gait normal.  Psychiatric:        Mood and Affect: Mood normal.        Behavior: Behavior normal.        Thought Content: Thought content normal.        Judgment: Judgment normal.       Assessment & Plan:   Chronic diastolic (congestive) heart failure (HCC) Followed by [facility] cardiology I believe this is the largest component to patient's shortness of breath Patient appears to be fluid overloaded with a weight increase of 31 pounds over the last 2 months Patient does not weigh herself regularly She reports adherence to diuretics  Plan: Lab work today Chest x-ray today Follow-up immediately with cardiology Would encourage patient to consider cardiopulmonary rehab Start weighing yourself daily in the morning  Dyspnea on exertion Shortness of breath is likely multifactorial given the fact the patient has: Congestive heart failure with reduced ejection fraction, super morbid obesity with a BMI of 62.6, suspected restrictive lung disease, suspected obstructive lung disease, current vaping, former smoker, physically deconditioned  I have reviewed this with the patient.  I believe her shortness of breath today is directly related to a 31 pound weight increase over the last 2 months.  We will perform lab work as well as chest x-rays today to evaluate her worsened shortness of breath  Plan: Chest x-ray today Lab work today We will order pulmonary function  testing We will order 6-minute walk Encourage patient to follow back up with cardiology We will refer back to lung cancer screening program  Electronic cigarette use Emphasized the patient that is important that she stops electronic cigarette use Explained to patient if she continues to vape this works directly against is trying to manage her breathing  Plan: Emphasized importance of stopping vaping, patient reports that she plans to stop ASAP  Former smoker Former smoker Quit smoking September/2020 67-pack-year smoking history Lung RADS 1 in [facility] lung cancer screening program in January/2020  Plan: We will refer to lung cancer screening program and [place]  Snoring Suspected obstructive sleep apnea  Plan: Complete split-night sleep study next week as planned    Return in about 3 months (around 12/23/2019), or if symptoms worsen or fail to improve, for [facility] - Dr. Belia Heman, [facility] - Dr. Jayme Cloud, With walk in office.   Coral Ceo, NP 09/22/2019   This appointment required 42 minutes of patient care (this includes precharting, chart review, review of results, face-to-face care, etc.).

## 2019-09-22 NOTE — Assessment & Plan Note (Signed)
Former smoker Quit smoking September/2020 67-pack-year smoking history Lung RADS 1 in [facility] lung cancer screening program in January/2020  Plan: We will refer to lung cancer screening program and [place]

## 2019-09-22 NOTE — Addendum Note (Signed)
Addended by: Kevona Lupinacci M on: 09/22/2019 11:36 AM   Modules accepted: Orders  

## 2019-09-22 NOTE — Patient Instructions (Addendum)
You were seen today by Lauraine Rinne, NP  for:   1. Dyspnea on exertion  Chest x-ray today  Lab work today: proBNP, c-Met, CBC with differential  Schedule immediate follow-up with cardiology for further evaluation given your suspected weight gain as well as dyspnea on exertion  We will schedule you for pulmonary function testing to further evaluate your breathing at [facility]  We will schedule you for 6-minute walk to evaluate your oxygen needs in 4 to 6 weeks  Dulera 200 >>> 2 puffs in the morning right when you wake up, rinse out your mouth after use, 12 hours later 2 puffs, rinse after use >>> Take this daily, no matter what >>> This is not a rescue inhaler   Spiriva Handihaler 40mg capsule >>> take 2 puffs using the inhaler and 1 capsule daily  >>> rinse mouth out after use  >>> take daily no matter what  >>> this is not a rescue inhaler   Continue oxygen therapy as prescribed  >>>maintain oxygen saturations greater than 88 percent  >>>if unable to maintain oxygen saturations please contact the office  >>>do not smoke with oxygen  >>>can use nasal saline gel or nasal saline rinses to moisturize nose if oxygen causes dryness   2. Chronic diastolic (congestive) heart failure (HCC)  Please schedule immediate follow-up with cardiology as I have concerns that you are retaining too much fluid  We will order lab work today  As emphasized today it is extremely important that you weigh yourself every morning please see the information listed below  Review these instructions with cardiology and see if they would like to make any changes as they are the team that should be managing your heart failure  Please also discuss with cardiology if they feel he would be a good candidate for cardiopulmonary rehab  Patient Education for Congestive Heart Failure  Do the following things EVERY DAY:   1. Weigh yourself EVERY morning after you go to the bathroom but before you eat  or drink anything. Write this number down in a weight log/diary.    2. Take your medicines as prescribed. If you have concerns about your medications, please call [place] you stop taking them.    3. Eat low salt foods--Limit salt (sodium) to 2000 mg per day. This will help prevent your body from holding onto fluid. Read food labels as many processed foods have a lot of sodium, especially canned goods and prepackaged meats. If you would like some assistance choosing low sodium foods, we would be happy to set you up with a nutritionist.   4. Stay as active as you can everyday. Staying active will give you more energy and make your muscles stronger. Start with 5 minutes at a time and work your way up to 30 minutes a day. Break up your activities--do some in the morning and some in the afternoon. Start with 3 days per week and work your way up to 5 days as you can.  If you have chest pain, feel short of breath, dizzy, or lightheaded, STOP. If you don't feel better after a short rest, call 911. If you do feel better, call the office to let [place] you have symptoms with exercise.   5. Limit all fluids for the day to less than 2 liters. Fluid includes all drinks, coffee, juice, ice chips, soup, jello, and all other liquids.   3. Electronic cigarette use  Continuing to use an electronic cigarette will make  it difficult for [place] to manage your breathing  I fully support and encourage you to stop vaping as we discussed in office today  We recommend that you stop smoking.  >>>You need to set a quit date >>>If you have friends or family who smoke, let them know you are trying to quit and not to smoke around you or in your living environment  Smoking Cessation Resources:  1 800 QUIT NOW  >>> Patient to call this resource and utilize it to help support her quit smoking >>> Keep up your hard work with stopping smoking  You can also contact the [facility] >>>For smoking cessation classes  call [phone]  We do not recommend using e-cigarettes as a form of stopping smoking  You can sign up for smoking cessation support texts and information:  >>>https://smokefree.gov/smokefreetxt   4. Former smoker  Continue to not smoke  We will refer you to the lung cancer screening program at [facility] regional today  5. Suspected OSA   Complete split-night sleep study as ordered and scheduled  Follow Up:    Return in about 3 months (around 12/23/2019), or if symptoms worsen or fail to improve, for [facility] - Dr. Mortimer Fries, [facility] - Dr. Patsey Berthold, With walk in office.   Please do your part to reduce the spread of COVID-19:      Reduce your risk of any infection  and COVID19 by using the similar precautions used for avoiding the common cold or flu:  Marland Kitchen Wash your hands often with soap and warm water for at least 20 seconds.  If soap and water are not readily available, use an alcohol-based hand sanitizer with at least 60% alcohol.  . If coughing or sneezing, cover your mouth and nose by coughing or sneezing into the elbow areas of your shirt or coat, into a tissue or into your sleeve (not your hands). Langley Gauss A MASK when in public  . Avoid shaking hands with others and consider head nods or verbal greetings only. . Avoid touching your eyes, nose, or mouth with unwashed hands.  . Avoid close contact with people who are sick. . Avoid places or events with large numbers of people in one location, like concerts or sporting events. . If you have some symptoms but not all symptoms, continue to monitor at home and seek medical attention if your symptoms worsen. . If you are having a medical emergency, call 911.   [place] / e-Visit: [email or web address]         [facility]: [facility]: [phone]                   [facility]: [phone]     It is flu season:   >>> Best ways to protect herself from the flu: Receive the yearly flu vaccine, practice good hand hygiene washing with soap and also using hand sanitizer when available, eat a nutritious meals, get adequate rest, hydrate appropriately   Please contact the office if your symptoms worsen or you have concerns that you are not improving.   Thank you for choosing [facility] for your healthcare, and for allowing [place] to partner with you on your healthcare journey. I am thankful to be able to provide care to you today.   Wyn Quaker FNP-C

## 2019-09-22 NOTE — Assessment & Plan Note (Signed)
Emphasized the patient that is important that she stops electronic cigarette use Explained to patient if she continues to vape this works directly against is trying to manage her breathing  Plan: Emphasized importance of stopping vaping, patient reports that she plans to stop ASAP

## 2019-09-22 NOTE — Addendum Note (Signed)
Addended by: Erling Conte on: 09/22/2019 11:36 AM   Modules accepted: Orders

## 2019-09-22 NOTE — Assessment & Plan Note (Signed)
Followed by [facility] cardiology I believe this is the largest component to patient's shortness of breath Patient appears to be fluid overloaded with a weight increase of 31 pounds over the last 2 months Patient does not weigh herself regularly She reports adherence to diuretics  Plan: Lab work today Chest x-ray today Follow-up immediately with cardiology Would encourage patient to consider cardiopulmonary rehab Start weighing yourself daily in the morning

## 2019-09-26 ENCOUNTER — Other Ambulatory Visit
Admission: RE | Admit: 2019-09-26 | Discharge: 2019-09-26 | Disposition: A | Discharge status: Home / Self Care | Payer: Medicaid Other | Source: Ambulatory Visit | Attending: Student | Admitting: Student

## 2019-09-26 DIAGNOSIS — I8290 Acute embolism and thrombosis of unspecified vein: Secondary | ICD-10-CM | POA: Diagnosis not present

## 2019-09-26 LAB — FIBRIN DERIVATIVES D-DIMER (ARMC ONLY): Fibrin derivatives D-dimer (ARMC): 679.78 ng/mL (FEU) — ABNORMAL HIGH (ref 0.00–499.00)

## 2019-09-28 ENCOUNTER — Telehealth: Payer: Self-pay

## 2019-09-28 ENCOUNTER — Ambulatory Visit: Payer: Medicaid Other | Attending: Pulmonary Disease

## 2019-09-28 DIAGNOSIS — I509 Heart failure, unspecified: Secondary | ICD-10-CM | POA: Diagnosis not present

## 2019-09-28 DIAGNOSIS — I110 Hypertensive heart disease with heart failure: Secondary | ICD-10-CM | POA: Diagnosis not present

## 2019-09-28 DIAGNOSIS — Z6841 Body Mass Index (BMI) 40.0 and over, adult: Secondary | ICD-10-CM | POA: Insufficient documentation

## 2019-09-28 DIAGNOSIS — G4733 Obstructive sleep apnea (adult) (pediatric): Secondary | ICD-10-CM | POA: Diagnosis not present

## 2019-09-28 DIAGNOSIS — G4736 Sleep related hypoventilation in conditions classified elsewhere: Secondary | ICD-10-CM | POA: Diagnosis not present

## 2019-09-28 NOTE — Telephone Encounter (Signed)
Pt is aware of date/time of covid test prior to PFT.  

## 2019-09-29 ENCOUNTER — Other Ambulatory Visit: Payer: Self-pay

## 2019-09-29 ENCOUNTER — Telehealth: Payer: Self-pay | Admitting: Pulmonary Disease

## 2019-09-29 ENCOUNTER — Telehealth: Payer: Self-pay

## 2019-09-29 ENCOUNTER — Other Ambulatory Visit
Admission: RE | Admit: 2019-09-29 | Discharge: 2019-09-29 | Disposition: A | Discharge status: Home / Self Care | Payer: Medicaid Other | Source: Ambulatory Visit | Attending: Pulmonary Disease | Admitting: Pulmonary Disease

## 2019-09-29 DIAGNOSIS — G4733 Obstructive sleep apnea (adult) (pediatric): Secondary | ICD-10-CM

## 2019-09-29 DIAGNOSIS — Z20822 Contact with and (suspected) exposure to covid-19: Secondary | ICD-10-CM | POA: Insufficient documentation

## 2019-09-29 DIAGNOSIS — Z01812 Encounter for preprocedural laboratory examination: Secondary | ICD-10-CM | POA: Insufficient documentation

## 2019-09-29 LAB — SARS CORONAVIRUS 2 (TAT 6-24 HRS): SARS Coronavirus 2: NEGATIVE

## 2019-09-29 NOTE — Telephone Encounter (Signed)
09/29/2019  Please let the patient know that I have received the results of her split-night sleep study from Dr. Vassie Loll.  They are listed below:  Per split study, severe OSA  Auto bipap + O2  IPAP max 22, PS +4, EPAP min 10 cm    Please let the patient know that she has severe obstructive sleep apnea.  Dr. Vassie Loll is recommending auto BiPAP.  See the settings above.  Continue oxygen therapy at night.  Elisha Headland, FNP

## 2019-09-29 NOTE — Progress Notes (Signed)
SARS-CoV-2 test is negative.  This is good news.  Proceed forward with work-up as planned.  Bonney Berres, FNP 

## 2019-09-29 NOTE — Telephone Encounter (Signed)
Called pt to schedule SMW test. Pt stated that she would call back to schedule due to transportation.

## 2019-10-02 ENCOUNTER — Ambulatory Visit: Payer: Medicaid Other | Attending: Pulmonary Disease

## 2019-10-02 ENCOUNTER — Other Ambulatory Visit: Payer: Self-pay

## 2019-10-02 DIAGNOSIS — R0600 Dyspnea, unspecified: Secondary | ICD-10-CM | POA: Diagnosis present

## 2019-10-02 DIAGNOSIS — R0609 Other forms of dyspnea: Secondary | ICD-10-CM

## 2019-10-02 DIAGNOSIS — F17210 Nicotine dependence, cigarettes, uncomplicated: Secondary | ICD-10-CM | POA: Insufficient documentation

## 2019-10-02 MED ORDER — ALBUTEROL SULFATE (2.5 MG/3ML) 0.083% IN NEBU
2.5000 mg | INHALATION_SOLUTION | Freq: Once | RESPIRATORY_TRACT | Status: AC
  Administered 2019-10-02: 2.5 mg via RESPIRATORY_TRACT
  Filled 2019-10-02: qty 3

## 2019-10-02 NOTE — Telephone Encounter (Signed)
Pt is aware of results and voiced her understanding.  Order has been placed for bipap. Pt has been scheduled for OV on 11/14/2019 with Elisha Headland, NP for compliance.  Nothing further is needed.

## 2019-10-03 ENCOUNTER — Telehealth: Payer: Self-pay | Admitting: Pulmonary Disease

## 2019-10-03 DIAGNOSIS — R918 Other nonspecific abnormal finding of lung field: Secondary | ICD-10-CM

## 2019-10-03 DIAGNOSIS — R0609 Other forms of dyspnea: Secondary | ICD-10-CM

## 2019-10-03 DIAGNOSIS — R0600 Dyspnea, unspecified: Secondary | ICD-10-CM

## 2019-10-03 DIAGNOSIS — J984 Other disorders of lung: Secondary | ICD-10-CM

## 2019-10-03 DIAGNOSIS — R0602 Shortness of breath: Secondary | ICD-10-CM

## 2019-10-03 NOTE — Telephone Encounter (Addendum)
Pt reviewed PFT results via mychart. She is concerned about possible ILD being mentioned in results.  She is questioning if labs that were drawn at last OV would show cystic fibrosis.  Pt is aware that Arlys John will not be back until 10/05/2019.   Arlys John, please advise. thanks

## 2019-10-04 NOTE — Telephone Encounter (Signed)
Waynetta Sandy will you please sign this BIPAP order since Arlys John is on vacation. Thank you!!

## 2019-10-04 NOTE — Addendum Note (Signed)
Addended by: Benjie Karvonen R on: 10/04/2019 02:06 PM   Modules accepted: Orders

## 2019-10-04 NOTE — Telephone Encounter (Signed)
Yes

## 2019-10-04 NOTE — Telephone Encounter (Signed)
10/04/2019  I have reviewed patient's pulmonary function test those results are listed below:  10/02/2019-pulmonary function test-FVC 1.75 (56% predicted), postbronchodilator ratio 81, postbronchodilator FEV1 1.46 (61% predicted), TLC 3.11 (65% predicted), DLCO 9.30 (49% predicted)  Patient's pulmonary function test does show restriction.  This could be directly related to her morbid obesity.  We can further evaluate concerns for interstitial lung disease with high-resolution CT imaging.  It is reassuring though that her breast recent chest x-ray that we have on file did not show acute abnormalities besides vascular congestion for known congestive heart failure.  I have ordered a high-resolution CT chest.  Elisha Headland, FNP

## 2019-10-05 ENCOUNTER — Telehealth: Payer: Self-pay | Admitting: *Deleted

## 2019-10-05 NOTE — Telephone Encounter (Signed)
Obtained referral for lung screening. Patient not contacted at this time due to noted plan for Hi Res CT scan. Will follow with lung screening when appropriate.

## 2019-10-05 NOTE — Telephone Encounter (Signed)
Lm for pt

## 2019-10-05 NOTE — Telephone Encounter (Signed)
Spoke to pt and relayed below results.  Pt voiced her understanding and had no further questions.  Nothing further is needed.

## 2019-10-05 NOTE — Telephone Encounter (Signed)
Pt called back, please return call anytime today 

## 2019-10-07 ENCOUNTER — Other Ambulatory Visit: Payer: Self-pay | Admitting: Student

## 2019-10-07 DIAGNOSIS — R7989 Other specified abnormal findings of blood chemistry: Secondary | ICD-10-CM

## 2019-10-10 ENCOUNTER — Ambulatory Visit: Payer: Medicaid Other

## 2019-10-13 ENCOUNTER — Telehealth: Payer: Self-pay

## 2019-10-13 ENCOUNTER — Ambulatory Visit
Admission: RE | Admit: 2019-10-13 | Discharge: 2019-10-13 | Disposition: A | Discharge status: Home / Self Care | Payer: Medicaid Other | Source: Ambulatory Visit | Attending: Student | Admitting: Student

## 2019-10-13 ENCOUNTER — Other Ambulatory Visit: Payer: Self-pay

## 2019-10-13 DIAGNOSIS — R7989 Other specified abnormal findings of blood chemistry: Secondary | ICD-10-CM | POA: Insufficient documentation

## 2019-10-13 MED ORDER — IOHEXOL 350 MG/ML SOLN
100.0000 mL | Freq: Once | INTRAVENOUS | Status: AC | PRN
  Administered 2019-10-13: 100 mL via INTRAVENOUS

## 2019-10-13 NOTE — Telephone Encounter (Signed)
Created in error

## 2019-10-20 ENCOUNTER — Other Ambulatory Visit: Payer: Self-pay | Admitting: Student

## 2019-10-20 DIAGNOSIS — I208 Other forms of angina pectoris: Secondary | ICD-10-CM

## 2019-10-25 ENCOUNTER — Telehealth: Payer: Self-pay | Admitting: Pulmonary Disease

## 2019-10-25 DIAGNOSIS — R0602 Shortness of breath: Secondary | ICD-10-CM

## 2019-10-25 NOTE — Telephone Encounter (Signed)
Kristie Dorsey you ordered a CT Chest High Res on 10/04/2019 and this patient need up doing CT Angio Chest PE on 10/13/19 do we still need to schedule the high res CT

## 2019-10-26 NOTE — Telephone Encounter (Signed)
No need for high-resolution CT chest at this time.  Okay to cancel.  Elisha Headland, FNP

## 2019-10-26 NOTE — Addendum Note (Signed)
Addended by: Lajoyce Lauber A on: 10/26/2019 09:09 AM   Modules accepted: Orders

## 2019-10-26 NOTE — Telephone Encounter (Signed)
CTHR has been canceled.   Routing to [place] to make aware.

## 2019-11-08 ENCOUNTER — Other Ambulatory Visit: Payer: Self-pay

## 2019-11-08 ENCOUNTER — Ambulatory Visit
Admission: RE | Admit: 2019-11-08 | Discharge: 2019-11-08 | Disposition: A | Discharge status: Home / Self Care | Payer: Medicaid Other | Source: Ambulatory Visit | Attending: Student | Admitting: Student

## 2019-11-08 DIAGNOSIS — I208 Other forms of angina pectoris: Secondary | ICD-10-CM | POA: Insufficient documentation

## 2019-11-08 MED ORDER — TECHNETIUM TC 99M TETROFOSMIN IV KIT
30.0000 | PACK | Freq: Once | INTRAVENOUS | Status: AC | PRN
  Administered 2019-11-08: 30.978 via INTRAVENOUS

## 2019-11-08 MED ORDER — REGADENOSON 0.4 MG/5ML IV SOLN
0.4000 mg | Freq: Once | INTRAVENOUS | Status: AC
  Administered 2019-11-08: 0.4 mg via INTRAVENOUS

## 2019-11-09 ENCOUNTER — Encounter
Admission: RE | Admit: 2019-11-09 | Discharge: 2019-11-09 | Disposition: A | Discharge status: Home / Self Care | Payer: Medicaid Other | Source: Ambulatory Visit | Attending: Student | Admitting: Student

## 2019-11-09 MED ORDER — TECHNETIUM TC 99M TETROFOSMIN IV KIT
32.0800 | PACK | Freq: Once | INTRAVENOUS | Status: AC | PRN
  Administered 2019-11-09: 32.08 via INTRAVENOUS

## 2019-11-14 ENCOUNTER — Ambulatory Visit: Payer: Medicaid Other | Admitting: Pulmonary Disease

## 2019-11-21 ENCOUNTER — Other Ambulatory Visit: Payer: Self-pay | Admitting: Family Medicine

## 2019-11-21 DIAGNOSIS — N950 Postmenopausal bleeding: Secondary | ICD-10-CM

## 2019-11-21 LAB — NM MYOCAR MULTI W/SPECT W/WALL MOTION / EF
Estimated workload: 1 METS
Exercise duration (min): 1 min
Exercise duration (sec): 3 s
LV dias vol: 185 mL (ref 46–106)
LV sys vol: 126 mL
Peak HR: 77 {beats}/min
Percent HR: 48 %
Rest HR: 62 {beats}/min
SDS: 11
SRS: 16
SSS: 25
TID: 1.2

## 2019-11-27 ENCOUNTER — Other Ambulatory Visit: Payer: Self-pay

## 2019-11-27 ENCOUNTER — Ambulatory Visit
Admission: RE | Admit: 2019-11-27 | Discharge: 2019-11-27 | Disposition: A | Discharge status: Home / Self Care | Payer: Medicaid Other | Source: Ambulatory Visit | Attending: Family Medicine | Admitting: Family Medicine

## 2019-11-27 DIAGNOSIS — N950 Postmenopausal bleeding: Secondary | ICD-10-CM

## 2019-11-30 LAB — PULMONARY FUNCTION TEST ARMC ONLY
DL/VA % pred: 76 %
DL/VA: 3.28 ml/min/mmHg/L
DLCO unc % pred: 49 %
DLCO unc: 9.3 ml/min/mmHg
FEF 25-75 Post: 1.79 L/sec
FEF 25-75 Pre: 1.58 L/sec
FEF2575-%Change-Post: 13 %
FEF2575-%Pred-Post: 80 %
FEF2575-%Pred-Pre: 70 %
FEV1-%Change-Post: 1 %
FEV1-%Pred-Post: 61 %
FEV1-%Pred-Pre: 61 %
FEV1-Post: 1.46 L
FEV1-Pre: 1.45 L
FEV1FVC-%Change-Post: -2 %
FEV1FVC-%Pred-Pre: 106 %
FEV6-%Change-Post: 3 %
FEV6-%Pred-Post: 61 %
FEV6-%Pred-Pre: 59 %
FEV6-Post: 1.81 L
FEV6-Pre: 1.75 L
FEV6FVC-%Pred-Post: 103 %
FEV6FVC-%Pred-Pre: 103 %
FVC-%Change-Post: 3 %
FVC-%Pred-Post: 59 %
FVC-%Pred-Pre: 56 %
FVC-Post: 1.81 L
Post FEV1/FVC ratio: 81 %
Post FEV6/FVC ratio: 100 %
Pre FEV1/FVC ratio: 83 %
Pre FEV6/FVC Ratio: 100 %
RV % pred: 68 %
RV: 1.3 L
TLC % pred: 65 %
TLC: 3.11 L

## 2019-12-05 ENCOUNTER — Other Ambulatory Visit: Payer: Self-pay

## 2019-12-05 ENCOUNTER — Encounter (INDEPENDENT_AMBULATORY_CARE_PROVIDER_SITE_OTHER): Payer: Self-pay | Admitting: Vascular Surgery

## 2019-12-05 ENCOUNTER — Ambulatory Visit (INDEPENDENT_AMBULATORY_CARE_PROVIDER_SITE_OTHER): Payer: Medicaid Other | Admitting: Vascular Surgery

## 2019-12-05 VITALS — BP 150/80 | HR 77 | Resp 16 | Ht 62.0 in | Wt 333.2 lb

## 2019-12-05 DIAGNOSIS — I6523 Occlusion and stenosis of bilateral carotid arteries: Secondary | ICD-10-CM

## 2019-12-05 DIAGNOSIS — M79604 Pain in right leg: Secondary | ICD-10-CM | POA: Diagnosis not present

## 2019-12-05 DIAGNOSIS — F17200 Nicotine dependence, unspecified, uncomplicated: Secondary | ICD-10-CM

## 2019-12-05 DIAGNOSIS — I6529 Occlusion and stenosis of unspecified carotid artery: Secondary | ICD-10-CM | POA: Insufficient documentation

## 2019-12-05 DIAGNOSIS — I10 Essential (primary) hypertension: Secondary | ICD-10-CM | POA: Diagnosis not present

## 2019-12-05 DIAGNOSIS — M79609 Pain in unspecified limb: Secondary | ICD-10-CM | POA: Insufficient documentation

## 2019-12-05 NOTE — Progress Notes (Signed)
Patient ID: Kristie Dorsey, female   DOB: [birth year], 59 y.o.   MRN: [id number]  Chief Complaint  Patient presents with  . New Patient (Initial Visit)    ref [place] claudication    HPI Kristie Dorsey is a 59 y.o. female.  I am asked to see the patient by Dr. Juliann Pares for evaluation of leg pain and possible PAD.  The patient describes right leg pain with certain positions as well as with activity.  It sounds like laying flat or laying on her right hip precipitate the pain sometimes.  She does feel like her legs give out with walking particularly on the right side.  She has known arthritic disease of the knees as well.  No open wounds or infection.  No fevers or chills. She was also recently diagnosed with carotid disease by duplex.  She has not had any focal neurologic symptoms.  She describes being told she had about a 70% right carotid blockage and about a 50% left carotid blockage.  She does take aspirin daily.   Past Medical History:  Diagnosis Date  . CHF (congestive heart failure) (HCC)    probable, takes HCTZ as a "fluid pill"  . COPD (chronic obstructive pulmonary disease) (HCC)    probable, no specific diagnosis  . Depression    take fluoxetine  . Hypertension   . Morbid obesity (HCC)   . Thyroid disease    hypothryoidism    Past Surgical History:  Procedure Laterality Date  . CHOLECYSTECTOMY    . LEFT HEART CATH AND CORONARY ANGIOGRAPHY N/A 04/14/2018   Procedure: LEFT HEART CATH AND CORONARY ANGIOGRAPHY;  Surgeon: Alwyn Pea, MD;  Location: [facility] INVASIVE CV LAB;  Service: Cardiovascular;  Laterality: N/A;  . TONSILLECTOMY    . TUBAL LIGATION       Family History No bleeding or clotting disorders No aneurysms No autoimmune diseases   Social History   Tobacco Use  . Smoking status: Current Some Day Smoker    Packs/day: 1.50    Years: 45.00    Pack years: 67.50    Types: Cigarettes    Start date: 03/02/1973    Last attempt to quit: 11/01/2018     Years since quitting: 1.0  . Smokeless tobacco: Never Used  . Tobacco comment: Vaping twice a day now 09/22/2019  Vaping Use  . Vaping Use: Every day  . Start date: 11/01/2018  . Substances: Nicotine  Substance Use Topics  . Alcohol use: No  . Drug use: No     No Known Allergies  Current Outpatient Medications  Medication Sig Dispense Refill  . albuterol (PROVENTIL HFA;VENTOLIN HFA) 108 (90 Base) MCG/ACT inhaler Inhale 2-4 puffs by mouth every 4 hours as needed for wheezing, cough, and/or shortness of breath (Patient taking differently: Inhale 2 puffs into the lungs every 4 (four) hours as needed for wheezing or shortness of breath. Inhale 2-4 puffs by mouth every 4 hours as needed for wheezing, cough, and/or shortness of breath) 1 Inhaler 1  . buPROPion (WELLBUTRIN XL) 150 MG 24 hr tablet Take 150 mg by mouth daily.    Marland Kitchen FLUoxetine (PROZAC) 40 MG capsule Take 40 mg by mouth daily.    . furosemide (LASIX) 20 MG tablet Take 20 mg by mouth daily.    Marland Kitchen levothyroxine (SYNTHROID, LEVOTHROID) 75 MCG tablet Take 75 mcg by mouth daily before breakfast.     . mometasone-formoterol (DULERA) 200-5 MCG/ACT AERO Inhale 2 puffs into the lungs 2 (  two) times daily. 13 g 6  . spironolactone (ALDACTONE) 25 MG tablet Take 25 mg by mouth daily.    Marland Kitchen tiotropium (SPIRIVA) 18 MCG inhalation capsule Place 1 capsule (18 mcg total) into inhaler and inhale daily. 30 capsule 6  . traZODone (DESYREL) 100 MG tablet Take 100 mg by mouth at bedtime.    . clopidogrel (PLAVIX) 75 MG tablet Take 75 mg by mouth daily.    . metoprolol succinate (TOPROL-XL) 25 MG 24 hr tablet Take 25 mg by mouth daily.     No current facility-administered medications for this visit.      REVIEW OF SYSTEMS (Negative unless checked)  Constitutional: Weight loss  Fever  Chills Cardiac: Chest pain   Chest pressure   Palpitations   Shortness of breath when laying flat   Shortness of breath at rest   Shortness of breath  with exertion. Vascular:  Pain in legs with walking   Pain in legs at rest   Pain in legs when laying flat   Claudication   Pain in feet when walking  Pain in feet at rest  Pain in feet when laying flat   History of DVT   Phlebitis   Swelling in legs   Varicose veins   Non-healing ulcers Pulmonary:   Uses home oxygen   Productive cough   Hemoptysis   Wheeze  COPD   Asthma Neurologic:  Dizziness  Blackouts   Seizures   History of stroke   History of TIA  Aphasia   Temporary blindness   Dysphagia   Weakness or numbness in arms   Weakness or numbness in legs Musculoskeletal:  Arthritis   Joint swelling   Joint pain   Low back pain Hematologic:  Easy bruising  Easy bleeding   Hypercoagulable state   Anemic  Hepatitis Gastrointestinal:  Blood in stool   Vomiting blood  Gastroesophageal reflux/heartburn   Abdominal pain Genitourinary:  Chronic kidney disease   Difficult urination  Frequent urination  Burning with urination   Hematuria Skin:  Rashes   Ulcers   Wounds Psychological:  History of anxiety    History of major depression.    Physical Exam BP (!) 150/80 (BP Location: Right Arm)   Pulse 77   Resp 16   Ht  (1.575 m)   Wt (!) 333 lb 3.2 oz (151.1 kg)   BMI 60.94 kg/m  Gen:  WD/WN, NAD.  Morbidly obese Head: [place]/AT, No temporalis wasting.  Ear/Nose/Throat: Hearing grossly intact, nares w/o erythema or drainage, oropharynx w/o Erythema/Exudate Eyes: Conjunctiva clear, sclera non-icteric  Neck: trachea midline.  No JVD.  Pulmonary:  Good air movement, respirations not labored, no use of accessory muscles  Cardiac: RRR, no JVD Vascular:  Vessel Right Left  Radial Palpable Palpable                          DP  1+  2+  PT  1+  1+   Gastrointestinal:. No masses, surgical incisions, or scars. Musculoskeletal: M/S 5/5 throughout.  Extremities without ischemic changes.  No  deformity or atrophy.  Mild bilateral lower extremity edema. Neurologic: Sensation grossly intact in extremities.  Symmetrical.  Speech is fluent. Motor exam as listed above. Psychiatric: Judgment intact, Mood & affect appropriate for pt's clinical situation. Dermatologic: No rashes or ulcers noted.  No cellulitis or open wounds.    Radiology NM Myocar Multi W/Spect W/Wall Motion / EF  Result Date: 11/21/2019  Blood pressure demonstrated a normal response to exercise.  There was no ST segment deviation noted during stress.  No T wave inversion was noted during stress.  This is an intermediate risk study.  The left ventricular ejection fraction is moderately decreased (30-44%).  Nuclear stress EF: 30%.  Conclusion Adequate pharmacological stress await Myoview images   US PELVIC COMPLETE WITH TRANSVAGINAL  Result Date: 11/27/2019 CLINICAL DATA:  Postmenopausal bleeding EXAM: TRANSABDOMINAL AND TRANSVAGINAL ULTRASOUND OF PELVIS TECHNIQUE: Both transabdominal and transvaginal ultrasound examinations of the pelvis were performed. Transabdominal technique was performed for global imaging of the pelvis including uterus, ovaries, adnexal regions, and pelvic cul-de-sac. It was necessary to proceed with endovaginal exam following the transabdominal exam to visualize the uterus endometrium. COMPARISON:  None FINDINGS: Uterus Measurements: 11.5 x 5.3 x 6.2 cm = volume: 195.1 mL. Poorly visualized due to habitus and position of the uterus. No definitive focal mass. Endometrium Thickness: 10.8 mm.  No focal abnormality visualized. Right ovary Not seen Left ovary Not seen Other findings No abnormal free fluid. IMPRESSION: 1. Endometrial thickness of 10.8 mm. In the setting of post-menopausal bleeding, endometrial sampling is indicated to exclude carcinoma. If results are benign, sonohysterogram should be considered for focal lesion work-up. (Ref: Radiological Reasoning: Algorithmic Workup of Abnormal Vaginal  Bleeding with Endovaginal Sonography and Sonohysterography. AJR 2008; 161:[id number]) 2. Nonvisualized ovaries Electronically Signed   By: Jasmine PangKim  Fujinaga M.D.   On: 11/27/2019 15:33    Labs Recent Results (from the past 2160 hour(s))  CBC with Differential     Status: Abnormal   Collection Time: 09/22/19 11:41 AM  Result Value Ref Range   WBC 8.0 4.0 - 10.5 K/uL   RBC 4.31 3.87 - 5.11 MIL/uL   Hemoglobin 11.2 (L) 12.0 - 15.0 g/dL   HCT 54.037.6 36 - 46 %   MCV 87.2 80.0 - 100.0 fL   MCH 26.0 26.0 - 34.0 pg   MCHC 29.8 (L) 30.0 - 36.0 g/dL   RDW 98.114.7 19.111.5 - 47.815.5 %   Platelets 189 150 - 400 K/uL   nRBC 0.0 0.0 - 0.2 %   Neutrophils Relative % 69 %   Neutro Abs 5.5 1.7 - 7.7 K/uL   Lymphocytes Relative 18 %   Lymphs Abs 1.4 0.7 - 4.0 K/uL   Monocytes Relative 8 %   Monocytes Absolute 0.6 0 - 1 K/uL   Eosinophils Relative 4 %   Eosinophils Absolute 0.3 0 - 0 K/uL   Basophils Relative 1 %   Basophils Absolute 0.1 0 - 0 K/uL   Immature Granulocytes 0 %   Abs Immature Granulocytes 0.02 0.00 - 0.07 K/uL    Comment: Performed at [facility], [address] [place], [place] [id number]  Comprehensive metabolic panel     Status: Abnormal   Collection Time: 09/22/19 11:41 AM  Result Value Ref Range   Sodium 138 135 - 145 mmol/L   Potassium 4.2 3.5 - 5.1 mmol/L   Chloride 93 (L) 98 - 111 mmol/L   CO2 33 (H) 22 - 32 mmol/L   Glucose, Bld 105 (H) 70 - 99 mg/dL    Comment: Glucose reference range applies only to samples taken after fasting for at least 8 hours.   BUN 13 6 - 20 mg/dL   Creatinine, Ser 1.300.90 0.44 - 1.00 mg/dL   Calcium 9.1 8.9 - 86.510.3 mg/dL   Total Protein 7.3 6.5 - 8.1 g/dL   Albumin 3.5 3.5 - 5.0 g/dL   AST  14 (L) 15 - 41 U/L   ALT 15 0 - 44 U/L   Alkaline Phosphatase 87 38 - 126 U/L   Total Bilirubin 0.8 0.3 - 1.2 mg/dL   GFR calc non Af Amer >60 >60 mL/min   GFR calc Af Amer >60 >60 mL/min   Anion gap 12 5 - 15    Comment: Performed at [facility], [address] [place], [place] [address]  Brain natriuretic peptide     Status: None   Collection Time: 09/22/19 11:41 AM  Result Value Ref Range   B Natriuretic Peptide 54.4 0.0 - 100.0 pg/mL    Comment: Performed at [facility], [address] [place], [place] [address]  Fibrin derivatives D-Dimer [facility] only)     Status: Abnormal   Collection Time: 09/26/19 12:11 PM  Result Value Ref Range   Fibrin derivatives D-dimer ([facility]) 679.78 (H) 0.00 - 499.00 ng/mL (FEU)    Comment: (NOTE) <> Exclusion of Venous Thromboembolism (VTE) - OUTPATIENT ONLY   (Emergency Department or [place])    0-499 ng/ml (FEU): With a low to intermediate pretest probability                      for VTE this test result excludes the diagnosis                      of VTE.   >499 ng/ml (FEU) : VTE not excluded; additional work up for VTE is                      required.  <> Testing on Inpatients and Evaluation of Disseminated Intravascular   Coagulation (DIC) Reference Range:   0-499 ng/ml (FEU) Performed at [facility], [address] [place], [place] [address]   SARS CORONAVIRUS 2 (TAT 6-24 HRS) Nasopharyngeal Nasopharyngeal Swab     Status: None   Collection Time: 09/29/19  9:46 AM   Specimen: Nasopharyngeal Swab  Result Value Ref Range   SARS Coronavirus 2 NEGATIVE NEGATIVE    Comment: (NOTE) SARS-CoV-2 target nucleic acids are NOT DETECTED.  The SARS-CoV-2 RNA is generally detectable in upper and lower respiratory specimens during the acute phase of infection. Negative results do not preclude SARS-CoV-2 infection, do not rule out co-infections with other pathogens, and should not be used as the sole basis for treatment or other patient management decisions. Negative results must be combined with clinical observations, patient history, and epidemiological information. The expected result is Negative.  Fact Sheet for Patients: HairSlick.no  Fact  Sheet for Healthcare Providers: quierodirigir.com  This test is not yet approved or cleared by the [place] FDA and  has been authorized for detection and/or diagnosis of SARS-CoV-2 by FDA under an Emergency Use Authorization (EUA). This EUA will remain  in effect (meaning this test can be used) for the duration of the COVID-19 declaration under Se ction 564(b)(1) of the Act, 21 U.S.C. section 360bbb-3(b)(1), unless the authorization is terminated or revoked sooner.  Performed at [facility], [address] [place], [place] [address]   Pulmonary Function Test [facility] Only     Status: None   Collection Time: 10/02/19 12:30 PM  Result Value Ref Range   FVC-%Pred-Pre 56 %   FVC-Post 1.81 L   FVC-%Pred-Post 59 %   FVC-%Change-Post 3 %   FEV1-Pre 1.45 L   FEV1-%Pred-Pre 61 %   FEV1-Post 1.46 L  FEV1-%Pred-Post 61 %   FEV1-%Change-Post 1 %   FEV6-Pre 1.75 L   FEV6-%Pred-Pre 59 %   FEV6-Post 1.81 L   FEV6-%Pred-Post 61 %   FEV6-%Change-Post 3 %   Pre FEV1/FVC ratio 83 %   FEV1FVC-%Pred-Pre 106 %   Post FEV1/FVC ratio 81 %   FEV1FVC-%Change-Post -2 %   Pre FEV6/FVC Ratio 100 %   FEV6FVC-%Pred-Pre 103 %   Post FEV6/FVC ratio 100 %   FEV6FVC-%Pred-Post 103 %   FEF 25-75 Pre 1.58 L/sec   FEF2575-%Pred-Pre 70 %   FEF 25-75 Post 1.79 L/sec   FEF2575-%Pred-Post 80 %   FEF2575-%Change-Post 13 %   RV 1.30 L   RV % pred 68 %   TLC 3.11 L   TLC % pred 65 %   DLCO unc 9.30 ml/min/mmHg   DLCO unc % pred 49 %   DL/VA 5.62 ml/min/mmHg/L   DL/VA % pred 76 %  NM Myocar Multi W/Spect W/Wall Motion / EF     Status: None   Collection Time: 11/09/19 11:38 AM  Result Value Ref Range   Rest HR 62 bpm   Rest BP 133/60 mmHg   Exercise duration (sec) 3 sec   Percent HR 48 %   Exercise duration (min) 1 min   Estimated workload 1.0 METS   Peak HR 77 bpm   Peak BP 133/60 mmHg   SSS 25    SRS 16    SDS 11    TID 1.20    LV sys vol 126 mL   LV dias vol  185 46 - 106 mL    Assessment/Plan:  Tobacco use disorder We had a discussion for approximately 3-4 minutes regarding the absolute need for smoking cessation due to the deleterious nature of tobacco on the vascular system. We discussed the tobacco use would diminish patency of any intervention, and likely significantly worsen progressio of disease. We discussed multiple agents for quitting including replacement therapy or medications to reduce cravings such as Chantix. The patient voices their understanding of the importance of smoking cessation.   HTN (hypertension) blood pressure control important in reducing the progression of atherosclerotic disease. On appropriate oral medications.   Carotid stenosis Recently checked and was reported to me as approximately 70% right carotid stenosis and 50% left carotid stenosis.  Although these would be below the threshold for prophylactic repair, I do certainly a significant lesion in the 59 year old.  I would recommend a short interval follow-up of about 3 months.  I have added Plavix and Lipitor to her medical regimen today to try to maximize her medical therapy.  She is instructed to seek immediate medical attention with any focal neurologic symptoms.  Pain in limb The patient has lower extremity pain that is not entirely clear in its etiology, but in a patient with multiple atherosclerotic risk factors and atherosclerotic disease elsewhere who is actively smoking, I think assessment for PAD is certainly prudent.  We will obtain ABIs in the near future at her convenience.  We have discussed the pathophysiology and natural history of peripheral arterial disease today.  For potential peripheral arterial disease as well as recently diagnosed carotid disease, we have given her a prescription for Plavix and Lipitor today.      Festus Barren 12/05/2019, 2:30 PM   This note was created with Dragon medical transcription system.  Any errors from dictation are  unintentional.

## 2019-12-05 NOTE — Assessment & Plan Note (Signed)
We had a discussion for approximately 3-4 minutes regarding the absolute need for smoking cessation due to the deleterious nature of tobacco on the vascular system. We discussed the tobacco use would diminish patency of any intervention, and likely significantly worsen progressio of disease. We discussed multiple agents for quitting including replacement therapy or medications to reduce cravings such as Chantix. The patient voices their understanding of the importance of smoking cessation.  

## 2019-12-05 NOTE — Assessment & Plan Note (Signed)
blood pressure control important in reducing the progression of atherosclerotic disease. On appropriate oral medications.  

## 2019-12-05 NOTE — Patient Instructions (Signed)
Peripheral Vascular Disease  Peripheral vascular disease (PVD) is a disease of the blood vessels that are not part of your heart and brain. A simple term for PVD is poor circulation. In most cases, PVD narrows the blood vessels that carry blood from your heart to the rest of your body. This can reduce the supply of blood to your arms, legs, and internal organs, like your stomach or kidneys. However, PVD most often affects a person's lower legs and feet. Without treatment, PVD tends to get worse. PVD can also lead to acute ischemic limb. This is when an arm or leg suddenly cannot get enough blood. This is a medical emergency. Follow these instructions at home: Lifestyle  Do not use any products that contain nicotine or tobacco, such as cigarettes and e-cigarettes. If you need help quitting, ask your doctor.  Lose weight if you are overweight. Or, stay at a healthy weight as told by your doctor.  Eat a diet that is low in fat and cholesterol. If you need help, ask your doctor.  Exercise regularly. Ask your doctor for activities that are right for you. General instructions  Take over-the-counter and prescription medicines only as told by your doctor.  Take good care of your feet: ? Wear comfortable shoes that fit well. ? Check your feet often for any cuts or sores.  Keep all follow-up visits as told by your doctor This is important. Contact a doctor if:  You have cramps in your legs when you walk.  You have leg pain when you are at rest.  You have coldness in a leg or foot.  Your skin changes.  You are unable to get or have an erection (erectile dysfunction).  You have cuts or sores on your feet that do not heal. Get help right away if:  Your arm or leg turns cold, numb, and blue.  Your arms or legs become red, warm, swollen, painful, or numb.  You have chest pain.  You have trouble breathing.  You suddenly have weakness in your face, arm, or leg.  You become very  confused or you cannot speak.  You suddenly have a very bad headache.  You suddenly cannot see. Summary  Peripheral vascular disease (PVD) is a disease of the blood vessels.  A simple term for PVD is poor circulation. Without treatment, PVD tends to get worse.  Treatment may include exercise, low fat and low cholesterol diet, and quitting smoking. This information is not intended to replace advice given to you by your health care provider. Make sure you discuss any questions you have with your health care provider. Document Revised: 01/29/2017 Document Reviewed: 03/26/2016 Elsevier Patient Education  2020 Elsevier Inc.  

## 2019-12-05 NOTE — Assessment & Plan Note (Signed)
The patient has lower extremity pain that is not entirely clear in its etiology, but in a patient with multiple atherosclerotic risk factors and atherosclerotic disease elsewhere who is actively smoking, I think assessment for PAD is certainly prudent.  We will obtain ABIs in the near future at her convenience.  We have discussed the pathophysiology and natural history of peripheral arterial disease today.  For potential peripheral arterial disease as well as recently diagnosed carotid disease, we have given her a prescription for Plavix and Lipitor today.

## 2019-12-05 NOTE — Assessment & Plan Note (Signed)
Recently checked and was reported to me as approximately 70% right carotid stenosis and 50% left carotid stenosis.  Although these would be below the threshold for prophylactic repair, I do certainly a significant lesion in the 59 year old.  I would recommend a short interval follow-up of about 3 months.  I have added Plavix and Lipitor to her medical regimen today to try to maximize her medical therapy.  She is instructed to seek immediate medical attention with any focal neurologic symptoms.

## 2019-12-07 ENCOUNTER — Telehealth (INDEPENDENT_AMBULATORY_CARE_PROVIDER_SITE_OTHER): Payer: Self-pay

## 2019-12-07 NOTE — Telephone Encounter (Signed)
Morrie Sheldon from [facility] pharmacy called and wanted to know if we had prescribed the pt clopidogrel and atorvastatin. I called the pharmacy back and left a VM making them aware that we had prescribed the medication that me don't have the pt taking atorvastatin and the clopidogrel was last prescribed by Dr. Emerson Monte on 12/01/2019.

## 2019-12-20 ENCOUNTER — Other Ambulatory Visit: Payer: Self-pay

## 2019-12-20 ENCOUNTER — Encounter (INDEPENDENT_AMBULATORY_CARE_PROVIDER_SITE_OTHER): Payer: Self-pay | Admitting: Nurse Practitioner

## 2019-12-20 ENCOUNTER — Ambulatory Visit (INDEPENDENT_AMBULATORY_CARE_PROVIDER_SITE_OTHER): Payer: Medicaid Other | Admitting: Nurse Practitioner

## 2019-12-20 ENCOUNTER — Ambulatory Visit (INDEPENDENT_AMBULATORY_CARE_PROVIDER_SITE_OTHER): Payer: Medicaid Other

## 2019-12-20 VITALS — BP 120/70 | HR 76 | Ht 62.0 in | Wt 327.0 lb

## 2019-12-20 DIAGNOSIS — M79604 Pain in right leg: Secondary | ICD-10-CM | POA: Diagnosis not present

## 2019-12-20 DIAGNOSIS — I10 Essential (primary) hypertension: Secondary | ICD-10-CM | POA: Diagnosis not present

## 2019-12-20 DIAGNOSIS — F17200 Nicotine dependence, unspecified, uncomplicated: Secondary | ICD-10-CM | POA: Diagnosis not present

## 2019-12-20 DIAGNOSIS — I6523 Occlusion and stenosis of bilateral carotid arteries: Secondary | ICD-10-CM

## 2019-12-25 ENCOUNTER — Encounter (INDEPENDENT_AMBULATORY_CARE_PROVIDER_SITE_OTHER): Payer: Self-pay | Admitting: Nurse Practitioner

## 2019-12-25 NOTE — Progress Notes (Signed)
Subjective:    Patient ID: Kristie Dorsey, female    DOB: 09/21/1960, 59 y.o.   MRN: [id number] Chief Complaint  Patient presents with  . Follow-up    Pt.  con. ABI    Kristie Dorsey is a 59 y.o. female.  The patient returns today for follow-up evaluation of of lower extremity leg pain.  The patient notes that this leg pain happens with certain positions as well as with activity.  She describes the pain within the knee area.  Laying flat or sometimes laying on her right hip may precipitate some of her pain.  She also feels like she has some weakness on her right side at times.  Patient does have known arthritis.  She denies any true rest pain like symptoms.  She denies any fever, chills, nausea, vomiting or diarrhea.  She denies any lower extremity wounds.  Today noninvasive studies show an ABI of 1.15 bilaterally.  The patient has strong triphasic tibial artery waveforms with good toe waveforms bilaterally.   Review of Systems  Musculoskeletal: Positive for arthralgias.       Objective:   Physical Exam Vitals reviewed.  HENT:     Head: Normocephalic.  Cardiovascular:     Rate and Rhythm: Normal rate and regular rhythm.     Pulses: Normal pulses.  Pulmonary:     Effort: Pulmonary effort is normal.  Neurological:     Mental Status: She is alert and oriented to person, place, and time.  Psychiatric:        Mood and Affect: Mood normal.        Behavior: Behavior normal.        Thought Content: Thought content normal.        Judgment: Judgment normal.     BP 120/70   Pulse 76   Ht 5\' 2"  (1.575 m)   Wt (!) 327 lb (148.3 kg)   BMI 59.81 kg/m   Past Medical History:  Diagnosis Date  . CHF (congestive heart failure) (HCC)    probable, takes HCTZ as a "fluid pill"  . COPD (chronic obstructive pulmonary disease) (HCC)    probable, no specific diagnosis  . Depression    take fluoxetine  . Hypertension   . Morbid obesity (HCC)   . Thyroid disease    hypothryoidism     Social History   Socioeconomic History  . Marital status: Married    Spouse name: Not on file  . Number of children: Not on file  . Years of education: Not on file  . Highest education level: Not on file  Occupational History  . Not on file  Tobacco Use  . Smoking status: Current Some Day Smoker    Packs/day: 1.50    Years: 45.00    Pack years: 67.50    Types: Cigarettes    Start date: 03/02/1973    Last attempt to quit: 11/01/2018    Years since quitting: 1.1  . Smokeless tobacco: Never Used  . Tobacco comment: Vaping twice a day now 09/22/2019  Vaping Use  . Vaping Use: Every day  . Start date: 11/01/2018  . Substances: Nicotine  Substance and Sexual Activity  . Alcohol use: No  . Drug use: No  . Sexual activity: Not on file  Other Topics Concern  . Not on file  Social History Narrative  . Not on file   Social Determinants of Health   Financial Resource Strain:   . Difficulty of Paying Living Expenses:  Not on file  Food Insecurity:   . Worried About Programme researcher, broadcasting/film/video in the Last Year: Not on file  . Ran Out of Food in the Last Year: Not on file  Transportation Needs:   . Lack of Transportation (Medical): Not on file  . Lack of Transportation (Non-Medical): Not on file  Physical Activity:   . Days of Exercise per Week: Not on file  . Minutes of Exercise per Session: Not on file  Stress:   . Feeling of Stress : Not on file  Social Connections:   . Frequency of Communication with Friends and Family: Not on file  . Frequency of Social Gatherings with Friends and Family: Not on file  . Attends Religious Services: Not on file  . Active Member of Clubs or Organizations: Not on file  . Attends Banker Meetings: Not on file  . Marital Status: Not on file  Intimate Partner Violence:   . Fear of Current or Ex-Partner: Not on file  . Emotionally Abused: Not on file  . Physically Abused: Not on file  . Sexually Abused: Not on file    Past Surgical  History:  Procedure Laterality Date  . CHOLECYSTECTOMY    . LEFT HEART CATH AND CORONARY ANGIOGRAPHY N/A 04/14/2018   Procedure: LEFT HEART CATH AND CORONARY ANGIOGRAPHY;  Surgeon: Alwyn Pea, MD;  Location: [facility] INVASIVE CV LAB;  Service: Cardiovascular;  Laterality: N/A;  . TONSILLECTOMY    . TUBAL LIGATION      Family History  Adopted: Yes  Problem Relation Age of Onset  . Aneurysm Mother   . Cancer Father     No Known Allergies     Assessment & Plan:   1. Bilateral carotid artery stenosis As per previous recommendation from Dr. Wyn Quaker we will have the patient return in 3 months for follow-up studies.  Previous carotid was reported to be 70% right carotid stenosis was 50% left carotid stenosis.  Patient will continue with the Plavix and Lipitor as added at previous office visit.  2. Pain of right lower extremity Recommend:  I do not find evidence of Vascular pathology that would explain the patient's symptoms  The patient has atypical pain symptoms for vascular disease  I do not find evidence of Vascular pathology that would explain the patient's symptoms and I suspect the patient is c/o pseudoclaudication.  Patient should have an evaluation of his LS spine which I defer to the primary service.  Noninvasive studies of the legs do not identify vascular problems  The patient should continue walking and begin a more formal exercise program. The patient should continue his antiplatelet therapy and aggressive treatment of the lipid abnormalities. The patient should begin wearing graduated compression socks 15-20 mmHg strength to control her mild edema.  Patient will follow-up with me on a PRN basis  Further work-up of her lower extremity pain is deferred to the primary service     3. Primary hypertension Continue antihypertensive medications as already ordered, these medications have been reviewed and there are no changes at this time.   4. Tobacco use  disorder Smoking cessation was discussed, 3-10 minutes spent on this topic specifically    Current Outpatient Medications on File Prior to Visit  Medication Sig Dispense Refill  . albuterol (PROVENTIL HFA;VENTOLIN HFA) 108 (90 Base) MCG/ACT inhaler Inhale 2-4 puffs by mouth every 4 hours as needed for wheezing, cough, and/or shortness of breath (Patient taking differently: Inhale 2 puffs into the lungs  every 4 (four) hours as needed for wheezing or shortness of breath. Inhale 2-4 puffs by mouth every 4 hours as needed for wheezing, cough, and/or shortness of breath) 1 Inhaler 1  . buPROPion (WELLBUTRIN XL) 150 MG 24 hr tablet Take 150 mg by mouth daily.    . clopidogrel (PLAVIX) 75 MG tablet Take 75 mg by mouth daily.    Marland Kitchen FLUoxetine (PROZAC) 40 MG capsule Take 40 mg by mouth daily.    . furosemide (LASIX) 20 MG tablet Take 20 mg by mouth daily.    Marland Kitchen levothyroxine (SYNTHROID, LEVOTHROID) 75 MCG tablet Take 75 mcg by mouth daily before breakfast.     . mometasone-formoterol (DULERA) 200-5 MCG/ACT AERO Inhale 2 puffs into the lungs 2 (two) times daily. 13 g 6  . spironolactone (ALDACTONE) 25 MG tablet Take 25 mg by mouth daily.    Marland Kitchen tiotropium (SPIRIVA) 18 MCG inhalation capsule Place 1 capsule (18 mcg total) into inhaler and inhale daily. 30 capsule 6  . traZODone (DESYREL) 100 MG tablet Take 100 mg by mouth at bedtime.    . metoprolol succinate (TOPROL-XL) 25 MG 24 hr tablet Take 25 mg by mouth daily.     No current facility-administered medications on file prior to visit.    There are no Patient Instructions on file for this visit. No follow-ups on file.   Georgiana Spinner, NP

## 2020-01-11 ENCOUNTER — Encounter: Payer: Self-pay | Admitting: Pulmonary Disease

## 2020-01-11 ENCOUNTER — Ambulatory Visit (INDEPENDENT_AMBULATORY_CARE_PROVIDER_SITE_OTHER): Payer: Medicaid Other | Admitting: Pulmonary Disease

## 2020-01-11 ENCOUNTER — Other Ambulatory Visit: Payer: Self-pay

## 2020-01-11 VITALS — BP 136/68 | HR 66 | Temp 97.1°F | Ht 62.0 in | Wt 330.4 lb

## 2020-01-11 DIAGNOSIS — G4733 Obstructive sleep apnea (adult) (pediatric): Secondary | ICD-10-CM | POA: Diagnosis not present

## 2020-01-11 DIAGNOSIS — Z23 Encounter for immunization: Secondary | ICD-10-CM

## 2020-01-11 DIAGNOSIS — F17200 Nicotine dependence, unspecified, uncomplicated: Secondary | ICD-10-CM

## 2020-01-11 DIAGNOSIS — I5042 Chronic combined systolic (congestive) and diastolic (congestive) heart failure: Secondary | ICD-10-CM

## 2020-01-11 DIAGNOSIS — Z01811 Encounter for preprocedural respiratory examination: Secondary | ICD-10-CM

## 2020-01-11 DIAGNOSIS — R0602 Shortness of breath: Secondary | ICD-10-CM

## 2020-01-11 DIAGNOSIS — J984 Other disorders of lung: Secondary | ICD-10-CM

## 2020-01-11 NOTE — Patient Instructions (Signed)
Weight loss would help you greatly.  Commend a program such as weight watchers.  See you in follow-up in 3 months time call sooner should any new problems arise.

## 2020-01-11 NOTE — Progress Notes (Signed)
Subjective:    Patient ID: Kristie Dorsey, female    DOB: April 14, 1960, 59 y.o.   MRN: [id number]  HPI Patient is a 59 year old morbidly obese woman, former smoker (quit 11/20/2018, 67.5 pack years) continues to vape, who presents for follow-up on shortness of breath.  She has been initially evaluated by Dr. Wynona Neat on 21 Jul 2019 sequently followed by our nurse practitioners.  This is first visit with me at the clinic.  She has been diagnosed with obstructive sleep apnea by a sleep study performed 09 October 2019 and is on BiPAP.  She notes great improvement in her symptoms of dyspnea with BiPAP.  Testing performed to August 2021 which showed no obstructive defect and mostly restrictive defect with an ERV of 9% suggesting obesity.  She had a CT scan on 13 October 2019 - for PE showed cardiomegaly with interstitial pulmonary edema and old granulomatous disease.  No evidence of fibrosis or ILD.  She had a nuclear stress test that showed to be a low risk study however her ejection fraction is around 30%.  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  The patient is to undergo gynecological procedure under general anesthesia and was also requested to a preoperative evaluation.  She has had no fevers, chills or sweats.  No cough or sputum production.  No hemoptysis.  No chest pain, paroxysmal nocturnal dyspnea or lower extremity edema over usual.   Review of Systems A 10 point review of systems was performed and it is as noted above otherwise negative.    Objective:   Physical Exam BP 136/68 (BP Location: Left Wrist, Patient Position: Sitting, Cuff Size: Normal)   Pulse 66   Temp (!) 97.1 F (36.2 C) (Temporal)   Ht 5\' 2"  (1.575 m)   Wt (!) 330 lb 6.4 oz (149.9 kg)   SpO2 96%   BMI 60.43 kg/m  GENERAL: Morbidly obese woman, no acute distress.  No conversational dyspnea.  Presents in transport chair. HEAD: Normocephalic, atraumatic.  EYES:  Pupils equal, round, reactive to light.  No scleral icterus.  MOUTH: Nose/mouth/throat not examined due to masking requirements for COVID 19. NECK: Supple. No thyromegaly. Trachea midline. No JVD.  No adenopathy. PULMONARY: Good air entry bilaterally.  No adventitious sounds whatsoever. CARDIOVASCULAR: S1 and S2. Regular rate and rhythm.  Soft 1/6 systolic murmur left sternal border ABDOMEN: Obese, otherwise benign MUSCULOSKELETAL: No joint deformity, no clubbing, there is 1+ to 2+ pitting edema lower extremities (chronic). NEUROLOGIC: No overt focal deficit, speech is fluent. SKIN: Intact,warm,dry.  On limited exam no rashes PSYCH: Mood and behavior normal.       Assessment & Plan:     ICD-10-CM   1. Shortness of breath  R06.02    Multifactorial main component due to extreme obesity Cardiomyopathy adds to the issue COPD not demonstrated by PFTs  2. Restrictive lung disease  J98.4    Due to extreme obesity Weight loss is indicated Recommended a program such as weight watchers  3. Obstructive sleep apnea treated with bilevel positive airway pressure (BiPAP)  G47.33    This issue adds complexity to her management Continue BiPAP with oxygen supplementation Patient has been compliant  4. Chronic combined systolic (congestive) and diastolic (congestive) heart failure (HCC)  I50.42    LVEF 30% Adds to the shortness of breath symptom Recommend cardiac clearance prior to procedure  5. Need for immunization against influenza  Z23 Flu Vaccine QUAD 36+ mos  IM   Patient received influenza vaccine today  6. 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 Preop respiratory exam  Z01.811    From respiratory standpoint moderate risk Risk is mostly due to extreme obesity/OSA issues Additional issues due to cardiomyopathy    Discussion:  Since issues with dyspnea mostly related to extreme obesity with obesity  hypoventilation likely.  She also has issues with obstructive sleep apnea, she has been compliant with BiPAP and notes marked improvement in her symptoms on this.  She is motivated to lose weight.  Recommended a program such as weight watchers.  She declined evaluation by dietitian.  With regards to upcoming surgery her main risk factors are her extreme obesity and issues with obstructive sleep apnea as well as systolic LV dysfunction.  These risks cannot be further reduced.  She is as optimized as she is to be.  She is at least moderate risk from the standpoint.  We will see the patient in follow-up in 3 months time she is to contact us prior to that time should any new difficulties arise.  Gailen Shelter, MD [facility]   *This note was dictated using voice recognition software/Dragon.  Despite best efforts to proofread, errors can occur which can change the meaning.  Any change was purely unintentional.

## 2020-01-19 ENCOUNTER — Inpatient Hospital Stay: Admission: RE | Admit: 2020-01-19 | Payer: Medicaid Other | Source: Ambulatory Visit

## 2020-01-19 ENCOUNTER — Encounter: Payer: Self-pay | Admitting: Pulmonary Disease

## 2020-01-23 ENCOUNTER — Telehealth: Payer: Self-pay

## 2020-01-23 DIAGNOSIS — Z87891 Personal history of nicotine dependence: Secondary | ICD-10-CM

## 2020-01-23 DIAGNOSIS — Z122 Encounter for screening for malignant neoplasm of respiratory organs: Secondary | ICD-10-CM

## 2020-01-23 NOTE — Telephone Encounter (Signed)
Contacted patient today for lung screening CT scan based on referral from Elisha Headland.  Patient agreeable to program and CT scheduled for Monday, Dec 20 at 10:00am.  I will send her a my chart message at her request with appt date and time, address to imaging center and phone number to [facility], lung navigator. Patient a former smoker who stopped smoking in Sept 2020.  She smoked 1 pack per day and started smoking at age 59.  She did share she may smoke 1 to 2 cigarettes here and there now.  She confirms she is able to lie flat for 20 minutes for CT.  She also confirms she has Medcaid.

## 2020-01-24 NOTE — Telephone Encounter (Signed)
Former smoker, quit 11/01/18, 46 pack year

## 2020-01-24 NOTE — Addendum Note (Signed)
Addended by: Jonne Ply on: 01/24/2020 10:31 AM   Modules accepted: Orders

## 2020-01-30 ENCOUNTER — Other Ambulatory Visit: Payer: Medicaid Other

## 2020-01-31 ENCOUNTER — Other Ambulatory Visit: Payer: Medicaid Other

## 2020-02-02 ENCOUNTER — Encounter: Payer: Self-pay | Admitting: Urgent Care

## 2020-02-02 ENCOUNTER — Other Ambulatory Visit: Payer: Self-pay | Admitting: Urgent Care

## 2020-02-02 ENCOUNTER — Ambulatory Visit
Admission: RE | Admit: 2020-02-02 | Payer: Medicaid Other | Source: Home / Self Care | Admitting: Obstetrics & Gynecology

## 2020-02-02 ENCOUNTER — Encounter: Admission: RE | Payer: Self-pay | Source: Home / Self Care

## 2020-02-02 SURGERY — DILATATION AND CURETTAGE /HYSTEROSCOPY
Anesthesia: Choice

## 2020-02-02 NOTE — Consult Note (Addendum)
[facility] Perioperative Services  Pre-Admission/Anesthesia Testing Clinical Consult  Date: 02/02/20  Patient Demographics:  Name: Kristie Dorsey DOB:   1960-03-11 MRN:   [id number]  Planned Surgical Procedure(s):   Procedure:  Dilation and curettage, hysteroscopy, intrauterine device insertion (Mirena) Surgeon: Dr. Ranae Plumber, MD  NOTE: Available PAT nursing documentation and vital signs have been reviewed. Clinical nursing staff has updated patient's PMH/PSHx, current medication list, and drug allergies/intolerances to ensure comprehensive history available to assist in medical decision making as it pertains to the aforementioned surgical procedure and anticipated anesthetic course.   Clinical Discussion:  Kristie Dorsey is a 59 y.o. female who is submitted for pre-surgical anesthesia review and clearance prior to her undergoing the above procedure. Patient is a Former Smoker (67.5 pack years; quit 11/2018). Pertinent PMH includes: HFrEF, aortic valve stenosis (s/p TAVR), carotid stenosis HTN, COPD (on supplemental oxygen), DOE, hypothyroidism, morbid obesity (BMI 60.3), depression.  Patient with a significant cardiovascular history . She underwent TTE in 03/2018 that revealed a mildly dilated left ventricular cavity.  Systolic function was moderately to severely reduced with an LVEF of 30-35%.  There was diffuse hypokinesis.  Study demonstrated mild aortic regurgitation and severe aortic stenosis; mean gradient 39 mmHg; peak gradient 59 mmHg. Subsequent diagnostic left heart catheterization performed on 04/14/2018 revealed normal left ventricular systolic function; LVEF 60%.  Again severe aortic valve stenosis noted; gradient across aortic valve was 40 mmHg. Patient was referred to the TAVR team at [facility] in 08/2018, and began preoperative planning for aortic valve replacement.  Patient was seen by Dr. Elyn Peers on 08/31/2018 to discuss undergoing treatment options.  SAVR vs. TAVR discussed.  At that time, patient with severe symptomatic aortic valve stenosis with progressive heart failure (NYHA class III).  Patient felt to be at too greater risk for SAVR due to her comorbidities, however felt to be a good candidate for TAVR.  Patient ultimately underwent TAVR on 11/01/2018 at [facility].  Postoperative echocardiogram performed on 11/02/2018 revealed normal left ventricular systolic function with an LVEF of >55%; there was no evidence of aortic valve regurgitation (mean gradient 37 mmHg). TEE performed on 11/11/2018 revealed no paravalvular regurgitation and a well-seated prosthetic aortic valve.  Patient was discharged home on POD #4 following an uncomplicated surgical course with instructions to follow-up with outpatient cardiology.  Since her procedure, patient has been followed by local cardiologist Juliann Pares, MD).  Myocardial perfusion imaging study performed on 11/08/2019 that revealed a moderately decreased left ventricular ejection fraction of 30-44%; nuclear stress EF 30%.  There were no T wave inversions noted during stress.  Last TTE was performed on 01/15/2020 revealing mild to moderate segmental left ventricular systolic dysfunction with estimated LVEF of 35-40% with no evidence of aortic valve stenosis.  Echocardiogram also revealed basal to apical anteroseptal and inferoseptal wall hypokinesis. Patient has had ultrasound imaging of her bilateral carotid arteries that demonstrated a >70% right ICA stenosis and a 50 to 69% left ICA stenosis; she has been seen in consult by vascular surgery for this; started on statin and clopidogrel.   Patient was last seen in the cardiology clinic on 01/23/2020; notes reviewed.  At the time of her clinic visit, patient was doing well overall.  She reported a recent episode of chest pain that occurred on 01/22/2020 that was described as being a "sharp and heavy sensation".  Episode was self-limiting lasting only a few minutes.  Since  her last visit with cardiology, patient denies any other episodes  of angina.  Patient continues to have dyspnea both at rest and with exertion due to a known COPD diagnosis.  Patient denied any recent AECOPD requiring visit to the ED or hospitalization.  She denied peripheral edema, orthopnea, PND, palpitations, and presyncope/syncope.  Patient on multi-agent GDMT for her HTN and HFrEF diagnoses.  Blood pressure well controlled at 126/80 on prescribed regimen consisting of carvedilol, furosemide, sacubitril-valsartan, and hydralazine.  Patient is on a statin for cardiovascular risk reduction.  Exam revealed chronically ill-appearing patient with no evidence of volume overload or apparent distress.  Patient continues on DAPT therapy (ASA + clopidogrel); compliant with no evidence of bleeding. Patient to continue all previously prescribed interventions.  2 L/day fluid restriction was discussed. Given the single episode of self-limiting chest pain, the decision was made to defer further cardiovascular testing at that time.  Discussed repeat cardiac catheterization if symptoms more frequent/worsened.  Patient to follow-up with outpatient cardiology in 3 months.  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  Patient was last seen in pulmonology clinic on 01/11/2020 by Dr. Sarina Ser; notes reviewed.  Notes indicate the patient had a PSG on 10/09/2019 that demonstrated OSAH.  Patient is on nocturnal PAP therapy at this point and notes great improvement in her symptoms.  Patient continues on supplemental oxygen at 2 to 3 L/[place] as needed.  Patient reported to be wearing oxygen more as of late with improvement of her reported shortness of breath.  Patient noted that her breathing was at baseline; no recent exacerbations.  PFTs performed in 10/20/2019 revealed mostly restrictive defect with an ERV of 9% suggesting an obesity  related etiology; no obstructive defect noted.  CT angiography of the chest performed on 10/13/2019 revealed interstitial pulmonary edema and old granulomatous disease; no pulmonary embolism.  Exam revealed good air movement bilaterally with no evidence of adventitious breath sounds.  Patient with chronic 1-2+ peripheral edema noted.  Shortness of breath felt to be related to obesity related hypoventilation.  Weight loss recommended.  Patient scheduled to follow-up with outpatient pulmonary medicine in 3 months or sooner if needed.  Patient scheduled to undergo an elective D&C with insertion of an IUD in the near future with Dr. Ranae Plumber.  Patient has a past medical history significant for cardiopulmonary disease, which unfortunately places her at high risk for this procedure.  Given patient's past history, presurgical clearance was sought by patient's regular attending specialty providers as follows:   Patient initially issued clearance for surgery by cardiology on 01/04/2020 with a low risk ratification.  In review of her most recent visit notes from cardiology dated 01/23/2020, I reached back out to the cardiology provider to ensure that patient was still stratified as a low risk for the planned procedure.  Patient's functional capacity, per the DASI, is < 4 METS due to her cardiopulmonary disease and obesity. Per cardiology, "patient with a single episode of chest pain reported at most recent visit, however it did not appear to be significant.  There is nothing further to optimize from a cardiovascular standpoint.  At this point, patient is cleared to proceed with planned surgical intervention with an INTERMEDIATE risk for complications". Again, this patient is on daily DAPT therapy. She has been instructed on recommendations for holding her clopidogrel for 7 days prior to her procedure with plans to resume 2 days postoperatively.    Patient was last seen by vascular on 12/20/2019.  Carotid dopplers  reviewed. Recommended continued clopidogrel, ASA, and statin. Per vascular, "this patient is optimized for surgery and may proceed with a LOW risk stratification".   Per pulmonary medicine patient shortness of breath related more to obesity related hypoventilation rather than COPD.  Per Dr. Jayme Cloud, "with regards to her upcoming surgery, her main risk factors are her extreme obesity and issues with obstructive sleep apnea as well as LV systolic dysfunction. These risk cannot be further reduced.  She is as optimized as she is to be.  She is at least MODERATE to HIGH risk for the planned procedure".   Patient reports previous perioperative complications with anesthesia. Call placed to patient and this was discussed at length. Patient reported (+) prolonged emergence from general anesthesia (ASA IV). Patient states, "I was just really drowsy and out of it for a couple of days". Patient denies any anaphylactic type reactions; no chest pain, increased SOB, or hypotension experienced. Patient did not experience any PONV. Anesthesia notes from procedure reviewed; Mallampati IV, TM distance >3 FB, normal mouth opening, and normal cervical ROM.  Vitals with BMI 01/11/2020 12/20/2019 12/05/2019  Height 5\' 2"  5\' 2"  5\' 2"   Weight 330 lbs 6 oz 327 lbs 333 lbs 3 oz  BMI 60.42 59.79 60.93  Systolic 136 120  Diastolic 68 70 80  Pulse 66 76 77    Providers/Specialists:   NOTE: Primary physician provider listed below. Patient may have been seen by APP or partner within same practice.   PROVIDER ROLE LAST OV  Ward, , MD OB/GYN (Surgeon)  12/01/2019  413, MD Primary Care Provider  05/10/2019  01/31/2020, MD Cardiology  01/23/2020  07/10/2019, MD Vascular Surgery  12/20/2019  01/25/2020, MD Pulmonary Medicine  01/11/2020   Allergies:  Patient has no known allergies.  Current Home Medications:   . albuterol (PROVENTIL HFA;VENTOLIN HFA) 108 (90 Base) MCG/ACT inhaler   . atorvastatin (LIPITOR) 10 MG tablet  . buPROPion (WELLBUTRIN XL) 150 MG 24 hr tablet  . clopidogrel (PLAVIX) 75 MG tablet  . FLUoxetine (PROZAC) 40 MG capsule  . furosemide (LASIX) 40 MG tablet  . levothyroxine (SYNTHROID, LEVOTHROID) 75 MCG tablet  . metoprolol succinate (TOPROL-XL) 25 MG 24 hr tablet  . mometasone-formoterol (DULERA) 200-5 MCG/ACT AERO  . sacubitril-valsartan (ENTRESTO) 49-51 MG  . spironolactone (ALDACTONE) 25 MG tablet  . tiotropium (SPIRIVA) 18 MCG inhalation capsule  . traZODone (DESYREL) 100 MG tablet   No current facility-administered medications for this visit.   History:   Past Medical History:  Diagnosis Date  . Carotid stenosis   . CHF (congestive heart failure) (HCC)    probable, takes HCTZ as a "fluid pill"  . COPD (chronic obstructive pulmonary disease) (HCC)    probable, no specific diagnosis  . Depression    take fluoxetine  . Hypertension   . Morbid obesity (HCC)   . Nonrheumatic aortic (valve) stenosis    s/p TAVR  . Thyroid disease    hypothryoidism   Past Surgical History:  Procedure Laterality Date  . CHOLECYSTECTOMY    . LEFT HEART CATH AND CORONARY ANGIOGRAPHY N/A 04/14/2018   Procedure: LEFT HEART CATH AND CORONARY ANGIOGRAPHY;  Surgeon: Sarina Ser, MD;  Location: [facility] INVASIVE CV LAB;  Service: Cardiovascular;  Laterality: N/A;  . TONSILLECTOMY    . TUBAL LIGATION     Family History  Adopted: Yes  Problem Relation Age of Onset  . Aneurysm Mother   . Cancer Father  Social History   Tobacco Use  . Smoking status: Former Smoker    Packs/day: 1.50    Years: 45.00    Pack years: 67.50    Types: Cigarettes    Start date: 03/02/1973    Quit date: 11/01/2018    Years since quitting: 1.2  . Smokeless tobacco: Never Used  . Tobacco comment: Vaping/ 01/11/2020  Vaping Use  . Vaping Use: Every day  . Start date: 11/01/2018  . Substances: Nicotine  Substance Use Topics  . Alcohol use: No  . Drug use: No     Pertinent Clinical Results:  LABS: Labs reviewed: Acceptable for surgery. and Labs reviewed: Repeat labs will be required prior to surgery due to the length of time since lab testing was last performed. Will follow up on ordered labs as they result to ensure that patient is optimized for planned surgical course.         ECG: Date: 11/03/2018 Rate: 72 bpm Rhythm: normal sinus; LBBB Intervals: PR 160 ms. QRS 196 ms. QTc 521 ms. ST segment and T wave changes: Inferior T wave inversions have replaced nonspecific T wave abnormality Comparison: Similar to previous tracing obtained on 11/01/2018. NOTE: Tracing obtained at [facility]; unable for review. Above based on cardiologist's interpretation. WILL NEED REPEAT IN PAT.   IMAGING / PROCEDURES: ECHOCARDIOGRAM done on 01/15/2020 1. LVEF 35-40% 2. Mild to moderate segmental LV systolic dysfunction 3. Normal right ventricular systolic function 4. Moderate mitral valve regurgitation 5. Trivial tricuspid and pulmonary valve regurgitation; no aortic valve regurgitation 6. No valvular stenosis 7. Mild LV enlargement 8. Mild LA enlargement 9. Mild LVH 10. Basal to apical anteroseptal and inferoseptal wall hypokinesis  VASCULAR ULTRASOUND ABI W/WO TBI done on 12/20/2019 1. Right:   Resting right ankle-brachial index is within normal range.   No evidence of significant right lower extremity arterial disease.   The right toe-brachial index is normal.  2. Left:   Resting left ankle-brachial index is within normal range.  No evidence of significant left lower extremity arterial disease.   The left toe-brachial index is normal.   PULMONARY FUNCTION TESTING done on 10/02/2019   LEXISCAN done on 11/08/2019 1. Blood pressure demonstrated normal response to exercise 2. There was no ST segment deviation under stress 3. No T wave inversion noted during stress 4. This was an intermediate risk study 5. Left-ventricular ejection fraction was  mildly decreased at 30-44% 6. Nuclear stress EF 30%    CT angiography CHEST PE W/WO CONTRAST done on 10/13/2019 1. No evidence of a pulmonary embolism. 2. Mild cardiomegaly with bilateral interstitial thickening. 3. Interstitial pulmonary edema suspected.  4. No evidence of pneumonia.  5. No pleural effusion. 6. Mild aortic atherosclerosis.  7. Changes of healed granulomatous disease.  Impression and Plan:  Kristie Dorsey has been referred for pre-anesthesia review and clearance prior to her undergoing the planned anesthetic and procedural courses. Available labs, pertinent testing, and imaging results were personally reviewed by me. This patient has been appropriately cleared by cardiology, vascular surgery, and pulmonary medicine. Patient is as optimized as possible at this point, however it should be noted that patient is at an overall HIGH RISK for the planned procedure given her current clinical status. I have communicated with primary attending surgeon at this point to make her aware and to determine final disposition regarding whether or not we are planning to proceed. Risk versus benefits of planned procedure must be considered. If plans are to proceed, patient will need  to come through PAT for pre-operative testing. She will need updated labs and ECG prior to her procedure/anesthetic course. Based on clinical review performed today (02/02/20), barring any significant acute changes in the patient's overall condition, it is anticipated that she will be able to proceed with the planned surgical intervention, again with increased risk of peri/intra-operative complications.  Quentin MullingBryan Jaquanna Ballentine, MSN, APRN, FNP-C, [facility]  Peri[facility] Nurse Practitioner Phone: [phone] 02/02/20 5:07 PM  NOTE: This note has been prepared using Dragon dictation software. Despite my best ability to proofread, there is always the potential that unintentional transcriptional  errors may still occur from this process.

## 2020-02-07 ENCOUNTER — Telehealth: Payer: Self-pay | Admitting: Pulmonary Disease

## 2020-02-07 MED ORDER — SPIRIVA RESPIMAT 2.5 MCG/ACT IN AERS
2.0000 | INHALATION_SPRAY | Freq: Every day | RESPIRATORY_TRACT | 11 refills | Status: DC
Start: 2020-02-07 — End: 2021-03-04

## 2020-02-07 NOTE — Telephone Encounter (Signed)
Rx for spiriva 2.5 has been sent to preferred pharmacy.  Patient is aware and voiced her understanding.  Nothing further needed.

## 2020-02-07 NOTE — Telephone Encounter (Signed)
Okay to fill Spiriva Respimat 2.5 mcg, 2 puffs once a day, this replaces Spiriva HandiHaler.

## 2020-02-07 NOTE — Telephone Encounter (Signed)
Spoke to patient via telephone.  She is requesting refills on Spiriva 2.5.  Per Georgeanna Lea note, patient is on Spiriva HandiHaler and Dulera. Patient stated that she was switched to respimat, however I do not see record of that.   Dr. Jayme Cloud please advise if okay to refill spiriva 2.5? Thanks

## 2020-02-13 NOTE — Consult Note (Signed)
[facility] Perioperative Services  Pre-Admission/Anesthesia Testing Clinical Consult  Date: 02/02/20  Patient Demographics:  Name: Kristie Dorsey DOB:   08-02-1960 MRN:   [id number]  Planned Surgical Procedure(s):    Case: [id number] Date/Time: 02/28/20 1326   Procedure: DILATATION AND CURETTAGE /HYSTEROSCOPY (N/A )   Anesthesia type: Choice   Pre-op diagnosis: post menopausal bleeding   Location: [facility] OR ROOM 03 / [facility] ORS FOR ANESTHESIA GROUP   Surgeons: Leola BrazilWard, Chelsea C, MD    NOTES: Available PAT nursing documentation and vital signs have been reviewed. Clinical nursing staff has updated patient's PMH/PSHx, current medication list, and drug allergies/intolerances to ensure comprehensive history available to assist in medical decision making as it pertains to the aforementioned surgical procedure and anticipated anesthetic course.   Note entered under "orders only encounter on 02/02/2020, however it was not visible for other users/specialties to view. Note copied and pasted to current surgical encounter.   Clinical Discussion:  Kristie Dorsey is a 59 y.o. female who is submitted for pre-surgical anesthesia review and clearance prior to her undergoing the above procedure. Patient is a Former Smoker (67.5 pack years; quit 11/2018). Pertinent PMH includes: HFrEF, aortic valve stenosis (s/p TAVR), carotid stenosis HTN, COPD (on supplemental oxygen), DOE, hypothyroidism, morbid obesity (BMI 60.3), depression.  Patient with a significant cardiovascular history . She underwent TTE in 03/2018 that revealed a mildly dilated left ventricular cavity.  Systolic function was moderately to severely reduced with an LVEF of 30-35%.  There was diffuse hypokinesis.  Study demonstrated mild aortic regurgitation and severe aortic stenosis; mean gradient 39 mmHg; peak gradient 59 mmHg. Subsequent diagnostic left heart catheterization performed on 04/14/2018 revealed normal left ventricular  systolic function; LVEF 60%.  Again severe aortic valve stenosis noted; gradient across aortic valve was 40 mmHg. Patient was [facility] in [facility], and began preoperative planning for aortic valve replacement.  Patient was seen by Dr. Elyn PeersJohn Vavalle on 08/31/2018 to discuss undergoing treatment options. SAVR vs. TAVR discussed.  At that time, patient with severe symptomatic aortic valve stenosis with progressive heart failure (NYHA class III).  Patient felt to be at too greater risk for SAVR due to her comorbidities, however felt to be a good candidate for TAVR.  Patient [facility]  Postoperative echocardiogram performed on 11/02/2018 revealed normal left ventricular systolic function with an LVEF of >55%; there was no evidence of aortic valve regurgitation (mean gradient 37 mmHg). TEE performed on 11/11/2018 revealed no paravalvular regurgitation and a well-seated prosthetic aortic valve.  Patient was discharged home on POD #4 following an uncomplicated surgical course with instructions to follow-up with outpatient cardiology.  Since her procedure, patient has been followed by local cardiologist Juliann Pares([place], [place]).  Myocardial perfusion imaging study performed on 11/08/2019 that revealed a moderately decreased left ventricular ejection fraction of 30-44%; nuclear stress EF 30%.  There were no T wave inversions noted during stress.  Last TTE was performed on 01/15/2020 revealing mild to moderate segmental left ventricular systolic dysfunction with estimated LVEF of 35-40% with no evidence of aortic valve stenosis.  Echocardiogram also revealed basal to apical anteroseptal and inferoseptal wall hypokinesis. Patient has had ultrasound imaging of her bilateral carotid arteries that demonstrated a >70% right ICA stenosis and a 50 to 69% left ICA stenosis; she has been seen in consult by vascular surgery for this; started on statin and clopidogrel.   Patient was  last seen in the cardiology  clinic on 01/23/2020; notes reviewed.  At the time of her clinic visit, patient was doing well overall.  She reported a recent episode of chest pain that occurred on 01/22/2020 that was described as being a "sharp and heavy sensation".  Episode was self-limiting lasting only a few minutes.  Since her last visit with cardiology, patient denies any other episodes of angina.  Patient continues to have dyspnea both at rest and with exertion due to a known COPD diagnosis.  Patient denied any recent AECOPD requiring visit to the ED or hospitalization.  She denied peripheral edema, orthopnea, PND, palpitations, and presyncope/syncope.  Patient on multi-agent GDMT for her HTN and HFrEF diagnoses.  Blood pressure well controlled at 126/80 on prescribed regimen consisting of carvedilol, furosemide, sacubitril-valsartan, and hydralazine.  Patient is on a statin for cardiovascular risk reduction.  Exam revealed chronically ill-appearing patient with no evidence of volume overload or apparent distress.  Patient continues on DAPT therapy (ASA + clopidogrel); compliant with no evidence of bleeding. Patient to continue all previously prescribed interventions.  2 L/day fluid restriction was discussed. Given the single episode of self-limiting chest pain, the decision was made to defer further cardiovascular testing at that time.  Discussed repeat cardiac catheterization if symptoms more frequent/worsened.  Patient to follow-up with outpatient cardiology in 3 months.  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  Patient was last seen in pulmonology clinic on 01/11/2020 by Dr. Sarina Ser; notes reviewed.  Notes indicate the patient had a PSG on 10/09/2019 that demonstrated OSAH.  Patient is on nocturnal PAP therapy at this point and notes great improvement in her symptoms.  Patient continues on  supplemental oxygen at 2 to 3 L/[place] as needed.  Patient reported to be wearing oxygen more as of late with improvement of her reported shortness of breath.  Patient noted that her breathing was at baseline; no recent exacerbations.  PFTs performed in 10/20/2019 revealed mostly restrictive defect with an ERV of 9% suggesting an obesity related etiology; no obstructive defect noted.  CT angiography of the chest performed on 10/13/2019 revealed interstitial pulmonary edema and old granulomatous disease; no pulmonary embolism.  Exam revealed good air movement bilaterally with no evidence of adventitious breath sounds.  Patient with chronic 1-2+ peripheral edema noted.  Shortness of breath felt to be related to obesity related hypoventilation.  Weight loss recommended.  Patient scheduled to follow-up with outpatient pulmonary medicine in 3 months or sooner if needed.  Patient scheduled to undergo an elective D&C with insertion of an IUD in the near future with Dr. Ranae Plumber.  Patient has a past medical history significant for cardiopulmonary disease, which unfortunately places her at high risk for this procedure.  Given patient's past history, presurgical clearance was sought by the PAT team from the patient's regular attending specialty providers as follows:   Patient initially issued clearance for surgery by cardiology on 01/04/2020 with a low risk ratification.  In review of her most recent visit notes from cardiology dated 01/23/2020, I reached back out to the cardiology provider to ensure that patient was still stratified as a low risk for the planned procedure.  Patient's functional capacity, per the DASI, is < 4 METS due to her cardiopulmonary disease and obesity. Per cardiology, "patient with a single episode of chest pain reported at most recent visit, however it did not appear to be significant.  There is nothing further to  optimize from a cardiovascular standpoint.  At this point, patient is cleared to  proceed with planned surgical intervention with an INTERMEDIATE risk for complications". Again, this patient is on daily DAPT therapy. She has been instructed on recommendations for holding her clopidogrel for 7 days prior to her procedure with plans to resume 2 days postoperatively.    Patient was last seen by vascular on 12/20/2019. Carotid dopplers reviewed. Recommended continued clopidogrel, ASA, and statin. Per vascular, "this patient is optimized for surgery and may proceed with a LOW risk stratification".   Per pulmonary medicine patient shortness of breath related more to obesity related hypoventilation rather than COPD.  Per Dr. Jayme Cloud, "with regards to her upcoming surgery, her main risk factors are her extreme obesity and issues with obstructive sleep apnea as well as LV systolic dysfunction. These risk cannot be further reduced.  She is as optimized as she is to be.  She is at least MODERATE to HIGH risk for the planned procedure".   Patient reports previous perioperative complications with anesthesia. Call placed to patient and this was discussed at length. Patient reported (+) prolonged emergence from general anesthesia (ASA IV). Patient states, "I was just really drowsy and out of it for a couple of days". Patient denies any anaphylactic type reactions; no chest pain, increased SOB, or hypotension experienced. Patient did not experience any PONV. Anesthesia notes from procedure reviewed; Mallampati IV, TM distance >3 FB, normal mouth opening, and normal cervical ROM.  Vitals with BMI 01/11/2020 12/20/2019 12/05/2019  Height 5\' 2"  5\' 2"  5\' 2"   Weight 330 lbs 6 oz 327 lbs 333 lbs 3 oz  BMI 60.42 59.79 60.93  Systolic 136 120  Diastolic 68 70 80  Pulse 66 76 77    Providers/Specialists:   NOTE: Primary physician provider listed below. Patient may have been seen by APP or partner within same practice.   PROVIDER ROLE LAST OV  Ward, , MD OB/GYN (Surgeon)  12/01/2019   245, MD Primary Care Provider  05/10/2019  01/31/2020, MD Cardiology  01/23/2020  07/10/2019, MD Vascular Surgery  12/20/2019  01/25/2020, MD Pulmonary Medicine  01/11/2020   Allergies:  Patient has no known allergies.  Current Home Medications:   . albuterol (PROVENTIL HFA;VENTOLIN HFA) 108 (90 Base) MCG/ACT inhaler  . atorvastatin (LIPITOR) 10 MG tablet  . buPROPion (WELLBUTRIN XL) 150 MG 24 hr tablet  . clopidogrel (PLAVIX) 75 MG tablet  . FLUoxetine (PROZAC) 40 MG capsule  . furosemide (LASIX) 40 MG tablet  . levothyroxine (SYNTHROID, LEVOTHROID) 75 MCG tablet  . metoprolol succinate (TOPROL-XL) 25 MG 24 hr tablet  . mometasone-formoterol (DULERA) 200-5 MCG/ACT AERO  . sacubitril-valsartan (ENTRESTO) 49-51 MG  . spironolactone (ALDACTONE) 25 MG tablet  . tiotropium (SPIRIVA) 18 MCG inhalation capsule  . traZODone (DESYREL) 100 MG tablet   No current facility-administered medications for this visit.   History:   Past Medical History:  Diagnosis Date  . Carotid stenosis   . CHF (congestive heart failure) (HCC)    probable, takes HCTZ as a "fluid pill"  . COPD (chronic obstructive pulmonary disease) (HCC)    probable, no specific diagnosis  . Depression    take fluoxetine  . Hypertension   . Morbid obesity (HCC)   . Nonrheumatic aortic (valve) stenosis    s/p TAVR  . Thyroid disease    hypothryoidism   Past Surgical History:  Procedure Laterality Date  . CHOLECYSTECTOMY    . LEFT HEART  CATH AND CORONARY ANGIOGRAPHY N/A 04/14/2018   Procedure: LEFT HEART CATH AND CORONARY ANGIOGRAPHY;  Surgeon: Alwyn Pea, MD;  Location: [facility] INVASIVE CV LAB;  Service: Cardiovascular;  Laterality: N/A;  . TONSILLECTOMY    . TUBAL LIGATION     Family History  Adopted: Yes  Problem Relation Age of Onset  . Aneurysm Mother   . Cancer Father    Social History   Tobacco Use  . Smoking status: Former Smoker    Packs/day: 1.50    Years:  45.00    Pack years: 67.50    Types: Cigarettes    Start date: 03/02/1973    Quit date: 11/01/2018    Years since quitting: 1.2  . Smokeless tobacco: Never Used  . Tobacco comment: Vaping/ 01/11/2020  Vaping Use  . Vaping Use: Every day  . Start date: 11/01/2018  . Substances: Nicotine  Substance Use Topics  . Alcohol use: No  . Drug use: No    Pertinent Clinical Results:  LABS: Labs reviewed: Acceptable for surgery. and Labs reviewed: Repeat labs will be required prior to surgery due to the length of time since lab testing was last performed. Will follow up on ordered labs as they result to ensure that patient is optimized for planned surgical course.         ECG: Date: 11/03/2018 Rate: 72 bpm Rhythm: normal sinus; LBBB Intervals: PR 160 ms. QRS 196 ms. QTc 521 ms. ST segment and T wave changes: Inferior T wave inversions have replaced nonspecific T wave abnormality Comparison: Similar to previous tracing obtained on 11/01/2018. NOTE: Tracing obtained at [facility]; unable for review. Above based on cardiologist's interpretation. WILL NEED REPEAT IN PAT.   IMAGING / PROCEDURES: ECHOCARDIOGRAM done on 01/15/2020 1. LVEF 35-40% 2. Mild to moderate segmental LV systolic dysfunction 3. Normal right ventricular systolic function 4. Moderate mitral valve regurgitation 5. Trivial tricuspid and pulmonary valve regurgitation; no aortic valve regurgitation 6. No valvular stenosis 7. Mild LV enlargement 8. Mild LA enlargement 9. Mild LVH 10. Basal to apical anteroseptal and inferoseptal wall hypokinesis  VASCULAR ULTRASOUND ABI W/WO TBI done on 12/20/2019 1. Right:   Resting right ankle-brachial index is within normal range.   No evidence of significant right lower extremity arterial disease.   The right toe-brachial index is normal.  2. Left:   Resting left ankle-brachial index is within normal range.  No evidence of significant left lower extremity arterial disease.   The left  toe-brachial index is normal.   PULMONARY FUNCTION TESTING done on 10/02/2019   LEXISCAN done on 11/08/2019 1. Blood pressure demonstrated normal response to exercise 2. There was no ST segment deviation under stress 3. No T wave inversion noted during stress 4. This was an intermediate risk study 5. Left-ventricular ejection fraction was mildly decreased at 30-44% 6. Nuclear stress EF 30%    CT angiography CHEST PE W/WO CONTRAST done on 10/13/2019 1. No evidence of a pulmonary embolism. 2. Mild cardiomegaly with bilateral interstitial thickening. 3. Interstitial pulmonary edema suspected.  4. No evidence of pneumonia.  5. No pleural effusion. 6. Mild aortic atherosclerosis.  7. Changes of healed granulomatous disease.  Impression and Plan:  Kristie Dorsey has been referred for pre-anesthesia review and clearance prior to her undergoing the planned anesthetic and procedural courses. Available labs, pertinent testing, and imaging results were personally reviewed by me. This patient has been appropriately cleared by cardiology, vascular surgery, and pulmonary medicine. Patient is as optimized as possible at  this point, however it should be noted that patient is at an overall HIGH RISK for the planned procedure given her current clinical status. I have communicated with primary attending surgeon at this point to make her aware and to determine final disposition regarding whether or not we are planning to proceed. Risk versus benefits of planned procedure must be considered. If plans are to proceed, patient will need to come through PAT for pre-operative testing. She will need updated labs and ECG prior to her procedure/anesthetic course. Based on clinical review performed today (02/02/20), barring any significant acute changes in the patient's overall condition, it is anticipated that she will be able to proceed with the planned surgical intervention, again with increased risk of  peri/intra-operative complications.  Quentin Mulling, MSN, APRN, FNP-C, CEN [facility]  Peri-operative Services Nurse Practitioner Phone: [phone] 02/02/20 5:07 PM  NOTE: This note has been prepared using Dragon dictation software. Despite my best ability to proofread, there is always the potential that unintentional transcriptional errors may still occur from this process.

## 2020-02-19 ENCOUNTER — Inpatient Hospital Stay: Payer: Medicaid Other | Attending: Oncology | Admitting: Oncology

## 2020-02-19 ENCOUNTER — Ambulatory Visit: Admission: RE | Admit: 2020-02-19 | Payer: Medicaid Other | Source: Ambulatory Visit

## 2020-02-19 ENCOUNTER — Other Ambulatory Visit: Payer: Self-pay

## 2020-02-19 ENCOUNTER — Encounter: Payer: Self-pay | Admitting: Oncology

## 2020-02-19 DIAGNOSIS — Z87891 Personal history of nicotine dependence: Secondary | ICD-10-CM | POA: Diagnosis not present

## 2020-02-19 NOTE — Progress Notes (Signed)
Virtual Visit via Video Note  I connected with Kristie Dorsey on 02/19/20 at 10:00 AM EST by a video enabled telemedicine application and verified that I am speaking with the correct person using two identifiers.  Location: Patient: Home Provider: Clinic   I discussed the limitations of evaluation and management by telemedicine and the availability of in person appointments. The patient expressed understanding and agreed to proceed.  I discussed the assessment and treatment plan with the patient. The patient was provided an opportunity to ask questions and all were answered. The patient agreed with the plan and demonstrated an understanding of the instructions.   The patient was advised to call back or seek an in-person evaluation if the symptoms worsen or if the condition fails to improve as anticipated.   In accordance with CMS guidelines, patient has met eligibility criteria including age, absence of signs or symptoms of lung cancer.  Social History   Tobacco Use  . Smoking status: Former Smoker    Packs/day: 1.00    Years: 46.00    Pack years: 46.00    Types: Cigarettes    Start date: 03/02/1973    Quit date: 11/01/2018    Years since quitting: 1.3  . Smokeless tobacco: Never Used  . Tobacco comment: Vaping/ 01/11/2020  Vaping Use  . Vaping Use: Every day  . Start date: 11/01/2018  . Substances: Nicotine  Substance Use Topics  . Alcohol use: No  . Drug use: No      A shared decision-making session was conducted prior to the performance of CT scan. This includes one or more decision aids, includes benefits and harms of screening, follow-up diagnostic testing, over-diagnosis, false positive rate, and total radiation exposure.   Counseling on the importance of adherence to annual lung cancer LDCT screening, impact of co-morbidities, and ability or willingness to undergo diagnosis and treatment is imperative for compliance of the program.   Counseling on the importance of continued  smoking cessation for former smokers; the importance of smoking cessation for current smokers, and information about tobacco cessation interventions have been given to patient including [place] and [employer] quit [place] programs.   Written order for lung cancer screening with LDCT has been given to the patient and any and all questions have been answered to the best of my abilities.    Yearly follow up will be coordinated by Burgess Estelle, Thoracic Navigator.  I provided 15 minutes of face-to-face video visit time during this encounter, and > 50% was spent counseling as documented under my assessment & plan.   Jacquelin Hawking, NP

## 2020-02-21 ENCOUNTER — Other Ambulatory Visit
Admission: RE | Admit: 2020-02-21 | Discharge: 2020-02-21 | Disposition: A | Discharge status: Home / Self Care | Payer: Medicaid Other | Source: Ambulatory Visit | Attending: Obstetrics & Gynecology | Admitting: Obstetrics & Gynecology

## 2020-02-21 HISTORY — DX: Unspecified osteoarthritis, unspecified site: M19.90

## 2020-02-21 HISTORY — DX: Hypothyroidism, unspecified: E03.9

## 2020-02-21 HISTORY — DX: Angina pectoris, unspecified: I20.9

## 2020-02-21 HISTORY — DX: Dyspnea, unspecified: R06.00

## 2020-02-21 NOTE — Patient Instructions (Addendum)
Your procedure is scheduled on: 02/28/20- Wednesday Report to the Registration Desk on the 1st floor of the [facility]. To find out your arrival time, please call [phone] between 1PM - 3PM on: 02/27/20- Tuesday  REMEMBER: Instructions that are not followed completely may result in serious medical risk, up to and including death; or upon the discretion of your surgeon and anesthesiologist your surgery may need to be rescheduled.  Do not eat food after midnight the night before surgery.  No gum chewing, lozengers or hard candies.  You may however, drink CLEAR liquids up to 2 hours before you are scheduled to arrive for your surgery. Do not drink anything within 2 hours of your scheduled arrival time.  Clear liquids include: - water  - apple juice without pulp - gatorade (not RED, PURPLE, OR BLUE) - black coffee or tea (Do NOT add milk or creamers to the coffee or tea) Do NOT drink anything that is not on this list.  Type 1 and Type 2 diabetics should only drink water.   TAKE THESE MEDICATIONS THE MORNING OF SURGERY WITH A SIP OF WATER:  - atorvastatin (LIPITOR) 10 MG tablet  - buPROPion (WELLBUTRIN SR) 150 MG 12 hr tablet - FLUoxetine (PROZAC) 40 MG capsule - levothyroxine (SYNTHROID, LEVOTHROID) 75 MCG table - mometasone-formoterol (DULERA) 200-5 MCG/ACT AERO - Tiotropium Bromide Monohydrate (SPIRIVA RESPIMAT) 2.5 MCG/ACT   Use inhaler albuterol (PROVENTIL HFA;VENTOLIN HFA) 108 (90 Base) MCG/ACT inhaler on the day of surgery and bring to the hospital.  Follow recommendations from Cardiologist, Pulmonologist or PCP regarding stopping Aspirin, Coumadin, Plavix, Eliquis, Pradaxa, or Pletal. Stop Clopidogrel 02/20/20 , may resume 2 days post procedure 03/02/20.  One week prior to surgery: Stop Anti-inflammatories (NSAIDS) such as Advil, Aleve, Ibuprofen, Motrin, Naproxen, Naprosyn and Aspirin based products such as Excedrin, Goodys Powder, BC Powder.  Stop ANY OVER THE COUNTER  supplements until after surgery. (However, you may continue taking Vitamin D, Vitamin B, and multivitamin up until the day before surgery.)  No Alcohol for 24 hours before or after surgery.  No Smoking including e-cigarettes for 24 hours prior to surgery.  No chewable tobacco products for at least 6 hours prior to surgery.  No nicotine patches on the day of surgery.  Do not use any "recreational" drugs for at least a week prior to your surgery.  Please be advised that the combination of cocaine and anesthesia may have negative outcomes, up to and including death. If you test positive for cocaine, your surgery will be cancelled.  On the morning of surgery brush your teeth with toothpaste and water, you may rinse your mouth with mouthwash if you wish. Do not swallow any toothpaste or mouthwash.  Do not wear jewelry, make-up, hairpins, clips or nail polish.  Do not wear lotions, powders, or perfumes.   Do not shave body from the neck down 48 hours prior to surgery just in case you cut yourself which could leave a site for infection.  Also, freshly shaved skin may become irritated if using the CHG soap.  Contact lenses, hearing aids and dentures may not be worn into surgery.  Do not bring valuables to the hospital. [facility] is not responsible for any missing/lost belongings or valuables.   Bring your C-PAP to the hospital with you in case you may have to spend the night.   Notify your doctor if there is any change in your medical condition (cold, fever, infection).  Wear comfortable clothing (specific to your surgery  type) to the hospital.  Plan for stool softeners for home use; pain medications have a tendency to cause constipation. You can also help prevent constipation by eating foods high in fiber such as fruits and vegetables and drinking plenty of fluids as your diet allows.  After surgery, you can help prevent lung complications by doing breathing exercises.  Take deep  breaths and cough every 1-2 hours. Your doctor may order a device called an Incentive Spirometer to help you take deep breaths. When coughing or sneezing, hold a pillow firmly against your incision with both hands. This is called "splinting." Doing this helps protect your incision. It also decreases belly discomfort.  If you are being admitted to the hospital overnight, leave your suitcase in the car. After surgery it may be brought to your room.  If you are being discharged the day of surgery, you will not be allowed to drive home. You will need a responsible adult (18 years or older) to drive you home and stay with you that night.   If you are taking public transportation, you will need to have a responsible adult (18 years or older) with you. Please confirm with your physician that it is acceptable to use public transportation.   Please call the Pre-admissions Testing Dept. at [phone] if you have any questions about these instructions.  Visitation Policy:  Patients undergoing a surgery or procedure may have one family member or support person with them as long as that person is not COVID-19 positive or experiencing its symptoms.  That person may remain in the waiting area during the procedure.  Inpatient Visitation Update:   In an effort to ensure the safety of our team members and our patients, we are implementing a change to our visitation policy:  Effective Monday, Aug. 9, at 7 a.m., inpatients will be allowed one support person.  o The support person may change daily.  o The support person must pass our screening, gel in and out, and wear a mask at all times, including in the patient's room.  o Patients must also wear a mask when staff or their support person are in the room.  o Masking is required regardless of vaccination status.  Systemwide, no visitors 17 or younger.

## 2020-02-26 ENCOUNTER — Other Ambulatory Visit: Payer: Self-pay

## 2020-02-26 ENCOUNTER — Other Ambulatory Visit
Admission: RE | Admit: 2020-02-26 | Discharge: 2020-02-26 | Disposition: A | Discharge status: Home / Self Care | Payer: Medicaid Other | Source: Ambulatory Visit | Attending: Obstetrics & Gynecology | Admitting: Obstetrics & Gynecology

## 2020-02-26 DIAGNOSIS — I509 Heart failure, unspecified: Secondary | ICD-10-CM | POA: Diagnosis not present

## 2020-02-26 DIAGNOSIS — Z01818 Encounter for other preprocedural examination: Secondary | ICD-10-CM | POA: Insufficient documentation

## 2020-02-26 DIAGNOSIS — Z20822 Contact with and (suspected) exposure to covid-19: Secondary | ICD-10-CM | POA: Diagnosis not present

## 2020-02-26 DIAGNOSIS — Z01810 Encounter for preprocedural cardiovascular examination: Secondary | ICD-10-CM

## 2020-02-26 LAB — CBC
HCT: 38.5 % (ref 36.0–46.0)
Hemoglobin: 11.7 g/dL — ABNORMAL LOW (ref 12.0–15.0)
MCH: 27.3 pg (ref 26.0–34.0)
MCHC: 30.4 g/dL (ref 30.0–36.0)
MCV: 90 fL (ref 80.0–100.0)
Platelets: 195 10*3/uL (ref 150–400)
RBC: 4.28 MIL/uL (ref 3.87–5.11)
RDW: 15.1 % (ref 11.5–15.5)
WBC: 6.9 10*3/uL (ref 4.0–10.5)
nRBC: 0 % (ref 0.0–0.2)

## 2020-02-26 LAB — BASIC METABOLIC PANEL
Anion gap: 7 (ref 5–15)
BUN: 19 mg/dL (ref 6–20)
CO2: 30 mmol/L (ref 22–32)
Calcium: 8.9 mg/dL (ref 8.9–10.3)
Chloride: 98 mmol/L (ref 98–111)
Creatinine, Ser: 0.85 mg/dL (ref 0.44–1.00)
GFR, Estimated: >60 mL/min (ref >60)
Glucose, Bld: 107 mg/dL — ABNORMAL HIGH (ref 70–99)
Potassium: 4.1 mmol/L (ref 3.5–5.1)
Sodium: 135 mmol/L (ref 135–145)

## 2020-02-26 LAB — TYPE AND SCREEN
ABO/RH(D): O POS
Antibody Screen: NEGATIVE

## 2020-02-27 LAB — SARS CORONAVIRUS 2 (TAT 6-24 HRS): SARS Coronavirus 2: NEGATIVE

## 2020-02-28 ENCOUNTER — Ambulatory Visit: Payer: Medicaid Other | Admitting: Urgent Care

## 2020-02-28 ENCOUNTER — Ambulatory Visit
Admission: RE | Admit: 2020-02-28 | Discharge: 2020-02-28 | Disposition: A | Discharge status: Home / Self Care | Payer: Medicaid Other | Attending: Obstetrics & Gynecology | Admitting: Obstetrics & Gynecology

## 2020-02-28 ENCOUNTER — Other Ambulatory Visit: Payer: Self-pay

## 2020-02-28 ENCOUNTER — Encounter: Payer: Self-pay | Admitting: Obstetrics & Gynecology

## 2020-02-28 ENCOUNTER — Encounter
Admission: RE | Disposition: A | Discharge status: Home / Self Care | Payer: Self-pay | Source: Home / Self Care | Attending: Obstetrics & Gynecology

## 2020-02-28 DIAGNOSIS — Z79899 Other long term (current) drug therapy: Secondary | ICD-10-CM | POA: Diagnosis not present

## 2020-02-28 DIAGNOSIS — Z7902 Long term (current) use of antithrombotics/antiplatelets: Secondary | ICD-10-CM | POA: Diagnosis not present

## 2020-02-28 DIAGNOSIS — Z87891 Personal history of nicotine dependence: Secondary | ICD-10-CM | POA: Diagnosis not present

## 2020-02-28 DIAGNOSIS — Z79890 Hormone replacement therapy (postmenopausal): Secondary | ICD-10-CM | POA: Diagnosis not present

## 2020-02-28 DIAGNOSIS — Z7951 Long term (current) use of inhaled steroids: Secondary | ICD-10-CM | POA: Diagnosis not present

## 2020-02-28 DIAGNOSIS — N950 Postmenopausal bleeding: Secondary | ICD-10-CM | POA: Insufficient documentation

## 2020-02-28 HISTORY — PX: HYSTEROSCOPY WITH D & C: SHX1775

## 2020-02-28 LAB — CBC
HCT: 37.5 % (ref 36.0–46.0)
Hemoglobin: 12 g/dL (ref 12.0–15.0)
MCH: 28 pg (ref 26.0–34.0)
MCHC: 32 g/dL (ref 30.0–36.0)
MCV: 87.4 fL (ref 80.0–100.0)
Platelets: 189 10*3/uL (ref 150–400)
RBC: 4.29 MIL/uL (ref 3.87–5.11)
RDW: 14.9 % (ref 11.5–15.5)
WBC: 7.8 10*3/uL (ref 4.0–10.5)
nRBC: 0 % (ref 0.0–0.2)

## 2020-02-28 LAB — BASIC METABOLIC PANEL WITH GFR
Anion gap: 11 (ref 5–15)
BUN: 17 mg/dL (ref 6–20)
CO2: 26 mmol/L (ref 22–32)
Calcium: 9 mg/dL (ref 8.9–10.3)
Chloride: 99 mmol/L (ref 98–111)
Creatinine, Ser: 0.87 mg/dL (ref 0.44–1.00)
GFR, Estimated: >60 mL/min
Glucose, Bld: 99 mg/dL (ref 70–99)
Potassium: 4.2 mmol/L (ref 3.5–5.1)
Sodium: 136 mmol/L (ref 135–145)

## 2020-02-28 LAB — ABO/RH: ABO/RH(D): O POS

## 2020-02-28 SURGERY — DILATATION AND CURETTAGE /HYSTEROSCOPY
Anesthesia: General

## 2020-02-28 MED ORDER — PROPOFOL 10 MG/ML IV BOLUS
INTRAVENOUS | Status: AC
  Filled 2020-02-28: qty 20

## 2020-02-28 MED ORDER — LIDOCAINE HCL (CARDIAC) PF 100 MG/5ML IV SOSY
PREFILLED_SYRINGE | INTRAVENOUS | Status: DC | PRN
  Administered 2020-02-28: 100 mg via INTRAVENOUS

## 2020-02-28 MED ORDER — FENTANYL CITRATE (PF) 100 MCG/2ML IJ SOLN
INTRAMUSCULAR | Status: DC | PRN
  Administered 2020-02-28 (×2): 50 ug via INTRAVENOUS

## 2020-02-28 MED ORDER — FENTANYL CITRATE (PF) 100 MCG/2ML IJ SOLN
INTRAMUSCULAR | Status: AC
  Administered 2020-02-28: 25 ug via INTRAVENOUS
  Filled 2020-02-28: qty 2

## 2020-02-28 MED ORDER — FAMOTIDINE 20 MG PO TABS
ORAL_TABLET | ORAL | Status: AC
  Administered 2020-02-28: 20 mg via ORAL
  Filled 2020-02-28: qty 1

## 2020-02-28 MED ORDER — ACETAMINOPHEN 10 MG/ML IV SOLN
INTRAVENOUS | Status: DC | PRN
  Administered 2020-02-28: 1000 mg via INTRAVENOUS

## 2020-02-28 MED ORDER — PROPOFOL 10 MG/ML IV BOLUS
INTRAVENOUS | Status: DC | PRN
  Administered 2020-02-28: 30 mg via INTRAVENOUS
  Administered 2020-02-28: 150 mg via INTRAVENOUS

## 2020-02-28 MED ORDER — ORAL CARE MOUTH RINSE: 15.0000 mL | Freq: Once | OROMUCOSAL | Status: AC

## 2020-02-28 MED ORDER — ACETAMINOPHEN 10 MG/ML IV SOLN
INTRAVENOUS | Status: AC
  Filled 2020-02-28: qty 100

## 2020-02-28 MED ORDER — SUCCINYLCHOLINE CHLORIDE 20 MG/ML IJ SOLN
INTRAMUSCULAR | Status: DC | PRN
  Administered 2020-02-28: 130 mg via INTRAVENOUS

## 2020-02-28 MED ORDER — FENTANYL CITRATE (PF) 100 MCG/2ML IJ SOLN
25.0000 ug | INTRAMUSCULAR | Status: DC | PRN
Start: 2020-02-28 — End: 2020-02-28
  Administered 2020-02-28: 25 ug via INTRAVENOUS

## 2020-02-28 MED ORDER — FENTANYL CITRATE (PF) 100 MCG/2ML IJ SOLN: 25.0000 ug | INTRAMUSCULAR | Status: DC | PRN

## 2020-02-28 MED ORDER — FAMOTIDINE 20 MG PO TABS: 20.0000 mg | ORAL_TABLET | Freq: Once | ORAL | Status: AC

## 2020-02-28 MED ORDER — ONDANSETRON HCL 4 MG/2ML IJ SOLN
INTRAMUSCULAR | Status: DC | PRN
  Administered 2020-02-28: 4 mg via INTRAVENOUS

## 2020-02-28 MED ORDER — FENTANYL CITRATE (PF) 100 MCG/2ML IJ SOLN
INTRAMUSCULAR | Status: AC
  Filled 2020-02-28: qty 2

## 2020-02-28 MED ORDER — CHLORHEXIDINE GLUCONATE 0.12 % MT SOLN: 15.0000 mL | Freq: Once | OROMUCOSAL | Status: AC

## 2020-02-28 MED ORDER — ROCURONIUM BROMIDE 100 MG/10ML IV SOLN
INTRAVENOUS | Status: DC | PRN
  Administered 2020-02-28: 5 mg via INTRAVENOUS

## 2020-02-28 MED ORDER — LACTATED RINGERS IV SOLN: INTRAVENOUS | Status: DC

## 2020-02-28 MED ORDER — HYDROCORTISONE NA SUCCINATE PF 100 MG IJ SOLR
INTRAMUSCULAR | Status: DC | PRN
  Administered 2020-02-28: 100 mg via INTRAVENOUS

## 2020-02-28 MED ORDER — DEXAMETHASONE SODIUM PHOSPHATE 10 MG/ML IJ SOLN
INTRAMUSCULAR | Status: DC | PRN
  Administered 2020-02-28: 10 mg via INTRAVENOUS

## 2020-02-28 MED ORDER — ONDANSETRON HCL 4 MG/2ML IJ SOLN: 4.0000 mg | Freq: Once | INTRAMUSCULAR | Status: DC | PRN

## 2020-02-28 MED ORDER — CHLORHEXIDINE GLUCONATE 0.12 % MT SOLN
OROMUCOSAL | Status: AC
  Administered 2020-02-28: 15 mL via OROMUCOSAL
  Filled 2020-02-28: qty 15

## 2020-02-28 SURGICAL SUPPLY — 20 items
COVER WAND RF STERILE (DRAPES) ×2 IMPLANT
DEVICE MYOSURE LITE (MISCELLANEOUS) IMPLANT
DEVICE MYOSURE REACH (MISCELLANEOUS) IMPLANT
ELECT REM PT RETURN 9FT ADLT (ELECTROSURGICAL) ×2
ELECTRODE REM PT RTRN 9FT ADLT (ELECTROSURGICAL) ×1 IMPLANT
GLOVE PI ORTHOPRO 6.5 (GLOVE) ×1
GLOVE PI ORTHOPRO STRL 6.5 (GLOVE) ×1 IMPLANT
GLOVE SURG SYN 6.5 ES PF (GLOVE) ×4 IMPLANT
GLOVE SURG SYN 6.5 PF PI (GLOVE) ×2 IMPLANT
GOWN STRL REUS W/ TWL LRG LVL3 (GOWN DISPOSABLE) ×2 IMPLANT
GOWN STRL REUS W/TWL LRG LVL3 (GOWN DISPOSABLE) ×4
IV LACTATED RINGERS 1000ML (IV SOLUTION) ×2 IMPLANT
KIT PROCEDURE FLUENT (KITS) IMPLANT
KIT TURNOVER CYSTO (KITS) ×2 IMPLANT
MANIFOLD NEPTUNE II (INSTRUMENTS) ×2 IMPLANT
PACK DNC HYST (MISCELLANEOUS) ×2 IMPLANT
PAD OB MATERNITY 4.3X12.25 (PERSONAL CARE ITEMS) ×2 IMPLANT
PAD PREP 24X41 OB/GYN DISP (PERSONAL CARE ITEMS) ×2 IMPLANT
SEAL ROD LENS SCOPE MYOSURE (ABLATOR) ×2 IMPLANT
TUBING CONNECTING 10 (TUBING) ×2 IMPLANT

## 2020-02-28 NOTE — Transfer of Care (Signed)
Immediate Anesthesia Transfer of Care Note  Patient: Kristie Dorsey  Procedure(s) Performed: DILATATION AND CURETTAGE (N/A )  Patient Location: PACU  Anesthesia Type:General  Level of Consciousness: awake and drowsy  Airway & Oxygen Therapy: Patient Spontanous Breathing and Patient connected to face mask oxygen  Post-op Assessment: Report given to RN and Post -op Vital signs reviewed and stable  Post vital signs: Reviewed and stable  Last Vitals:  Vitals Value Taken Time  BP 171/73 02/28/20 1422  Temp    Pulse 73 02/28/20 1427  Resp 19 02/28/20 1427  SpO2 99 % 02/28/20 1427  Vitals shown include unvalidated device data.  Last Pain:  Vitals:   02/28/20 1251  TempSrc: Oral  PainSc: 0-No pain         Complications: No complications documented.

## 2020-02-28 NOTE — H&P (Addendum)
Day-of-Surgery Preoperative History and Physical   Kristie Dorsey is a 59 y.o.  here for surgical management of postmenopausal bleeding and thickened endometrium.   Preoperative concerns have been addressed.  She has been evaluated by anesthesia, vascular, pulmonary, and cardiology.  She remains at, and knows she is high risk for anesthesia complications, and elects to proceed.    She was unable to be evaluated completely in the office.  Ultrasound shows a 40mm endometrial echo  Proposed surgery: Exam under anesthesia, dilation and curettage with hysteroscopy  Past Medical History:  Diagnosis Date  . Anginal pain (HCC)   . Arthritis   . Carotid stenosis   . CHF (congestive heart failure) (HCC)    probable, takes HCTZ as a "fluid pill"  . COPD (chronic obstructive pulmonary disease) (HCC)    probable, no specific diagnosis  . Depression    take fluoxetine  . Dyspnea   . Hypertension   . Hypothyroidism   . Morbid obesity (HCC)   . Nonrheumatic aortic (valve) stenosis    s/p TAVR  . Thyroid disease    hypothryoidism   Past Surgical History:  Procedure Laterality Date  . CARDIAC VALVE REPLACEMENT    . CHOLECYSTECTOMY    . LEFT HEART CATH AND CORONARY ANGIOGRAPHY N/A 04/14/2018   Procedure: LEFT HEART CATH AND CORONARY ANGIOGRAPHY;  Surgeon: Alwyn Pea, MD;  Location: [facility] INVASIVE CV LAB;  Service: Cardiovascular;  Laterality: N/A;  . TONSILLECTOMY    . TUBAL LIGATION     OB History  No obstetric history on file.  Patient denies any other pertinent gynecologic issues.   No current facility-administered medications on file prior to encounter.   Current Outpatient Medications on File Prior to Encounter  Medication Sig Dispense Refill  . buPROPion (WELLBUTRIN SR) 150 MG 12 hr tablet Take 150 mg by mouth 2 (two) times daily.    Marland Kitchen FLUoxetine (PROZAC) 40 MG capsule Take 40 mg by mouth daily.    . furosemide (LASIX) 40 MG tablet Take 40 mg by mouth daily.     Marland Kitchen  levothyroxine (SYNTHROID, LEVOTHROID) 75 MCG tablet Take 75 mcg by mouth daily before breakfast.     . mometasone-formoterol (DULERA) 200-5 MCG/ACT AERO Inhale 2 puffs into the lungs 2 (two) times daily. 13 g 6  . sacubitril-valsartan (ENTRESTO) 49-51 MG Take 1 tablet by mouth 2 (two) times daily.    Marland Kitchen spironolactone (ALDACTONE) 25 MG tablet Take 25 mg by mouth daily.    . Tiotropium Bromide Monohydrate (SPIRIVA RESPIMAT) 2.5 MCG/ACT AERS Inhale 2 puffs into the lungs daily. 4 g 11  . traZODone (DESYREL) 100 MG tablet Take 100 mg by mouth at bedtime.    Marland Kitchen albuterol (PROVENTIL HFA;VENTOLIN HFA) 108 (90 Base) MCG/ACT inhaler Inhale 2-4 puffs by mouth every 4 hours as needed for wheezing, cough, and/or shortness of breath (Patient taking differently: Inhale 2 puffs into the lungs every 4 (four) hours as needed for wheezing or shortness of breath. Inhale 2-4 puffs by mouth every 4 hours as needed for wheezing, cough, and/or shortness of breath) 1 Inhaler 1  . atorvastatin (LIPITOR) 10 MG tablet Take 10 mg by mouth daily. (Patient not taking: Reported on 02/28/2020)    . clopidogrel (PLAVIX) 75 MG tablet Take 75 mg by mouth daily.    . Multiple Vitamins-Minerals (MULTIVITAMIN WITH MINERALS) tablet Take 1 tablet by mouth daily.     No Known Allergies  Social History:   reports that she quit smoking about  15 months ago. Her smoking use included cigarettes. She started smoking about 47 years ago. She has a 46.00 pack-year smoking history. She has never used smokeless tobacco. She reports that she does not drink alcohol and does not use drugs.  Family History  Adopted: Yes  Problem Relation Age of Onset  . Aneurysm Mother   . Cancer Father     Review of Systems: Noncontributory  PHYSICAL EXAM: Blood pressure (!) 161/54, pulse 72, temperature 98 F (36.7 C), temperature source Oral, resp. rate 18, height 5\' 2"  (1.575 m), weight (!) 149.2 kg, last menstrual period 02/21/2020, SpO2 93 %. General  appearance - alert, well appearing, and in no distress Chest - clear to auscultation, symmetric air entry, normal respiratory effort Heart - normal rate and regular rhythm Abdomen - soft, nontender, non-distended Pelvic - examination not performed in preop area Extremities - peripheral pulses normal, no pedal edema, no clubbing or cyanosis  Labs: Results for orders placed or performed during the hospital encounter of 02/26/20 (from the past 336 hour(s))  CBC   Collection Time: 02/26/20 10:13 AM  Result Value Ref Range   WBC 6.9 4.0 - 10.5 K/uL   RBC 4.28 3.87 - 5.11 MIL/uL   Hemoglobin 11.7 (L) 12.0 - 15.0 g/dL   HCT 02/28/20 16.1 - 09.6 %   MCV 90.0 80.0 - 100.0 fL   MCH 27.3 26.0 - 34.0 pg   MCHC 30.4 30.0 - 36.0 g/dL   RDW 04.5 40.9 - 81.1 %   Platelets 195 150 - 400 K/uL   nRBC 0.0 0.0 - 0.2 %  Basic metabolic panel   Collection Time: 02/26/20 10:13 AM  Result Value Ref Range   Sodium 135 135 - 145 mmol/L   Potassium 4.1 3.5 - 5.1 mmol/L   Chloride 98 98 - 111 mmol/L   CO2 30 22 - 32 mmol/L   Glucose, Bld 107 (H) 70 - 99 mg/dL   BUN 19 6 - 20 mg/dL   Creatinine, Ser 02/28/20 0.44 - 1.00 mg/dL   Calcium 8.9 8.9 - 7.82 mg/dL   GFR, Estimated 95.6 >21 mL/min   Anion gap 7 5 - 15  Type and screen [facility]   Collection Time: 02/26/20 10:13 AM  Result Value Ref Range   ABO/RH(D) O POS    Antibody Screen NEG    Sample Expiration 03/11/2020,2359    Extend sample reason      NO TRANSFUSIONS OR PREGNANCY IN THE PAST 3 MONTHS Performed at [facility], [address] [place], [place]   SARS CORONAVIRUS 2 (TAT 6-24 HRS) Nasopharyngeal Nasopharyngeal Swab   Collection Time: 02/26/20 12:37 PM   Specimen: Nasopharyngeal Swab  Result Value Ref Range   SARS Coronavirus 2 NEGATIVE NEGATIVE    Imaging Studies: No results found.  Assessment: Patient Active Problem List   Diagnosis Date Noted  . Carotid stenosis 12/05/2019  . Pain in limb  12/05/2019  . Electronic cigarette use 09/22/2019  . Dyspnea on exertion 09/22/2019  . Depression 04/10/2019  . Class 3 severe obesity with body mass index (BMI) of 60.0 to 69.9 in adult (HCC) 12/09/2018  . 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 Chronic systolic congestive heart failure (HCC) 04/27/2018  . Nonrheumatic aortic (valve) stenosis 04/27/2018  . HTN (hypertension) 03/11/2018  . Former smoker 03/11/2018  . Snoring 03/11/2018  . Lymphedema 03/11/2018  . Tobacco use disorder 03/11/2018    Plan: Patient will undergo surgical management  with dilation and curettage, hysteroscopy.   The risks of surgery were discussed in detail with the patient including but not limited to: bleeding which may require transfusion or reoperation; infection which may require antibiotics; injury to surrounding organs which may involve bowel, bladder, ureters ; need for additional procedures including laparoscopy or laparotomy; thromboembolic phenomenon, surgical site problems and other postoperative/anesthesia complications. Likelihood of success in alleviating the patient's condition was discussed. Routine postoperative instructions will be reviewed with the patient and her family in detail after surgery.  The patient concurred with the proposed plan, giving informed written consent for the surgery.  Patient has been NPO since last night she will remain NPO for procedure.  Anesthesia and OR aware.  Preoperative prophylactic antibiotics and SCDs ordered on call to the OR.  To OR when ready.  ----- Ranae Plumber, MD, FACOG Attending Obstetrician and Gynecologist [facility], Department of OB/GYN [facility]  02/28/2020 1:07 PM

## 2020-02-28 NOTE — Op Note (Signed)
Operative Report Dilation and Curettage 02/28/2020  Patient:  Kristie Dorsey  59 y.o. female Preoperative diagnosis:  post menopausal bleeding Postoperative diagnosis:  post menopausal bleeding  PROCEDURE:  Procedure(s): DILATATION AND CURETTAGE (N/A) Surgeon:  Surgeon(s) and Role:    * Vanice Rappa, Elenora Fender, MD - Primary    * Christeen Douglas, MD - Assisting Anesthesia:  GET I/O: Total I/O In: 500 [I.V.:500] Out: 5 [Blood:5] Specimens:  Endometrial curettings Complications: None Apparent Disposition:  VS stable to PACU  Findings: Uterus, mobile, normal size, sounding to 8cm; normal cervix, vagina, perineum.  Minimal tissue returned, mostly old blood clots  Indication for procedure/Consents: 59 y.o.   here for scheduled surgery for the aforementioned diagnoses.  Risks of surgery were discussed with the patient including but not limited to: bleeding which may require transfusion; infection which may require antibiotics; injury to uterus or surrounding organs; intrauterine scarring which may impair future fertility; need for additional procedures including laparotomy or laparoscopy; and other postoperative/anesthesia complications. Written informed consent was obtained.    Procedure Details:   The patient was then taken to the operating room where anesthesia was administered and was found to be adequate.  After a formal timeout was performed, she was placed in the dorsal lithotomy position and examined with the above findings. She was then prepped and draped in the sterile manner.  A speculum was then placed in the patient's vagina and a single tooth tenaculum was applied to the anterior lip of the cervix.   The speculum had to be removed in order to access the cervix due to redundant internal and external tissue. The remainder of the procedure was performed by feel.  The uterus was sounded to 8cm. Her cervix was serially dilated to accommodate the curettage. A sharp curettage was then  performed until there was a gritty texture in all four quadrants. The specimen was handed off to nursing.  The tenaculum was removed from the anterior lip of the cervix. The patient tolerated the procedure well and was taken to the recovery area awake, extubated and in stable condition.  The patient will be discharged to home as per PACU criteria.  Routine postoperative instructions given. She will follow up in the clinic in two to four weeks for postoperative evaluation.  Ranae Plumber, MD [facility] Attending Gynecologist

## 2020-02-28 NOTE — Anesthesia Postprocedure Evaluation (Signed)
Anesthesia Post Note  Patient: Kristie Dorsey  Procedure(s) Performed: DILATATION AND CURETTAGE (N/A )  Patient location during evaluation: PACU Anesthesia Type: General Level of consciousness: awake and alert and oriented Pain management: pain level controlled Vital Signs Assessment: post-procedure vital signs reviewed and stable Respiratory status: spontaneous breathing Cardiovascular status: blood pressure returned to baseline Anesthetic complications: no   No complications documented.   Last Vitals:  Vitals:   02/28/20 1526 02/28/20 1540  BP: (!) 178/48 (!) 154/39  Pulse: 72 72  Resp: 16 16  Temp: (!) 36 C   SpO2: 98% 94%    Last Pain:  Vitals:   02/28/20 1540  TempSrc:   PainSc: 2                  Syriana Croslin

## 2020-02-28 NOTE — Discharge Instructions (Signed)
AMBULATORY SURGERY  DISCHARGE INSTRUCTIONS   1) The drugs that you were given will stay in your system until tomorrow so for the next 24 hours you should not:  A) Drive an automobile B) Make any legal decisions C) Drink any alcoholic beverage   2) You may resume regular meals tomorrow.  Today it is better to start with liquids and gradually work up to solid foods.  You may eat anything you prefer, but it is better to start with liquids, then soup and crackers, and gradually work up to solid foods.   3) Please notify your doctor immediately if you have any unusual bleeding, trouble breathing, redness and pain at the surgery site, drainage, fever, or pain not relieved by medication.    4) Additional Instructions:        Please contact your physician with any problems or Same Day Surgery at [phone], Monday through Friday 6 am to 4 pm, or [place] [facility] number at [phone].You should expect to have some cramping and vaginal bleeding for about a week. This should taper off and subside, much like a period. If heavy bleeding continues or gets worse, you should contact the office for an earlier appointment.   Please call the office or physician on call for fever >101, severe pain, and heavy bleeding.   [phone]  NOTHING IN THE VAGINA FOR 2 WEEKS!!  Dr. Ward will discuss pathology results with you at your postop visit.  

## 2020-02-28 NOTE — Anesthesia Procedure Notes (Signed)
Procedure Name: Intubation Date/Time: 02/28/2020 1:25 PM Performed by: Reece Agar, CRNA Pre-anesthesia Checklist: Patient identified, Emergency Drugs available, Suction available and Patient being monitored Patient Re-evaluated:Patient Re-evaluated prior to induction Oxygen Delivery Method: Circle system utilized Preoxygenation: Pre-oxygenation with 100% oxygen Induction Type: IV induction, Cricoid Pressure applied and Rapid sequence Laryngoscope Size: McGraph and 3 Grade View: Grade I Tube type: Oral Tube size: 7.0 mm Number of attempts: 1 Airway Equipment and Method: Stylet Placement Confirmation: ETT inserted through vocal cords under direct vision,  positive ETCO2 and breath sounds checked- equal and bilateral Secured at: 21 cm Tube secured with: Tape Dental Injury: Teeth and Oropharynx as per pre-operative assessment

## 2020-02-28 NOTE — Anesthesia Preprocedure Evaluation (Addendum)
Anesthesia Evaluation  Patient identified by MRN, date of birth, ID band Patient awake    Reviewed: Allergy & Precautions, NPO status , Patient's Chart, lab work & pertinent test results  Airway Mallampati: III  TM Distance: <3 FB     Dental   Pulmonary shortness of breath and with exertion, COPD, Patient abstained from smoking., former smoker,    Pulmonary exam normal        Cardiovascular hypertension, + angina + Peripheral Vascular Disease and +CHF  Normal cardiovascular exam+ Valvular Problems/Murmurs AS      Neuro/Psych PSYCHIATRIC DISORDERS Depression    GI/Hepatic Neg liver ROS,   Endo/Other  Hypothyroidism Morbid obesity  Renal/GU negative Renal ROS  Female GU complaint     Musculoskeletal  (+) Arthritis , Osteoarthritis,    Abdominal (+) + obese,   Peds negative pediatric ROS (+)  Hematology   Anesthesia Other Findings   Reproductive/Obstetrics                            Anesthesia Physical Anesthesia Plan  ASA: IV  Anesthesia Plan: General   Post-op Pain Management:    Induction: Intravenous, Rapid sequence and Cricoid pressure planned  PONV Risk Score and Plan:   Airway Management Planned: Oral ETT  Additional Equipment:   Intra-op Plan:   Post-operative Plan: Extubation in OR and Possible Post-op intubation/ventilation  Informed Consent: I have reviewed the patients History and Physical, chart, labs and discussed the procedure including the risks, benefits and alternatives for the proposed anesthesia with the patient or authorized representative who has indicated his/her understanding and acceptance.     Dental advisory given  Plan Discussed with: CRNA and Surgeon  Anesthesia Plan Comments: (Talked with patient extensively about her increaded risks of anesthesia.  Patient appears to understand and agrees with going ahead with GOT to control airway for the case.   She also understands that the Ett may stay past conclusion of the case if she is weak.)       Anesthesia Quick Evaluation

## 2020-02-29 ENCOUNTER — Telehealth (INDEPENDENT_AMBULATORY_CARE_PROVIDER_SITE_OTHER): Payer: Self-pay

## 2020-02-29 ENCOUNTER — Encounter: Payer: Self-pay | Admitting: Obstetrics & Gynecology

## 2020-02-29 NOTE — Telephone Encounter (Signed)
Spoke to pt and gave her the instructions by the NP.

## 2020-02-29 NOTE — Telephone Encounter (Signed)
Based on previous discussions, the patient should be on atorvastatin, however if she hasn't been on it, we can wait and discuss at her upcoming office visit

## 2020-02-29 NOTE — Telephone Encounter (Signed)
Pt called and left a VM  On the nurses line that she doesn't know if she needs  to be taking atorvastatin.  Per the pt's last office note I made her aware that she did and  the pt made me aware that she needs a   refill on the medication as she has not been taking the medication and wasn't taking it when she was last seen in our office. Please advise

## 2020-03-04 LAB — SURGICAL PATHOLOGY

## 2020-03-07 ENCOUNTER — Other Ambulatory Visit: Payer: Self-pay

## 2020-03-07 ENCOUNTER — Ambulatory Visit
Admission: RE | Admit: 2020-03-07 | Discharge: 2020-03-07 | Disposition: A | Discharge status: Home / Self Care | Payer: Medicaid Other | Source: Ambulatory Visit | Attending: Oncology | Admitting: Oncology

## 2020-03-07 DIAGNOSIS — Z87891 Personal history of nicotine dependence: Secondary | ICD-10-CM | POA: Insufficient documentation

## 2020-03-07 DIAGNOSIS — Z122 Encounter for screening for malignant neoplasm of respiratory organs: Secondary | ICD-10-CM | POA: Diagnosis present

## 2020-03-08 ENCOUNTER — Encounter: Payer: Self-pay | Admitting: *Deleted

## 2020-03-11 ENCOUNTER — Ambulatory Visit (INDEPENDENT_AMBULATORY_CARE_PROVIDER_SITE_OTHER): Payer: Medicaid Other | Admitting: Nurse Practitioner

## 2020-03-11 ENCOUNTER — Encounter (INDEPENDENT_AMBULATORY_CARE_PROVIDER_SITE_OTHER): Payer: Medicaid Other

## 2020-03-26 ENCOUNTER — Encounter (INDEPENDENT_AMBULATORY_CARE_PROVIDER_SITE_OTHER): Payer: Medicaid Other

## 2020-03-26 ENCOUNTER — Ambulatory Visit (INDEPENDENT_AMBULATORY_CARE_PROVIDER_SITE_OTHER): Payer: Medicaid Other | Admitting: Nurse Practitioner

## 2020-03-26 ENCOUNTER — Encounter (INDEPENDENT_AMBULATORY_CARE_PROVIDER_SITE_OTHER): Payer: Self-pay

## 2020-04-15 ENCOUNTER — Telehealth: Payer: Self-pay | Admitting: Pulmonary Disease

## 2020-04-15 MED ORDER — MOMETASONE FURO-FORMOTEROL FUM 200-5 MCG/ACT IN AERO: 2.0000 | INHALATION_SPRAY | Freq: Two times a day (BID) | RESPIRATORY_TRACT | 6 refills | Status: DC

## 2020-04-15 NOTE — Telephone Encounter (Signed)
Rx for dulera 200 has been sent to preferred pharmacy. Patient is aware and voiced her understanding.  Nothing further needed.

## 2020-04-24 ENCOUNTER — Ambulatory Visit (INDEPENDENT_AMBULATORY_CARE_PROVIDER_SITE_OTHER): Payer: Medicaid Other | Admitting: Nurse Practitioner

## 2020-04-24 ENCOUNTER — Ambulatory Visit (INDEPENDENT_AMBULATORY_CARE_PROVIDER_SITE_OTHER): Payer: Medicaid Other

## 2020-04-24 ENCOUNTER — Encounter (INDEPENDENT_AMBULATORY_CARE_PROVIDER_SITE_OTHER): Payer: Self-pay | Admitting: Nurse Practitioner

## 2020-04-24 ENCOUNTER — Other Ambulatory Visit: Payer: Self-pay

## 2020-04-24 VITALS — BP 154/54 | HR 76 | Ht 62.0 in | Wt 327.0 lb

## 2020-04-24 DIAGNOSIS — Z87891 Personal history of nicotine dependence: Secondary | ICD-10-CM

## 2020-04-24 DIAGNOSIS — I6523 Occlusion and stenosis of bilateral carotid arteries: Secondary | ICD-10-CM

## 2020-04-24 DIAGNOSIS — I10 Essential (primary) hypertension: Secondary | ICD-10-CM

## 2020-04-25 NOTE — Progress Notes (Signed)
Subjective:    Patient ID: Kristie Dorsey, female    DOB: Nov 25, 1960, 60 y.o.   MRN: [id number] Chief Complaint  Patient presents with  . Follow-up    3 mo U/S     Kristie Dorsey is a 60 y.o. female.  The patient presents today for evaluation of carotid artery stenosis.  Since her last evaluation the patient has not had any focal neurological symptoms.  She was diagnosed several months ago with carotid artery stenosis by duplex.  Initially the patient had about a 70% right carotid stenosis with a 50% left carotid stenosis.  Since the patient's last office visit she is also stopped smoking.  Today noninvasive studies show left carotid stenosis of 40 to 59% with a 60 to 79% stenosis on the right ICA.  Based on the velocities the patient is likely greater than 75% on the right.     Review of Systems  Musculoskeletal: Positive for arthralgias.  All other systems reviewed and are negative.      Objective:   Physical Exam Vitals reviewed.  HENT:     Head: Normocephalic.  Cardiovascular:     Rate and Rhythm: Normal rate.     Pulses: Normal pulses.  Pulmonary:     Effort: Pulmonary effort is normal.  Skin:    General: Skin is warm and dry.  Neurological:     Mental Status: She is alert and oriented to person, place, and time.     Gait: Gait abnormal.  Psychiatric:        Mood and Affect: Mood normal.        Behavior: Behavior normal.        Thought Content: Thought content normal.        Judgment: Judgment normal.     BP (!) 154/54   Pulse 76   Ht 5\' 2"  (1.575 m)   Wt (!) 327 lb (148.3 kg)   LMP 02/21/2020   BMI 59.81 kg/m   Past Medical History:  Diagnosis Date  . Anginal pain (HCC)   . Arthritis   . Carotid stenosis   . CHF (congestive heart failure) (HCC)    probable, takes HCTZ as a "fluid pill"  . COPD (chronic obstructive pulmonary disease) (HCC)    probable, no specific diagnosis  . Depression    take fluoxetine  . Dyspnea   . Hypertension   .  Hypothyroidism   . Morbid obesity (HCC)   . Nonrheumatic aortic (valve) stenosis    s/p TAVR  . Thyroid disease    hypothryoidism    Social History   Socioeconomic History  . Marital status: Married    Spouse name: eugene  . Number of children: 2  . Years of education: Not on file  . Highest education level: Not on file  Occupational History  . Not on file  Tobacco Use  . Smoking status: Former Smoker    Packs/day: 1.00    Years: 46.00    Pack years: 46.00    Types: Cigarettes    Start date: 03/02/1973    Quit date: 11/01/2018    Years since quitting: 1.4  . Smokeless tobacco: Never Used  . Tobacco comment: Vaping/ 01/11/2020  Vaping Use  . Vaping Use: Every day  . Start date: 11/01/2018  . Substances: Nicotine  Substance and Sexual Activity  . Alcohol use: No  . Drug use: No  . Sexual activity: Not on file  Other Topics Concern  . Not on file  Social History Narrative  . Not on file   Social Determinants of Health   Financial Resource Strain: Not on file  Food Insecurity: Not on file  Transportation Needs: Not on file  Physical Activity: Not on file  Stress: Not on file  Social Connections: Not on file  Intimate Partner Violence: Not on file    Past Surgical History:  Procedure Laterality Date  . CARDIAC VALVE REPLACEMENT    . CHOLECYSTECTOMY    . HYSTEROSCOPY WITH D & C N/A 02/28/2020   Procedure: DILATATION AND CURETTAGE;  Surgeon: Ward, Elenora Fender, MD;  Location: [facility];  Service: Gynecology;  Laterality: N/A;  . LEFT HEART CATH AND CORONARY ANGIOGRAPHY N/A 04/14/2018   Procedure: LEFT HEART CATH AND CORONARY ANGIOGRAPHY;  Surgeon: Alwyn Pea, MD;  Location: [facility];  Service: Cardiovascular;  Laterality: N/A;  . TONSILLECTOMY    . TUBAL LIGATION      Family History  Adopted: Yes  Problem Relation Age of Onset  . Aneurysm Mother   . Cancer Father     No Known Allergies  CBC Latest Ref Rng & Units 02/28/2020 02/26/2020  09/22/2019  WBC 4.0 - 10.5 K/uL 7.8 6.9 8.0  Hemoglobin 12.0 - 15.0 g/dL 31.4 11.7(L) 11.2(L)  Hematocrit 36.0 - 46.0 % 37.5 38.5 37.6  Platelets 150 - 400 K/uL 189 195 189      CMP     Component Value Date/Time   NA 136 02/28/2020 1301   NA 136 02/27/2013 1054   K 4.2 02/28/2020 1301   K 4.2 02/27/2013 1054   CL 99 02/28/2020 1301   CL 105 02/27/2013 1054   CO2 26 02/28/2020 1301   CO2 32 02/27/2013 1054   GLUCOSE 99 02/28/2020 1301   GLUCOSE 100 (H) 02/27/2013 1054   BUN 17 02/28/2020 1301   BUN 11 02/27/2013 1054   CREATININE 0.87 02/28/2020 1301   CREATININE 0.72 02/27/2013 1054   CALCIUM 9.0 02/28/2020 1301   CALCIUM 9.1 02/27/2013 1054   PROT 7.3 09/22/2019 1141   ALBUMIN 3.5 09/22/2019 1141   AST 14 (L) 09/22/2019 1141   ALT 15 09/22/2019 1141   ALKPHOS 87 09/22/2019 1141   BILITOT 0.8 09/22/2019 1141   GFRNONAA >60 02/28/2020 1301   GFRNONAA >60 02/27/2013 1054   GFRAA >60 09/22/2019 1141   GFRAA >60 02/27/2013 1054     No results found.     Assessment & Plan:   1. Bilateral carotid artery stenosis The patient remains asymptomatic with respect to the carotid stenosis.  However, the patient has now progressed and has a lesion the is >70%.  Patient should undergo CT angiography of the carotid arteries to define the degree of stenosis of the internal carotid arteries bilaterally and the anatomic suitability for surgery vs. intervention.  If the patient does indeed need surgery cardiac clearance will be required, once cleared the patient will be scheduled for surgery.  The risks, benefits and alternative therapies were reviewed in detail with the patient.  All questions were answered.  The patient agrees to proceed with imaging.  Continue antiplatelet therapy as prescribed. Continue management of CAD, HTN and Hyperlipidemia. Healthy heart diet, encouraged exercise at least 4 times per week.   - CT ANGIO NECK W OR WO CONTRAST; Future - Creatinine, IStat;  Future  2. Primary hypertension Continue antihypertensive medications as already ordered, these medications have been reviewed and there are no changes at this time.   3. Former smoker Patient  has stop smoking.  She has stopped since approximately December.  Smoking cessation is encouraged.   Current Outpatient Medications on File Prior to Visit  Medication Sig Dispense Refill  . albuterol (PROVENTIL HFA;VENTOLIN HFA) 108 (90 Base) MCG/ACT inhaler Inhale 2-4 puffs by mouth every 4 hours as needed for wheezing, cough, and/or shortness of breath (Patient taking differently: Inhale 2 puffs into the lungs every 4 (four) hours as needed for wheezing or shortness of breath. Inhale 2-4 puffs by mouth every 4 hours as needed for wheezing, cough, and/or shortness of breath) 1 Inhaler 1  . atorvastatin (LIPITOR) 10 MG tablet Take 10 mg by mouth daily.    Marland Kitchen buPROPion (WELLBUTRIN SR) 150 MG 12 hr tablet Take 150 mg by mouth 2 (two) times daily.    Marland Kitchen buPROPion (WELLBUTRIN XL) 150 MG 24 hr tablet Take 150 mg by mouth 2 (two) times daily.    . clopidogrel (PLAVIX) 75 MG tablet Take 75 mg by mouth daily.    Marland Kitchen FLUoxetine (PROZAC) 40 MG capsule Take 40 mg by mouth daily.    . furosemide (LASIX) 40 MG tablet Take 40 mg by mouth daily.     Marland Kitchen levothyroxine (SYNTHROID, LEVOTHROID) 75 MCG tablet Take 75 mcg by mouth daily before breakfast.     . megestrol (MEGACE) 40 MG tablet Take by mouth.    . mometasone-formoterol (DULERA) 200-5 MCG/ACT AERO Inhale 2 puffs into the lungs 2 (two) times daily. 13 g 6  . Multiple Vitamins-Minerals (MULTIVITAMIN WITH MINERALS) tablet Take 1 tablet by mouth daily.    . sacubitril-valsartan (ENTRESTO) 49-51 MG Take 1 tablet by mouth 2 (two) times daily.    Marland Kitchen spironolactone (ALDACTONE) 25 MG tablet Take 25 mg by mouth daily.    . Tiotropium Bromide Monohydrate (SPIRIVA RESPIMAT) 2.5 MCG/ACT AERS Inhale 2 puffs into the lungs daily. 4 g 11  . traZODone (DESYREL) 100 MG tablet Take  100 mg by mouth at bedtime.     No current facility-administered medications on file prior to visit.    There are no Patient Instructions on file for this visit. No follow-ups on file.   Georgiana Spinner, NP

## 2020-05-02 ENCOUNTER — Encounter: Payer: Self-pay | Admitting: Pulmonary Disease

## 2020-05-02 ENCOUNTER — Ambulatory Visit (INDEPENDENT_AMBULATORY_CARE_PROVIDER_SITE_OTHER): Payer: Medicaid Other | Admitting: Pulmonary Disease

## 2020-05-02 ENCOUNTER — Other Ambulatory Visit: Payer: Self-pay

## 2020-05-02 VITALS — BP 136/84 | HR 66 | Temp 97.5°F | Ht 62.0 in | Wt 330.0 lb

## 2020-05-02 DIAGNOSIS — E662 Morbid (severe) obesity with alveolar hypoventilation: Secondary | ICD-10-CM | POA: Diagnosis not present

## 2020-05-02 DIAGNOSIS — F17290 Nicotine dependence, other tobacco product, uncomplicated: Secondary | ICD-10-CM

## 2020-05-02 DIAGNOSIS — G4733 Obstructive sleep apnea (adult) (pediatric): Secondary | ICD-10-CM

## 2020-05-02 DIAGNOSIS — I5042 Chronic combined systolic (congestive) and diastolic (congestive) heart failure: Secondary | ICD-10-CM | POA: Diagnosis not present

## 2020-05-02 NOTE — Progress Notes (Signed)
Subjective:    Patient ID: Kristie Dorsey, female    DOB: 07-04-1960, 60 y.o.   MRN: [id number]  HPI Is a 60 year old morbidly obese woman, with daily use of vape (quit cigarettes 2020) who follows up on the issue of dyspnea.  This is mostly due to obesity with obesity hypoventilation.  She has been diagnosed with obstructive sleep apnea by a sleep study performed 09 October 2019 and is on BiPAP.  She has noted in the past improvement with her symptoms of dyspnea with the BiPAP.  Pulmonary function testing has shown no obstructive defect and mostly restrictive defect due to obesity.  She had a CT scan on 13 October 2019 that showed no PE no evidence of fibrosis or ILD and just some mild interstitial pulmonary edema with cardiomegaly.  Her ejection fraction by nuclear study is 30%.  She is status post TAVR in September 2020.  Today she presents telling me that she has been compliant with her BiPAP however on her compliance report shows that she has only used it 3 out of the 30 days and only 1 day over 4 hours.  She then tells me that it is because she has issues with her mask.  Her DME is [employer].  I have asked her to reach out to [employer] so that she can be refitted for a mask.  I have also reiterated to her that with her cardiac issues and her severe sleep apnea she needs to be compliant with her BiPAP.  She is compliant with inhalers however does not notice that these do anything for her.  As noted on her PFTs her main pulmonary finding is that of a decrease ERV due to obesity.  She was recently treated by Dr. Willeen Cass, ENT, for sinusitis.  She follows at [facility] and with Dr. Dorothyann Peng for her issues of cardiomyopathy and status post TAVR.  She is followed by obstructives and gynecology for issues with postmenopausal bleeding.  In December 2021 she was started on Megace for postmenopausal bleeding.   Review of Systems A 10 point review of systems was performed and it is as noted above otherwise  negative.  Patient Active Problem List   Diagnosis Date Noted  . Carotid stenosis 12/05/2019  . Pain in limb 12/05/2019  . Electronic cigarette use 09/22/2019  . Dyspnea on exertion 09/22/2019  . Depression 04/10/2019  . Class 3 severe obesity with body mass index (BMI) of 60.0 to 69.9 in adult (HCC) 12/09/2018  . 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 Chronic systolic congestive heart failure (HCC) 04/27/2018  . Nonrheumatic aortic (valve) stenosis 04/27/2018  . HTN (hypertension) 03/11/2018  . Former smoker 03/11/2018  . Snoring 03/11/2018  . Lymphedema 03/11/2018  . Tobacco use disorder 03/11/2018   No Known Allergies Current Meds  Medication Sig  . albuterol (PROVENTIL HFA;VENTOLIN HFA) 108 (90 Base) MCG/ACT inhaler Inhale 2-4 puffs by mouth every 4 hours as needed for wheezing, cough, and/or shortness of breath (Patient taking differently: Inhale 2 puffs into the lungs every 4 (four) hours as needed for wheezing or shortness of breath. Inhale 2-4 puffs by mouth every 4 hours as needed for wheezing, cough, and/or shortness of breath)  . atorvastatin (LIPITOR) 10 MG tablet Take 10 mg by mouth daily.  Marland Kitchen buPROPion (WELLBUTRIN SR) 150 MG 12 hr tablet Take 150 mg by mouth 2 (two) times daily.  . clopidogrel (PLAVIX) 75 MG tablet Take 75 mg by mouth daily.  Marland Kitchen FLUoxetine (  PROZAC) 40 MG capsule Take 40 mg by mouth daily.  . furosemide (LASIX) 40 MG tablet Take 40 mg by mouth daily.   Marland Kitchen levothyroxine (SYNTHROID, LEVOTHROID) 75 MCG tablet Take 75 mcg by mouth daily before breakfast.   . megestrol (MEGACE) 40 MG tablet Take by mouth.  . mometasone-formoterol (DULERA) 200-5 MCG/ACT AERO Inhale 2 puffs into the lungs 2 (two) times daily.  . Multiple Vitamins-Minerals (MULTIVITAMIN WITH MINERALS) tablet Take 1 tablet by mouth daily.  . sacubitril-valsartan (ENTRESTO) 49-51 MG Take 1 tablet by mouth 2 (two) times daily.  Marland Kitchen spironolactone (ALDACTONE) 25 MG tablet Take 25  mg by mouth daily.  . Tiotropium Bromide Monohydrate (SPIRIVA RESPIMAT) 2.5 MCG/ACT AERS Inhale 2 puffs into the lungs daily.  . traZODone (DESYREL) 100 MG tablet Take 100 mg by mouth at bedtime.  . [DISCONTINUED] buPROPion (WELLBUTRIN XL) 150 MG 24 hr tablet Take 150 mg by mouth 2 (two) times daily.       Objective:   Physical Exam BP 136/84 (BP Location: Left Arm, Cuff Size: Normal)   Pulse 66   Temp (!) 97.5 F (36.4 C) (Temporal)   Ht 5\' 2"  (1.575 m)   Wt (!) 330 lb (149.7 kg)   LMP 02/21/2020   SpO2 97%   BMI 60.36 kg/m  GENERAL: Morbidly obese woman, no acute distress.  No conversational dyspnea.  Presents in transport chair. HEAD: Normocephalic, atraumatic.  EYES: Pupils equal, round, reactive to light.  No scleral icterus.  MOUTH: Nose/mouth/throat not examined due to masking requirements for COVID 19. NECK: Supple. No thyromegaly. Trachea midline. No JVD.  No adenopathy. PULMONARY: Good air entry bilaterally.  No adventitious sounds whatsoever. CARDIOVASCULAR: S1 and S2. Regular rate and rhythm.  Soft 1/6 systolic murmur left sternal border, unchanged from prior. ABDOMEN: Obese, otherwise benign MUSCULOSKELETAL: No joint deformity, no clubbing, there is 1+ to 2+ pitting edema lower extremities (chronic). NEUROLOGIC: No overt focal deficit, speech is fluent. SKIN: Intact,warm,dry.  On limited exam no rashes PSYCH: Mood and behavior normal.       Assessment & Plan:     ICD-10-CM   1. Extreme obesity with alveolar hypoventilation (HCC)  E66.2    Major cause of dyspnea Weight loss recommended Recommended DASH diet  2. Chronic combined systolic (congestive) and diastolic (congestive) heart failure (HCC)  I50.42    Adds to her sensation of dyspnea LVEF 35%, DD grade II Most recent echo 01/15/2020 Continue management per cardiology  3. Obstructive sleep apnea treated with bilevel positive airway pressure (BiPAP)  G47.33    Needs to follow-up with [employer] for new  mask Needs to be compliant with BiPAP Will have sleep MD see her  4. Nicotine dependence due to vaping tobacco product  F17.290    Advised discontinuation of vaping   Discussion:  Patient's issues with dyspnea multifactorial.  She has noted in the past improvement once she was started on BiPAP.  She however has not been compliant and cites issues with her mask.  I have advised her to call her DME company, [employer], for a mask fitting.  She has extreme obesity with alveolar hypoventilation and this is another main issue with regards to her dyspnea.  Her current BMI is 60.  Recently she was started on Megace which is an appetite stimulant for issues with postmenopausal bleeding.  It would be in her best interest to use a different medication for this.  She desperately needs to lose weight.  She wanted a referral to bariatrics  however with her current cardiac situation and other comorbidities she would be a very poor surgical candidate.  Commended that she start a DASH diet this was conveyed to her and also use a weight watchers type caloric restriction recommendation.  Instructions for DASH diet were printed for the patient.  With regards to restrictive physiology on PFTs this is due to obesity as noted by an ERV of 9% she has only mild diffusion capacity impairment which correlates with extreme obesity with alveolar hypoventilation.  She will be follow-up with sleep medicine in our practice.  Gailen Shelter, MD [facility]   *This note was dictated using voice recognition software/Dragon.  Despite best efforts to proofread, errors can occur which can change the meaning.  Any change was purely unintentional.

## 2020-05-02 NOTE — Patient Instructions (Signed)
Please get back with [employer] so that your mask can be fitted properly.  You only use your BiPAP equipment for 3 nights out of the last 30.  Sleep apnea can damage your heart further.  Your shortness of breath is related likely to cardiac issues given that your oxygen levels remained good during your episodes of shortness of breath.  Your heart this weak and you may need further evaluation on this issue.   Weight loss is going to be very important for your wellbeing and to decrease the sensation of shortness of breath.   We are going to schedule you to see one of our sleep doctors.   For weight loss I recommend DASH diet.  Instructions are attached.

## 2020-05-03 ENCOUNTER — Encounter: Payer: Self-pay | Admitting: Pulmonary Disease

## 2020-05-08 ENCOUNTER — Telehealth (INDEPENDENT_AMBULATORY_CARE_PROVIDER_SITE_OTHER): Payer: Self-pay

## 2020-05-08 NOTE — Telephone Encounter (Signed)
Pt called and left a VM on the nurses line wanting to know when was her CT scheduled. Per the pt's chart the CT needed to be scheduled I called the pt and gave her the number to radiology scheduling  to schedule her CT, I told  the pt to make sure she calls our office back so we can set her up with an appointment to have her results read.

## 2020-05-21 ENCOUNTER — Ambulatory Visit
Admission: RE | Admit: 2020-05-21 | Discharge: 2020-05-21 | Disposition: A | Discharge status: Home / Self Care | Payer: Medicaid Other | Source: Ambulatory Visit | Attending: Nurse Practitioner | Admitting: Nurse Practitioner

## 2020-05-21 ENCOUNTER — Other Ambulatory Visit: Payer: Self-pay

## 2020-05-21 DIAGNOSIS — I6523 Occlusion and stenosis of bilateral carotid arteries: Secondary | ICD-10-CM | POA: Diagnosis not present

## 2020-05-21 LAB — POCT I-STAT CREATININE: Creatinine, Ser: 1 mg/dL (ref 0.44–1.00)

## 2020-05-21 MED ORDER — IOHEXOL 350 MG/ML SOLN
75.0000 mL | Freq: Once | INTRAVENOUS | Status: AC | PRN
  Administered 2020-05-21: 75 mL via INTRAVENOUS

## 2020-05-22 NOTE — Progress Notes (Signed)
Bring her in with Kristie Dorsey..CT review

## 2020-05-22 NOTE — Progress Notes (Signed)
Patient has already been scheduled

## 2020-05-28 ENCOUNTER — Ambulatory Visit (INDEPENDENT_AMBULATORY_CARE_PROVIDER_SITE_OTHER): Payer: Medicaid Other | Admitting: Vascular Surgery

## 2020-05-28 ENCOUNTER — Encounter (INDEPENDENT_AMBULATORY_CARE_PROVIDER_SITE_OTHER): Payer: Self-pay | Admitting: Vascular Surgery

## 2020-05-28 ENCOUNTER — Other Ambulatory Visit: Payer: Self-pay

## 2020-05-28 VITALS — BP 173/69 | HR 74 | Resp 16 | Wt 326.4 lb

## 2020-05-28 DIAGNOSIS — I10 Essential (primary) hypertension: Secondary | ICD-10-CM | POA: Diagnosis not present

## 2020-05-28 DIAGNOSIS — I6523 Occlusion and stenosis of bilateral carotid arteries: Secondary | ICD-10-CM | POA: Diagnosis not present

## 2020-05-28 DIAGNOSIS — N950 Postmenopausal bleeding: Secondary | ICD-10-CM | POA: Insufficient documentation

## 2020-05-28 DIAGNOSIS — M79604 Pain in right leg: Secondary | ICD-10-CM | POA: Diagnosis not present

## 2020-05-28 NOTE — H&P (View-Only) (Signed)
  MRN : [id number]  Kristie Dorsey is a 59 y.o. (09/03/1960) female who presents with chief complaint of  Chief Complaint  Patient presents with  . Follow-up    Ct results  .  History of Present Illness: Patient returns today in follow up of her carotid disease.  She is doing well without any specific complaints today.  No new issues since her last visit.  She has undergone a CT angiogram of the neck which I have independently reviewed.  The official report says this was difficult to discern the degree of stenosis but was read out as greater than 70%.  I would agree that this was clearly greater than 70% and likely in the 80 to 85% range by my review on the right side.  The left side has more mild disease although calcific plaque was present.  It did not appear to be more than 50% to my review.  Current Outpatient Medications  Medication Sig Dispense Refill  . albuterol (PROVENTIL HFA;VENTOLIN HFA) 108 (90 Base) MCG/ACT inhaler Inhale 2-4 puffs by mouth every 4 hours as needed for wheezing, cough, and/or shortness of breath (Patient taking differently: Inhale 2 puffs into the lungs every 4 (four) hours as needed for wheezing or shortness of breath. Inhale 2-4 puffs by mouth every 4 hours as needed for wheezing, cough, and/or shortness of breath) 1 Inhaler 1  . atorvastatin (LIPITOR) 10 MG tablet Take 10 mg by mouth daily.    . buPROPion (WELLBUTRIN SR) 150 MG 12 hr tablet Take 150 mg by mouth 2 (two) times daily.    . clopidogrel (PLAVIX) 75 MG tablet Take 75 mg by mouth daily.    . FLUoxetine (PROZAC) 40 MG capsule Take 40 mg by mouth daily.    . furosemide (LASIX) 40 MG tablet Take 40 mg by mouth daily.     . levothyroxine (SYNTHROID, LEVOTHROID) 75 MCG tablet Take 75 mcg by mouth daily before breakfast.     . megestrol (MEGACE) 40 MG tablet Take by mouth.    . mometasone-formoterol (DULERA) 200-5 MCG/ACT AERO Inhale 2 puffs into the lungs 2 (two) times daily. 13 g 6  . Multiple  Vitamins-Minerals (MULTIVITAMIN WITH MINERALS) tablet Take 1 tablet by mouth daily.    . sacubitril-valsartan (ENTRESTO) 49-51 MG Take 1 tablet by mouth 2 (two) times daily.    . spironolactone (ALDACTONE) 25 MG tablet Take 25 mg by mouth daily.    . Tiotropium Bromide Monohydrate (SPIRIVA RESPIMAT) 2.5 MCG/ACT AERS Inhale 2 puffs into the lungs daily. 4 g 11  . traZODone (DESYREL) 100 MG tablet Take 100 mg by mouth at bedtime.     No current facility-administered medications for this visit.    Past Medical History:  Diagnosis Date  . Anginal pain (HCC)   . Arthritis   . Carotid stenosis   . CHF (congestive heart failure) (HCC)    probable, takes HCTZ as a "fluid pill"  . COPD (chronic obstructive pulmonary disease) (HCC)    probable, no specific diagnosis  . Depression    take fluoxetine  . Dyspnea   . Hypertension   . Hypothyroidism   . Morbid obesity (HCC)   . Nonrheumatic aortic (valve) stenosis    s/p TAVR  . Thyroid disease    hypothryoidism    Past Surgical History:  Procedure Laterality Date  . CARDIAC VALVE REPLACEMENT    . CHOLECYSTECTOMY    . HYSTEROSCOPY WITH D & C N/A 02/28/2020   Procedure:   DILATATION AND CURETTAGE;  Surgeon: Ward, Elenora Fender, MD;  Location: [facility];  Service: Gynecology;  Laterality: N/A;  . LEFT HEART CATH AND CORONARY ANGIOGRAPHY N/A 04/14/2018   Procedure: LEFT HEART CATH AND CORONARY ANGIOGRAPHY;  Surgeon: Alwyn Pea, MD;  Location: [facility];  Service: Cardiovascular;  Laterality: N/A;  . TONSILLECTOMY    . TUBAL LIGATION       Social History   Tobacco Use  . Smoking status: Former Smoker    Packs/day: 1.50    Years: 46.00    Pack years: 69.00    Types: Cigarettes    Start date: 03/02/1973    Quit date: 11/01/2018    Years since quitting: 1.5  . Smokeless tobacco: Never Used  . Tobacco comment: Vaping daily--05/02/2020  Vaping Use  . Vaping Use: Every day  . Start date: 11/01/2018  . Substances: Nicotine   Substance Use Topics  . Alcohol use: No  . Drug use: No     Family History  Adopted: Yes  Problem Relation Age of Onset  . Aneurysm Mother   . Cancer Father   no bleeding or clotting disorders  No Known Allergies  REVIEW OF SYSTEMS (Negative unless checked)  Constitutional: [] ?Weight loss  [] ?Fever  [] ?Chills Cardiac: [] ?Chest pain   [] ?Chest pressure   [] ?Palpitations   [] ?Shortness of breath when laying flat   [] ?Shortness of breath at rest   [x] ?Shortness of breath with exertion. Vascular:  [] ?Pain in legs with walking   [x] ?Pain in legs at rest   [x] ?Pain in legs when laying flat   [] ?Claudication   [] ?Pain in feet when walking  [] ?Pain in feet at rest  [] ?Pain in feet when laying flat   [] ?History of DVT   [] ?Phlebitis   [] ?Swelling in legs   [] ?Varicose veins   [] ?Non-healing ulcers Pulmonary:   [] ?Uses home oxygen   [] ?Productive cough   [] ?Hemoptysis   [] ?Wheeze  [] ?COPD   [] ?Asthma Neurologic:  [] ?Dizziness  [] ?Blackouts   [] ?Seizures   [] ?History of stroke   [] ?History of TIA  [] ?Aphasia   [] ?Temporary blindness   [] ?Dysphagia   [] ?Weakness or numbness in arms   [] ?Weakness or numbness in legs Musculoskeletal:  [x] ?Arthritis   [] ?Joint swelling   [x] ?Joint pain   [] ?Low back pain Hematologic:  [] ?Easy bruising  [] ?Easy bleeding   [] ?Hypercoagulable state   [] ?Anemic  [] ?Hepatitis Gastrointestinal:  [] ?Blood in stool   [] ?Vomiting blood  [] ?Gastroesophageal reflux/heartburn   [] ?Abdominal pain Genitourinary:  [] ?Chronic kidney disease   [] ?Difficult urination  [] ?Frequent urination  [] ?Burning with urination   [] ?Hematuria Skin:  [] ?Rashes   [] ?Ulcers   [] ?Wounds Psychological:  [] ?History of anxiety   [] ? History of major depression.  Physical Examination  BP (!) 173/69 (BP Location: Right Arm)   Pulse 74   Resp 16   Wt (!) 326 lb 6.4 oz (148.1 kg)   LMP 02/21/2020   BMI 59.70 kg/m  Gen:  WD/WN, NAD.  Morbidly obese with a large pannus. Head: [place]/AT, No temporalis  wasting. Ear/Nose/Throat: Hearing grossly intact, nares w/o erythema or drainage Eyes: Conjunctiva clear. Sclera non-icteric Neck: Supple.  Trachea midline Pulmonary:  Good air movement, no use of accessory muscles.  Cardiac: RRR, no JVD Vascular:  Vessel Right Left  Radial Palpable Palpable       Musculoskeletal: M/S 5/5 throughout.  No deformity or atrophy. Mild BLE edema. Neurologic: Sensation grossly intact in extremities.  Symmetrical.  Speech is fluent.  Psychiatric: Judgment intact, Mood &  affect appropriate for pt's clinical situation. Dermatologic: No rashes or ulcers noted.  No cellulitis or open wounds.       Labs Recent Results (from the past 2160 hour(s))  I-STAT creatinine     Status: None   Collection Time: 05/21/20 11:59 AM  Result Value Ref Range   Creatinine, Ser 1.00 0.44 - 1.00 mg/dL    Radiology CT ANGIO NECK W OR WO CONTRAST  Result Date: 05/22/2020 CLINICAL DATA:  Bilateral carotid artery stenosis EXAM: CT ANGIOGRAPHY NECK TECHNIQUE: Multidetector CT imaging of the neck was performed using the standard protocol during bolus administration of intravenous contrast. Multiplanar CT image reconstructions and MIPs were obtained to evaluate the vascular anatomy. Carotid stenosis measurements (when applicable) are obtained utilizing NASCET criteria, using the distal internal carotid diameter as the denominator. CONTRAST:  61mL OMNIPAQUE IOHEXOL 350 MG/ML SOLN COMPARISON:  None. FINDINGS: Artifact is present due to body habitus and mild motion. Aortic arch: Great vessel origins are patent. Right carotid system: Common carotid is patent with minor calcified plaque. Internal and external carotids are patent. Mixed plaque at the bifurcation and proximal internal carotid. Luminal measurement is limited by artifact. Stenosis is likely greater than 70%. There is also high-grade stenosis at the external carotid origin. Left carotid system: Common carotid is patent with mild  calcified plaque. Internal and external carotids are patent. Primarily calcified plaque at the common carotid bifurcation and internal carotid origin. Minimal ICA origin stenosis. Vertebral arteries: Patent and codominant.  No stenosis. Skeleton: Degenerative changes of the cervical spine. Other neck: Negative. Upper chest: Mild emphysema.  No apical lung mass. IMPRESSION: Mixed plaque at the right common carotid bifurcation and proximal internal carotid. Stenosis measurement is difficult but likely greater than 70%. Minimal stenosis at the left ICA origin. Electronically Signed   By: Guadlupe Spanish M.D.   On: 05/22/2020 10:15    Assessment/Plan  Carotid stenosis She has undergone a CT angiogram of the neck which I have independently reviewed.  The official report says this was difficult to discern the degree of stenosis but was read out as greater than 70%.  I would agree that this was clearly greater than 70% and likely in the 80 to 85% range by my review on the right side.  The left side has more mild disease although calcific plaque was present.  It did not appear to be more than 50% to my review. We had a long discussion today about options for treatment.  We discussed the different surgery carotid endarterectomy and carotid stent placement.  Given her habitus and the anatomy of the lesion, I believe right carotid endarterectomy will be the less morbid procedure for her.  Her size makes either procedure somewhat morbid.  The patient has been described the risks and benefits of carotid endarterectomy in detail and is agreeable to proceed.  HTN (hypertension) blood pressure control important in reducing the progression of atherosclerotic disease. On appropriate oral medications.   Pain in limb Previously checked for PAD but her arterial perfusion was normal.  Musculoskeletal and neurogenic pain more likely.    Festus Barren, MD  05/29/2020 8:51 AM    This note was created with Dragon medical  transcription system.  Any errors from dictation are purely unintentional

## 2020-05-28 NOTE — Progress Notes (Signed)
MRN : [id number]  Kristie Dorsey is a 60 y.o. (1960/08/20) female who presents with chief complaint of  Chief Complaint  Patient presents with  . Follow-up    Ct results  .  History of Present Illness: Patient returns today in follow up of her carotid disease.  She is doing well without any specific complaints today.  No new issues since her last visit.  She has undergone a CT angiogram of the neck which I have independently reviewed.  The official report says this was difficult to discern the degree of stenosis but was read out as greater than 70%.  I would agree that this was clearly greater than 70% and likely in the 80 to 85% range by my review on the right side.  The left side has more mild disease although calcific plaque was present.  It did not appear to be more than 50% to my review.  Current Outpatient Medications  Medication Sig Dispense Refill  . albuterol (PROVENTIL HFA;VENTOLIN HFA) 108 (90 Base) MCG/ACT inhaler Inhale 2-4 puffs by mouth every 4 hours as needed for wheezing, cough, and/or shortness of breath (Patient taking differently: Inhale 2 puffs into the lungs every 4 (four) hours as needed for wheezing or shortness of breath. Inhale 2-4 puffs by mouth every 4 hours as needed for wheezing, cough, and/or shortness of breath) 1 Inhaler 1  . atorvastatin (LIPITOR) 10 MG tablet Take 10 mg by mouth daily.    Marland Kitchen buPROPion (WELLBUTRIN SR) 150 MG 12 hr tablet Take 150 mg by mouth 2 (two) times daily.    . clopidogrel (PLAVIX) 75 MG tablet Take 75 mg by mouth daily.    Marland Kitchen FLUoxetine (PROZAC) 40 MG capsule Take 40 mg by mouth daily.    . furosemide (LASIX) 40 MG tablet Take 40 mg by mouth daily.     Marland Kitchen levothyroxine (SYNTHROID, LEVOTHROID) 75 MCG tablet Take 75 mcg by mouth daily before breakfast.     . megestrol (MEGACE) 40 MG tablet Take by mouth.    . mometasone-formoterol (DULERA) 200-5 MCG/ACT AERO Inhale 2 puffs into the lungs 2 (two) times daily. 13 g 6  . Multiple  Vitamins-Minerals (MULTIVITAMIN WITH MINERALS) tablet Take 1 tablet by mouth daily.    . sacubitril-valsartan (ENTRESTO) 49-51 MG Take 1 tablet by mouth 2 (two) times daily.    Marland Kitchen spironolactone (ALDACTONE) 25 MG tablet Take 25 mg by mouth daily.    . Tiotropium Bromide Monohydrate (SPIRIVA RESPIMAT) 2.5 MCG/ACT AERS Inhale 2 puffs into the lungs daily. 4 g 11  . traZODone (DESYREL) 100 MG tablet Take 100 mg by mouth at bedtime.     No current facility-administered medications for this visit.    Past Medical History:  Diagnosis Date  . Anginal pain (HCC)   . Arthritis   . Carotid stenosis   . CHF (congestive heart failure) (HCC)    probable, takes HCTZ as a "fluid pill"  . COPD (chronic obstructive pulmonary disease) (HCC)    probable, no specific diagnosis  . Depression    take fluoxetine  . Dyspnea   . Hypertension   . Hypothyroidism   . Morbid obesity (HCC)   . Nonrheumatic aortic (valve) stenosis    s/p TAVR  . Thyroid disease    hypothryoidism    Past Surgical History:  Procedure Laterality Date  . CARDIAC VALVE REPLACEMENT    . CHOLECYSTECTOMY    . HYSTEROSCOPY WITH D & C N/A 02/28/2020   Procedure:  DILATATION AND CURETTAGE;  Surgeon: Ward, Elenora Fender, MD;  Location: [facility];  Service: Gynecology;  Laterality: N/A;  . LEFT HEART CATH AND CORONARY ANGIOGRAPHY N/A 04/14/2018   Procedure: LEFT HEART CATH AND CORONARY ANGIOGRAPHY;  Surgeon: Alwyn Pea, MD;  Location: [facility];  Service: Cardiovascular;  Laterality: N/A;  . TONSILLECTOMY    . TUBAL LIGATION       Social History   Tobacco Use  . Smoking status: Former Smoker    Packs/day: 1.50    Years: 46.00    Pack years: 69.00    Types: Cigarettes    Start date: 03/02/1973    Quit date: 11/01/2018    Years since quitting: 1.5  . Smokeless tobacco: Never Used  . Tobacco comment: Vaping daily--05/02/2020  Vaping Use  . Vaping Use: Every day  . Start date: 11/01/2018  . Substances: Nicotine   Substance Use Topics  . Alcohol use: No  . Drug use: No     Family History  Adopted: Yes  Problem Relation Age of Onset  . Aneurysm Mother   . Cancer Father   no bleeding or clotting disorders  No Known Allergies  REVIEW OF SYSTEMS (Negative unless checked)  Constitutional: [] ?Weight loss  [] ?Fever  [] ?Chills Cardiac: [] ?Chest pain   [] ?Chest pressure   [] ?Palpitations   [] ?Shortness of breath when laying flat   [] ?Shortness of breath at rest   [x] ?Shortness of breath with exertion. Vascular:  [] ?Pain in legs with walking   [x] ?Pain in legs at rest   [x] ?Pain in legs when laying flat   [] ?Claudication   [] ?Pain in feet when walking  [] ?Pain in feet at rest  [] ?Pain in feet when laying flat   [] ?History of DVT   [] ?Phlebitis   [] ?Swelling in legs   [] ?Varicose veins   [] ?Non-healing ulcers Pulmonary:   [] ?Uses home oxygen   [] ?Productive cough   [] ?Hemoptysis   [] ?Wheeze  [] ?COPD   [] ?Asthma Neurologic:  [] ?Dizziness  [] ?Blackouts   [] ?Seizures   [] ?History of stroke   [] ?History of TIA  [] ?Aphasia   [] ?Temporary blindness   [] ?Dysphagia   [] ?Weakness or numbness in arms   [] ?Weakness or numbness in legs Musculoskeletal:  [x] ?Arthritis   [] ?Joint swelling   [x] ?Joint pain   [] ?Low back pain Hematologic:  [] ?Easy bruising  [] ?Easy bleeding   [] ?Hypercoagulable state   [] ?Anemic  [] ?Hepatitis Gastrointestinal:  [] ?Blood in stool   [] ?Vomiting blood  [] ?Gastroesophageal reflux/heartburn   [] ?Abdominal pain Genitourinary:  [] ?Chronic kidney disease   [] ?Difficult urination  [] ?Frequent urination  [] ?Burning with urination   [] ?Hematuria Skin:  [] ?Rashes   [] ?Ulcers   [] ?Wounds Psychological:  [] ?History of anxiety   [] ? History of major depression.  Physical Examination  BP (!) 173/69 (BP Location: Right Arm)   Pulse 74   Resp 16   Wt (!) 326 lb 6.4 oz (148.1 kg)   LMP 02/21/2020   BMI 59.70 kg/m  Gen:  WD/WN, NAD.  Morbidly obese with a large pannus. Head: [place]/AT, No temporalis  wasting. Ear/Nose/Throat: Hearing grossly intact, nares w/o erythema or drainage Eyes: Conjunctiva clear. Sclera non-icteric Neck: Supple.  Trachea midline Pulmonary:  Good air movement, no use of accessory muscles.  Cardiac: RRR, no JVD Vascular:  Vessel Right Left  Radial Palpable Palpable       Musculoskeletal: M/S 5/5 throughout.  No deformity or atrophy. Mild BLE edema. Neurologic: Sensation grossly intact in extremities.  Symmetrical.  Speech is fluent.  Psychiatric: Judgment intact, Mood &  affect appropriate for pt's clinical situation. Dermatologic: No rashes or ulcers noted.  No cellulitis or open wounds.       Labs Recent Results (from the past 2160 hour(s))  I-STAT creatinine     Status: None   Collection Time: 05/21/20 11:59 AM  Result Value Ref Range   Creatinine, Ser 1.00 0.44 - 1.00 mg/dL    Radiology CT ANGIO NECK W OR WO CONTRAST  Result Date: 05/22/2020 CLINICAL DATA:  Bilateral carotid artery stenosis EXAM: CT ANGIOGRAPHY NECK TECHNIQUE: Multidetector CT imaging of the neck was performed using the standard protocol during bolus administration of intravenous contrast. Multiplanar CT image reconstructions and MIPs were obtained to evaluate the vascular anatomy. Carotid stenosis measurements (when applicable) are obtained utilizing NASCET criteria, using the distal internal carotid diameter as the denominator. CONTRAST:  61mL OMNIPAQUE IOHEXOL 350 MG/ML SOLN COMPARISON:  None. FINDINGS: Artifact is present due to body habitus and mild motion. Aortic arch: Great vessel origins are patent. Right carotid system: Common carotid is patent with minor calcified plaque. Internal and external carotids are patent. Mixed plaque at the bifurcation and proximal internal carotid. Luminal measurement is limited by artifact. Stenosis is likely greater than 70%. There is also high-grade stenosis at the external carotid origin. Left carotid system: Common carotid is patent with mild  calcified plaque. Internal and external carotids are patent. Primarily calcified plaque at the common carotid bifurcation and internal carotid origin. Minimal ICA origin stenosis. Vertebral arteries: Patent and codominant.  No stenosis. Skeleton: Degenerative changes of the cervical spine. Other neck: Negative. Upper chest: Mild emphysema.  No apical lung mass. IMPRESSION: Mixed plaque at the right common carotid bifurcation and proximal internal carotid. Stenosis measurement is difficult but likely greater than 70%. Minimal stenosis at the left ICA origin. Electronically Signed   By: Guadlupe Spanish M.D.   On: 05/22/2020 10:15    Assessment/Plan  Carotid stenosis She has undergone a CT angiogram of the neck which I have independently reviewed.  The official report says this was difficult to discern the degree of stenosis but was read out as greater than 70%.  I would agree that this was clearly greater than 70% and likely in the 80 to 85% range by my review on the right side.  The left side has more mild disease although calcific plaque was present.  It did not appear to be more than 50% to my review. We had a long discussion today about options for treatment.  We discussed the different surgery carotid endarterectomy and carotid stent placement.  Given her habitus and the anatomy of the lesion, I believe right carotid endarterectomy will be the less morbid procedure for her.  Her size makes either procedure somewhat morbid.  The patient has been described the risks and benefits of carotid endarterectomy in detail and is agreeable to proceed.  HTN (hypertension) blood pressure control important in reducing the progression of atherosclerotic disease. On appropriate oral medications.   Pain in limb Previously checked for PAD but her arterial perfusion was normal.  Musculoskeletal and neurogenic pain more likely.    Festus Barren, MD  05/29/2020 8:51 AM    This note was created with Dragon medical  transcription system.  Any errors from dictation are purely unintentional

## 2020-05-29 NOTE — Assessment & Plan Note (Signed)
blood pressure control important in reducing the progression of atherosclerotic disease. On appropriate oral medications.  

## 2020-05-29 NOTE — Assessment & Plan Note (Signed)
She has undergone a CT angiogram of the neck which I have independently reviewed.  The official report says this was difficult to discern the degree of stenosis but was read out as greater than 70%.  I would agree that this was clearly greater than 70% and likely in the 80 to 85% range by my review on the right side.  The left side has more mild disease although calcific plaque was present.  It did not appear to be more than 50% to my review. We had a long discussion today about options for treatment.  We discussed the different surgery carotid endarterectomy and carotid stent placement.  Given her habitus and the anatomy of the lesion, I believe right carotid endarterectomy will be the less morbid procedure for her.  Her size makes either procedure somewhat morbid.  The patient has been described the risks and benefits of carotid endarterectomy in detail and is agreeable to proceed.

## 2020-05-29 NOTE — Patient Instructions (Signed)
Carotid Endarterectomy A carotid endarterectomy is a surgery to remove a blockage in the carotid arteries. The carotid arteries are the large blood vessels on both sides of the neck that supply blood to the brain. Carotid artery disease, also called carotid artery stenosis, is the narrowing or blockage of one or both carotid arteries. Carotid artery disease is usually caused by atherosclerosis, which is a buildup of fat and plaque in the arteries. Some buildup of plaque normally occurs with aging. The plaque may partially or totally block blood flow or cause a clot to form in the carotid arteries. This may cause a stroke. Tell a health care provider about:  Any allergies you have.  All medicines you are taking, including vitamins, herbs, eye drops, creams, and over-the-counter medicines.  Any problems you or family members have had with anesthetic medicines.  Any blood disorders you have.  Any surgeries you have had.  Any medical conditions you have, or have had, including diabetes, kidney problems, and infections.  Whether you are pregnant or may be pregnant. What are the risks? Generally, this is a safe procedure. However, problems may occur, including:  Infection.  Bleeding.  Blood clots.  Allergic reactions to medicines.  Damage to nerves near the carotid arteries. This can cause a hoarse voice or weakness of muscles in your face.  Stroke.  Seizures.  Heart attack (myocardial infarction).  Narrowing of the opened blood vessel (restenosis). This may require another surgery. What happens before the procedure? Staying hydrated Follow instructions from your health care provider about hydration, which may include:  Up to 2 hours before the procedure - you may continue to drink clear liquids, such as water, clear fruit juice, black coffee, and plain tea.   Eating and drinking restrictions Follow instructions from your health care provider about eating and drinking, which may  include:  8 hours before the procedure - stop eating heavy meals or foods, such as meat, fried foods, or fatty foods.  6 hours before the procedure - stop eating light meals or foods, such as toast or cereal.  6 hours before the procedure - stop drinking milk or drinks that contain milk.  2 hours before the procedure - stop drinking clear liquids. Medicines  Ask your health care provider about: ? Changing or stopping your regular medicines. This is especially important if you are taking diabetes medicines or blood thinners. ? Taking medicines such as aspirin and ibuprofen. These medicines can thin your blood. Do not take these medicines unless your health care provider tells you to take them. ? Taking over-the-counter medicines, vitamins, herbs, and supplements. General instructions  Do not use any products that contain nicotine or tobacco for at least 4-6 weeks, or as soon as possible, before the procedure. These products include cigarettes, e-cigarettes, and chewing tobacco. If you need help quitting, ask your health care provider.  You may need to have blood tests, a test to check heart rhythm (electrocardiogram), or a test to check blood flow (angiogram).  Plan to have someone take you home from the hospital or clinic.  Ask your health care provider: ? How your surgery site will be marked. ? What steps will be taken to help prevent infection. These may include:  Removing hair at the surgery site.  Washing skin with a germ-killing soap.  Taking antibiotic medicine. What happens during the procedure?  An IV will be inserted into one of your veins.  You will be given one or more of the following: ?  A medicine to help you relax (sedative). ? A medicine to make you fall asleep (general anesthetic).  The surgeon will make a small incision in your neck to expose the carotid artery.  A tube may be inserted into the carotid artery above and below the blockage. This tube will  allow blood to flow around the blockage during the surgery.  An incision will be made in the carotid artery at the location of the blockage.  The blockage will be removed. In some cases, a section of the carotid artery may be removed and a graft patch may be used to repair the artery.  The carotid artery will be closed with stitches (sutures).  If a tube was inserted into the artery to allow blood flow around the blockage during surgery, the tube will be removed. Once the tube is removed, blood flow through the carotid artery will be restored.  The incision in the neck will be closed with sutures.  A bandage (dressing) will be placed over your incision. The procedure may vary among health care providers and hospitals.   What happens after the procedure?  Your blood pressure, heart rate, breathing rate, and blood oxygen level will be monitored until you leave the hospital or clinic.  You may have some pain or an ache in your neck for about 2 weeks. This is normal.  Do not drive for 24 hours if you were given a sedative during your procedure. Summary  A carotid endarterectomy is a surgery to remove a blockage in the carotid arteries.  The carotid arteries are the large blood vessels on both sides of the neck that supply blood to the brain.  Before the procedure, ask your health care provider about changing or stopping your regular medicines.  Follow instructions from your health care provider about eating and drinking before the procedure.  After the procedure, do not drive for 24 hours if you were given a sedative. This information is not intended to replace advice given to you by your health care provider. Make sure you discuss any questions you have with your health care provider. Document Revised: 10/26/2017 Document Reviewed: 10/26/2017 Elsevier Patient Education  2021 [employer].

## 2020-05-29 NOTE — Assessment & Plan Note (Signed)
Previously checked for PAD but her arterial perfusion was normal.  Musculoskeletal and neurogenic pain more likely.

## 2020-06-11 ENCOUNTER — Telehealth (INDEPENDENT_AMBULATORY_CARE_PROVIDER_SITE_OTHER): Payer: Self-pay

## 2020-06-11 NOTE — Telephone Encounter (Signed)
Spoke with the patient and she is scheduled with Dr. Wyn Quaker for a right carotid endarterectomy on 06/20/20 at [facility]. Pre-op phone call on 06/18/20 between 8-1 pm. Pre-surgical instructions were discussed and will be mailed to patient.

## 2020-06-14 ENCOUNTER — Other Ambulatory Visit (INDEPENDENT_AMBULATORY_CARE_PROVIDER_SITE_OTHER): Payer: Self-pay | Admitting: Nurse Practitioner

## 2020-06-18 ENCOUNTER — Other Ambulatory Visit
Admission: RE | Admit: 2020-06-18 | Discharge: 2020-06-18 | Disposition: A | Discharge status: Home / Self Care | Payer: Medicaid Other | Source: Ambulatory Visit | Attending: Vascular Surgery | Admitting: Vascular Surgery

## 2020-06-18 ENCOUNTER — Other Ambulatory Visit: Payer: Self-pay

## 2020-06-18 ENCOUNTER — Encounter: Payer: Self-pay | Admitting: Vascular Surgery

## 2020-06-18 DIAGNOSIS — Z20822 Contact with and (suspected) exposure to covid-19: Secondary | ICD-10-CM | POA: Insufficient documentation

## 2020-06-18 DIAGNOSIS — Z01818 Encounter for other preprocedural examination: Secondary | ICD-10-CM | POA: Insufficient documentation

## 2020-06-18 HISTORY — DX: Other complications of anesthesia, initial encounter: T88.59XA

## 2020-06-18 HISTORY — DX: Sleep apnea, unspecified: G47.30

## 2020-06-18 LAB — CBC WITH DIFFERENTIAL/PLATELET
Abs Immature Granulocytes: 0.02 10*3/uL (ref 0.00–0.07)
Basophils Absolute: 0 10*3/uL (ref 0.0–0.1)
Basophils Relative: 1 %
Eosinophils Absolute: 0.3 10*3/uL (ref 0.0–0.5)
Eosinophils Relative: 5 %
HCT: 35.1 % — ABNORMAL LOW (ref 36.0–46.0)
Hemoglobin: 11.4 g/dL — ABNORMAL LOW (ref 12.0–15.0)
Immature Granulocytes: 0 %
Lymphocytes Relative: 21 %
Lymphs Abs: 1.4 10*3/uL (ref 0.7–4.0)
MCH: 28.8 pg (ref 26.0–34.0)
MCHC: 32.5 g/dL (ref 30.0–36.0)
MCV: 88.6 fL (ref 80.0–100.0)
Monocytes Absolute: 0.5 10*3/uL (ref 0.1–1.0)
Monocytes Relative: 8 %
Neutro Abs: 4.4 10*3/uL (ref 1.7–7.7)
Neutrophils Relative %: 65 %
Platelets: 188 10*3/uL (ref 150–400)
RBC: 3.96 MIL/uL (ref 3.87–5.11)
RDW: 14 % (ref 11.5–15.5)
WBC: 6.7 10*3/uL (ref 4.0–10.5)
nRBC: 0 % (ref 0.0–0.2)

## 2020-06-18 LAB — BASIC METABOLIC PANEL
Anion gap: 7 (ref 5–15)
BUN: 13 mg/dL (ref 6–20)
CO2: 28 mmol/L (ref 22–32)
Calcium: 9 mg/dL (ref 8.9–10.3)
Chloride: 102 mmol/L (ref 98–111)
Creatinine, Ser: 1.02 mg/dL — ABNORMAL HIGH (ref 0.44–1.00)
GFR, Estimated: >60 mL/min (ref >60)
Glucose, Bld: 124 mg/dL — ABNORMAL HIGH (ref 70–99)
Potassium: 3.5 mmol/L (ref 3.5–5.1)
Sodium: 137 mmol/L (ref 135–145)

## 2020-06-18 LAB — PROTIME-INR
INR: 1 (ref 0.8–1.2)
Prothrombin Time: 13.3 seconds (ref 11.4–15.2)

## 2020-06-18 LAB — TYPE AND SCREEN
ABO/RH(D): O POS
Antibody Screen: NEGATIVE

## 2020-06-18 LAB — APTT: aPTT: 28 seconds (ref 24–36)

## 2020-06-18 LAB — SARS CORONAVIRUS 2 (TAT 6-24 HRS): SARS Coronavirus 2: NEGATIVE

## 2020-06-18 NOTE — Patient Instructions (Signed)
INSTRUCTIONS FOR SURGERY     Your surgery is scheduled for:   Thursday, April 21ST     To find out your arrival time for the day of surgery,          please call [phone] between 1 pm and 3 pm on :  Wednesday, April 20TH     When you arrive for surgery, report to the FIRST FLOOR OF THE MEDICAL MALL. WHEN      REGISTRATION PROCESS IS COMPLETED, YOU WILL CHECK IN AT THE SURIGAL      DESK ON THE SECOND FLOOR    REMEMBER: Instructions that are not followed completely may result in serious medical risk,  up to and including death, or upon the discretion of your surgeon and anesthesiologist,            your surgery may need to be rescheduled.  __X__ 1. Do not eat food after midnight the night before your procedure.                    No gum, candy, lozenger, tic tacs, tums or hard candies.                  ABSOLUTELY NOTHING SOLID IN YOUR MOUTH AFTER MIDNIGHT                    You may drink unlimited clear liquids up to 2 hours before you are scheduled to arrive for surgery.                   Do not drink anything within those 2 hours unless you need to take medicine, then take the                   smallest amount you need.  Clear liquids include:  water, apple juice without pulp,                   any flavor Gatorade, Black coffee, black tea.  Sugar may be added but no dairy/ honey /lemon.                        Broth and jello is not considered a clear liquid.  __x__  2. On the morning of surgery, please brush your teeth with toothpaste and water. You may rinse with                  mouthwash if you wish but DO NOT SWALLOW TOOTHPASTE OR MOUTHWASH  __X___3. NO alcohol for 24 hours before or after surgery.  __x___ 4.  Do NOT smoke or use e-cigarettes for 24 HOURS PRIOR TO SURGERY.                      DO NOT USe any chewable tobacco products for at least 6 hours prior to surgery.  __x___ 5. If you start any new medication after  this appointment and prior to surgery, please                   Bring it with you on the day of surgery.  ___x__ 6. Notify your doctor if there is any change in your medical condition, such as fever,                   infection, vomitting, diarrhea or any open sores.  __x___ 7.  USE the CHG SOAP as instructed, the night before surgery and the day of surgery.                   Once you have washed with this soap, do NOT use any of the following: Powders, perfumes                    or lotions. Please do not wear make up, hairpins, clips or nail polish. You may wear deodorant.                   Men may shave their face and neck.  Women need to shave 48 hours prior to surgery.                   DO NOT wear ANY jewelry on the day of surgery. If there are rings that are too tight to                    remove easily, please address this prior to the surgery day. Piercings need to be removed.                                                                     NO METAL ON YOUR BODY.                    Do NOT bring any valuables.  If you came to Pre-Admit testing then you will not need license,                     insurance card or credit card.  If you will be staying overnight, please either leave your things in                     the car or have your family be responsible for these items.                     [facility] IS NOT RESPONSIBLE FOR BELONGINGS OR VALUABLES.  ___X__ 8. DO NOT wear contact lenses on surgery day.  You may not have dentures,                     Hearing aides, contacts or glasses in the operating room. These items can be                    Placed in the Recovery Room to receive immediately after surgery.  ___x__ 10. Take the following medications on the morning of surgery with a sip of water:                              1. ALBUTERAL                     2. LIPITOR  3. WELLBUTRIN                     4. PROZAC                     5. SYNTHROID                      6. DULERA                     7. MEGACE                     8. SPIRIVA   __X__  12. CONTINUE TAKING PLAVIX PRIOR TO SURGERY.  DO NOT TAKE ON THE                      MORNING OF SURGERY.                       __x___ 13. STOP Anti-inflammatories as of NOW                      This includes IBUPROFEN / MOTRIN / ADVIL / ALEVE/ NAPROXYN                    YOU MAY TAKE TYLENOL ANY TIME PRIOR TO SURGERY.  ___X__ 14.  Stop supplements until after surgery.                     This includes: MULTIVITAMINS   __X___ 15. Bring your BIPAP machine into preop with you on the morning of surgery.  __X____17.  Continue to take the following medications but do not take on the morning of surgery:                           PLAVIX // LASIX // ALDACTONE // ENTRESTO   __X____18. If staying overnight, please have appropriate shoes to wear to be able to walk around the unit.                   Wear clean and comfortable clothing to the hospital.  BRING CELL PHONE AND A CHARGER. BRING PHONE NUMBERS FOR YOUR CONTACTS.  IF YOU COMPLETE THE MEDICAL DIRECTIVES, PLEASE BRING WITH YOU SO WE MAY COPY IT.

## 2020-06-18 NOTE — Progress Notes (Signed)
Perioperative Services Pre-Admission/Anesthesia Testing    Date: 06/19/20  Name: Kristie Dorsey MRN:   [id number]  Re: Review and clearance prior to carotid surgery   Case: [id number] Date/Time: 06/20/20 1330   Procedure: ENDARTERECTOMY CAROTID (Right )   Anesthesia type: General   Pre-op diagnosis: CAROTID ARTERY STENOSIS   Location: [facility] OR ROOM 09 / [facility] ORS FOR ANESTHESIA GROUP   Surgeons: Annice Needy, MD    Patient scheduled for the above procedure on 06/20/2020.  Of note, patient was previously reviewed by PAT APP prior to [facility] with Dr. Elesa Massed; see my note dated 02/02/2020.  Procedural notes from Dr. Elesa Massed reviewed; uncomplicated procedural/anesthetic course noted.  Interval health history reviewed and there have been no significant changes overall.  Patient previously cleared by vascular surgery, cardiology, and pulmonary medicine prior to procedure back in 01/2020.  With that being said, given the length of time that its been since initial clearances were issued, updated presurgical clearances were obtained as follows:   Per pulmonary medicine Jayme Cloud, MD), "this patient's respiratory problems are mostly related to obesity with obesity hypoventilation syndrome and OSA.  On the visit on 05/02/2020, she was as compensated as she was to get. She has severe cardiomyopathy with an EF of 30%. She needs cardiac preoperative risk assessment prior to proceeding.  Patient is at HIGH risk due to extreme obesity/cardiomyopathy. This risk cannot be further optimized".   Per cardiology Cliffton Asters, NP-C), "this patient is optimized for surgery and may proceed with the planned procedural course with a MODERATE risk stratification". Requested clearance for patient to HOLD her clopidogrel prior to this procedure. Cardiology under the impression that patient taking ASA 81 mg daily according to their records. However, in speaking with the patient, PAT RN x 2 and the [facility] pharmacy has confirmed that  patient is indeed taking clopidogrel and has not stopped this medication. We reached out to vascular surgeon's office to discuss; spoke with Manson Passey, NP-C who in turn spoke with Wyn Quaker, MD. Per surgeon, it is acceptable to proceed with planned procedure at this point. Patient should be contacted and advised NOT TO take any further doses of ASA and/or clopidogrel until directed to do so postoperatively by vascular surgery. Patient was contacted by PAT staff Mordecai Maes, RN) and advised of the aforementioned.   INTERVAL WORK UP:  CT CHEST LUNG CANCER SCREENING performed on 03/07/2020 1. Lung-RADS 2, benign appearance or behavior. Continue annual screening with low-dose chest CT without contrast in 12 months. 2. Trace dependent left pleural effusion. 3. Stable borderline mild cardiomegaly 4. Aortic atherosclerosis  5. Emphysema   CTA NECK WITH OR WITHOUT CONTRAST performed on 05/21/2020 1. Mixed plaque at the right common carotid bifurcation and proximal internal carotid.  2. Stenosis measurement is difficult but likely greater than 70%. 3. Minimal stenosis at the left ICA origin.  ASSESSMENT:  Labs: Reviewed and deemed appropriate for planned surgical intervention.  Hospital Outpatient Visit on 06/18/2020  Component Date Value Ref Range Status  . WBC 06/18/2020 6.7  4.0 - 10.5 K/uL Final  . RBC 06/18/2020 3.96  3.87 - 5.11 MIL/uL Final  . Hemoglobin 06/18/2020 11.4* 12.0 - 15.0 g/dL Final  . HCT 63/78/5885 35.1* 36.0 - 46.0 % Final  . MCV 06/18/2020 88.6  80.0 - 100.0 fL Final  . MCH 06/18/2020 28.8  26.0 - 34.0 pg Final  . MCHC 06/18/2020 32.5  30.0 - 36.0 g/dL Final  . RDW 02/77/4128 14.0  11.5 - 15.5 % Final  .  Platelets 06/18/2020 188  150 - 400 K/uL Final  . nRBC 06/18/2020 0.0  0.0 - 0.2 % Final  . Neutrophils Relative % 06/18/2020 65  % Final  . Neutro Abs 06/18/2020 4.4  1.7 - 7.7 K/uL Final  . Lymphocytes Relative 06/18/2020 21  % Final  . Lymphs Abs 06/18/2020 1.4  0.7 - 4.0 K/uL  Final  . Monocytes Relative 06/18/2020 8  % Final  . Monocytes Absolute 06/18/2020 0.5  0.1 - 1.0 K/uL Final  . Eosinophils Relative 06/18/2020 5  % Final  . Eosinophils Absolute 06/18/2020 0.3  0.0 - 0.5 K/uL Final  . Basophils Relative 06/18/2020 1  % Final  . Basophils Absolute 06/18/2020 0.0  0.0 - 0.1 K/uL Final  . Immature Granulocytes 06/18/2020 0  % Final  . Abs Immature Granulocytes 06/18/2020 0.02  0.00 - 0.07 K/uL Final   Performed at [facility], [address] [place], [place] [address]  . Sodium 06/18/2020 137  135 - 145 mmol/L Final  . Potassium 06/18/2020 3.5  3.5 - 5.1 mmol/L Final  . Chloride 06/18/2020 102  98 - 111 mmol/L Final  . CO2 06/18/2020 28  22 - 32 mmol/L Final  . Glucose, Bld 06/18/2020 124* 70 - 99 mg/dL Final   Glucose reference range applies only to samples taken after fasting for at least 8 hours.  . BUN 06/18/2020 13  6 - 20 mg/dL Final  . Creatinine, Ser 06/18/2020 1.02* 0.44 - 1.00 mg/dL Final  . Calcium 62/69/4854 9.0  8.9 - 10.3 mg/dL Final  . GFR, Estimated 06/18/2020 >60  >60 mL/min Final   Comment: (NOTE) Calculated using the CKD-EPI Creatinine Equation (2021)   . Anion gap 06/18/2020 7  5 - 15 Final   Performed at [facility], [address] [place] [place], [place] [address]  . Prothrombin Time 06/18/2020 13.3  11.4 - 15.2 seconds Final  . INR 06/18/2020 1.0  0.8 - 1.2 Final   Comment: (NOTE) INR goal varies based on device and disease states. Performed at [facility], [address], [place], [place] [address]   . aPTT 06/18/2020 28  24 - 36 seconds Final   Performed at [facility], [address] [place] [place], [place] [address]  . ABO/RH(D) 06/18/2020 O POS   Final  . Antibody Screen 06/18/2020 NEG   Final  . Sample Expiration 06/18/2020 07/02/2020,2359   Final  . Extend sample reason 06/18/2020    Final                   Value:NO TRANSFUSIONS OR PREGNANCY IN THE PAST 3 MONTHS Performed at [facility], [address], [place], [place] [address]   Hospital Outpatient Visit on 06/18/2020  Component Date Value Ref Range Status  . SARS Coronavirus 2 06/18/2020 NEGATIVE  NEGATIVE Final   Comment: (NOTE) SARS-CoV-2 target nucleic acids are NOT DETECTED.  The SARS-CoV-2 RNA is generally detectable in upper and lower respiratory specimens during the acute phase of infection. Negative results do not preclude SARS-CoV-2 infection, do not rule out co-infections with other pathogens, and should not be used as the sole basis for treatment or other patient management decisions. Negative results must be combined with clinical observations, patient history, and epidemiological information. The expected result is Negative.  Fact Sheet for Patients: HairSlick.no  Fact Sheet for Healthcare Providers: quierodirigir.com  This test is not yet approved or cleared by the [place] and  has been authorized  for detection and/or diagnosis of SARS-CoV-2 by FDA under an Emergency Use Authorization (EUA). This EUA will remain  in effect (meaning this test can be used) for the duration of the COVID-19 declaration under Se                          ction 564(b)(1) of the Act, 21 U.S.C. section 360bbb-3(b)(1), unless the authorization is terminated or revoked sooner.  Performed at [facility], [address] [place], [place] [address]     EKG: Date: 06/19/2020 Time ECG obtained: 1051 AM Rate: 63 bpm Rhythm: Sinus rhythm with occasional PVCs; LBBB Axis (leads I and aVF): Normal Intervals: PR 150 ms, QRS 180 ms. QTc 478 MS.  ST segment and T wave changes: No evidence of ST segment elevation or depression Comparison: Similar to previous tracing obtained on 02/26/2020  Impression and Plan:  Kristie Dorsey has been referred for pre-anesthesia review and clearance prior to her undergoing the planned anesthetic and procedural  courses. Available labs, pertinent testing, and imaging results were personally reviewed by me. This patient has been appropriately cleared by cardiology (MODERATE) and by pulmonary medicine (HIGH) with the indicated risks of significant perioperative cardiovascular/cardiopulmonary complications. Patient is as optimized as possible at this point, however it should be noted that patient is at an overall HIGH RISK for the planned procedure given her current clinical status.  Based on clinical review performed today (06/19/20), barring any significant acute changes in the patient's overall condition, it is anticipated that she will be able to proceed with the planned surgical intervention. Any acute changes in clinical condition may necessitate her procedure being postponed and/or cancelled. Patient will meet with anesthesia team (MD and/or CRNA) on this day of her procedure for preoperative evaluation/assessment.   Pre-surgical instructions were reviewed with the patient during her PAT appointment and questions were fielded by PAT clinical staff. Patient was advised that if any questions or concerns arise prior to her procedure then she should return a call to PAT and/or her surgeon's office to discuss.  Quentin Mulling, MSN, APRN, FNP-C, CEN [facility]  Peri-operative Services Nurse Practitioner Phone: [phone] 06/19/20 10:08 AM  NOTE: This note has been prepared using Dragon dictation software. Despite my best ability to proofread, there is always the potential that unintentional transcriptional errors may still occur from this process.

## 2020-06-18 NOTE — Pre-Procedure Instructions (Signed)
Contacted patient via phone to reconfirm about her blood thinning medications.  She denies being on any dose of Aspirin and is only taking Plavix.  She was not told to stop this prior to todays' visit.  When I spoke to her tonight, I told her to stop all blood thinning medications, ie Plavix and not to take again prior to surgery.  She understood this information.

## 2020-06-19 ENCOUNTER — Encounter: Payer: Self-pay | Admitting: Vascular Surgery

## 2020-06-20 ENCOUNTER — Inpatient Hospital Stay: Payer: Medicaid Other | Admitting: Urgent Care

## 2020-06-20 ENCOUNTER — Encounter
Admission: RE | Disposition: A | Discharge status: Home / Self Care | Payer: Self-pay | Source: Home / Self Care | Attending: Vascular Surgery

## 2020-06-20 ENCOUNTER — Other Ambulatory Visit: Payer: Self-pay

## 2020-06-20 ENCOUNTER — Encounter: Payer: Self-pay | Admitting: Vascular Surgery

## 2020-06-20 ENCOUNTER — Inpatient Hospital Stay
Admission: RE | Admit: 2020-06-20 | Discharge: 2020-06-21 | DRG: 038 | Disposition: A | Discharge status: Home / Self Care | Payer: Medicaid Other | Attending: Vascular Surgery | Admitting: Vascular Surgery

## 2020-06-20 ENCOUNTER — Ambulatory Visit: Payer: Medicaid Other | Admitting: Pulmonary Disease

## 2020-06-20 DIAGNOSIS — I700 Atherosclerosis of aorta: Secondary | ICD-10-CM | POA: Diagnosis present

## 2020-06-20 DIAGNOSIS — Z7902 Long term (current) use of antithrombotics/antiplatelets: Secondary | ICD-10-CM

## 2020-06-20 DIAGNOSIS — Z20822 Contact with and (suspected) exposure to covid-19: Secondary | ICD-10-CM | POA: Diagnosis present

## 2020-06-20 DIAGNOSIS — E039 Hypothyroidism, unspecified: Secondary | ICD-10-CM | POA: Diagnosis present

## 2020-06-20 DIAGNOSIS — Z79899 Other long term (current) drug therapy: Secondary | ICD-10-CM

## 2020-06-20 DIAGNOSIS — Z79890 Hormone replacement therapy (postmenopausal): Secondary | ICD-10-CM | POA: Diagnosis not present

## 2020-06-20 DIAGNOSIS — Z6841 Body Mass Index (BMI) 40.0 and over, adult: Secondary | ICD-10-CM

## 2020-06-20 DIAGNOSIS — Z952 Presence of prosthetic heart valve: Secondary | ICD-10-CM | POA: Diagnosis not present

## 2020-06-20 DIAGNOSIS — F17290 Nicotine dependence, other tobacco product, uncomplicated: Secondary | ICD-10-CM | POA: Diagnosis present

## 2020-06-20 DIAGNOSIS — I110 Hypertensive heart disease with heart failure: Secondary | ICD-10-CM | POA: Diagnosis present

## 2020-06-20 DIAGNOSIS — E662 Morbid (severe) obesity with alveolar hypoventilation: Secondary | ICD-10-CM | POA: Diagnosis present

## 2020-06-20 DIAGNOSIS — Z22322 Carrier or suspected carrier of Methicillin resistant Staphylococcus aureus: Secondary | ICD-10-CM

## 2020-06-20 DIAGNOSIS — J449 Chronic obstructive pulmonary disease, unspecified: Secondary | ICD-10-CM | POA: Diagnosis present

## 2020-06-20 DIAGNOSIS — I6521 Occlusion and stenosis of right carotid artery: Principal | ICD-10-CM | POA: Diagnosis present

## 2020-06-20 DIAGNOSIS — I509 Heart failure, unspecified: Secondary | ICD-10-CM | POA: Clinically undetermined

## 2020-06-20 DIAGNOSIS — Z7951 Long term (current) use of inhaled steroids: Secondary | ICD-10-CM | POA: Diagnosis not present

## 2020-06-20 HISTORY — DX: Obstructive sleep apnea (adult) (pediatric): G47.33

## 2020-06-20 HISTORY — DX: Carrier or suspected carrier of methicillin resistant Staphylococcus aureus: Z22.322

## 2020-06-20 HISTORY — DX: Morbid (severe) obesity with alveolar hypoventilation: E66.2

## 2020-06-20 HISTORY — DX: Cardiomyopathy, unspecified: I42.9

## 2020-06-20 HISTORY — PX: ENDARTERECTOMY: SHX5162

## 2020-06-20 HISTORY — DX: Left bundle-branch block, unspecified: I44.7

## 2020-06-20 HISTORY — DX: Atherosclerosis of aorta: I70.0

## 2020-06-20 LAB — MRSA PCR SCREENING: MRSA by PCR: POSITIVE — AB

## 2020-06-20 LAB — GLUCOSE, CAPILLARY: Glucose-Capillary: 143 mg/dL — ABNORMAL HIGH (ref 70–99)

## 2020-06-20 SURGERY — ENDARTERECTOMY, CAROTID
Anesthesia: General | Laterality: Right

## 2020-06-20 MED ORDER — ESMOLOL HCL 100 MG/10ML IV SOLN
INTRAVENOUS | Status: DC | PRN
  Administered 2020-06-20 (×3): 10 mg via INTRAVENOUS

## 2020-06-20 MED ORDER — LIDOCAINE HCL (CARDIAC) PF 100 MG/5ML IV SOSY
PREFILLED_SYRINGE | INTRAVENOUS | Status: DC | PRN
  Administered 2020-06-20: 100 mg via INTRAVENOUS

## 2020-06-20 MED ORDER — CHLORHEXIDINE GLUCONATE 0.12 % MT SOLN
15.0000 mL | Freq: Once | OROMUCOSAL | Status: AC
  Administered 2020-06-20: 15 mL via OROMUCOSAL

## 2020-06-20 MED ORDER — ACETAMINOPHEN 325 MG PO TABS
325.0000 mg | ORAL_TABLET | ORAL | Status: DC | PRN
  Administered 2020-06-21: 650 mg via ORAL
  Filled 2020-06-20: qty 2

## 2020-06-20 MED ORDER — LACTATED RINGERS IV SOLN: INTRAVENOUS | Status: DC

## 2020-06-20 MED ORDER — ONDANSETRON HCL 4 MG/2ML IJ SOLN
INTRAMUSCULAR | Status: AC
  Filled 2020-06-20: qty 4

## 2020-06-20 MED ORDER — SODIUM CHLORIDE 0.9 % IV SOLN
INTRAVENOUS | Status: DC | PRN
  Administered 2020-06-20: 25 ug/min via INTRAVENOUS

## 2020-06-20 MED ORDER — SUGAMMADEX SODIUM 500 MG/5ML IV SOLN
INTRAVENOUS | Status: AC
  Filled 2020-06-20: qty 5

## 2020-06-20 MED ORDER — FAMOTIDINE IN NACL 20-0.9 MG/50ML-% IV SOLN
20.0000 mg | Freq: Two times a day (BID) | INTRAVENOUS | Status: DC
  Administered 2020-06-20 – 2020-06-21 (×2): 20 mg via INTRAVENOUS
  Filled 2020-06-20 (×2): qty 50

## 2020-06-20 MED ORDER — ORAL CARE MOUTH RINSE: 15.0000 mL | Freq: Once | OROMUCOSAL | Status: AC

## 2020-06-20 MED ORDER — CHLORHEXIDINE GLUCONATE CLOTH 2 % EX PADS
6.0000 | MEDICATED_PAD | Freq: Once | CUTANEOUS | Status: AC
  Administered 2020-06-20: 6 via TOPICAL

## 2020-06-20 MED ORDER — ACETAMINOPHEN 10 MG/ML IV SOLN
INTRAVENOUS | Status: DC | PRN
  Administered 2020-06-20: 1000 mg via INTRAVENOUS

## 2020-06-20 MED ORDER — CHLORHEXIDINE GLUCONATE 0.12 % MT SOLN
OROMUCOSAL | Status: AC
  Filled 2020-06-20: qty 15

## 2020-06-20 MED ORDER — FENTANYL CITRATE (PF) 100 MCG/2ML IJ SOLN
INTRAMUSCULAR | Status: DC | PRN
  Administered 2020-06-20: 25 ug via INTRAVENOUS
  Administered 2020-06-20 (×3): 50 ug via INTRAVENOUS

## 2020-06-20 MED ORDER — PHENYLEPHRINE HCL (PRESSORS) 10 MG/ML IV SOLN
INTRAVENOUS | Status: DC | PRN
  Administered 2020-06-20 (×4): 100 ug via INTRAVENOUS

## 2020-06-20 MED ORDER — SODIUM CHLORIDE 0.9 % IV SOLN
INTRAVENOUS | Status: DC | PRN
  Administered 2020-06-20: 250 mL via INTRAMUSCULAR

## 2020-06-20 MED ORDER — SODIUM CHLORIDE 0.9 % IV SOLN: 500.0000 mL | Freq: Once | INTRAVENOUS | Status: DC | PRN

## 2020-06-20 MED ORDER — METOPROLOL TARTRATE 5 MG/5ML IV SOLN
2.0000 mg | INTRAVENOUS | Status: DC | PRN
Start: 2020-06-20 — End: 2020-06-21

## 2020-06-20 MED ORDER — TRAZODONE HCL 100 MG PO TABS
100.0000 mg | ORAL_TABLET | Freq: Every day | ORAL | Status: DC
  Administered 2020-06-20: 100 mg via ORAL
  Filled 2020-06-20: qty 1

## 2020-06-20 MED ORDER — NITROGLYCERIN IN D5W 200-5 MCG/ML-% IV SOLN: 5.0000 ug/min | INTRAVENOUS | Status: DC

## 2020-06-20 MED ORDER — LABETALOL HCL 5 MG/ML IV SOLN
10.0000 mg | INTRAVENOUS | Status: DC | PRN
  Filled 2020-06-20: qty 4

## 2020-06-20 MED ORDER — LIDOCAINE HCL 1 % IJ SOLN
INTRAMUSCULAR | Status: DC | PRN
  Administered 2020-06-20: 10 mL

## 2020-06-20 MED ORDER — FENTANYL CITRATE (PF) 100 MCG/2ML IJ SOLN
INTRAMUSCULAR | Status: AC
  Filled 2020-06-20: qty 2

## 2020-06-20 MED ORDER — LEVOTHYROXINE SODIUM 50 MCG PO TABS
75.0000 ug | ORAL_TABLET | Freq: Every day | ORAL | Status: DC
  Administered 2020-06-21: 75 ug via ORAL
  Filled 2020-06-20: qty 2

## 2020-06-20 MED ORDER — DEXMEDETOMIDINE (PRECEDEX) IN NS 20 MCG/5ML (4 MCG/ML) IV SYRINGE
PREFILLED_SYRINGE | INTRAVENOUS | Status: DC | PRN
Start: 2020-06-20 — End: 2020-06-20
  Administered 2020-06-20: 8 ug via INTRAVENOUS

## 2020-06-20 MED ORDER — SPIRONOLACTONE 25 MG PO TABS
25.0000 mg | ORAL_TABLET | Freq: Every day | ORAL | Status: DC
  Administered 2020-06-20 – 2020-06-21 (×2): 25 mg via ORAL
  Filled 2020-06-20 (×2): qty 1

## 2020-06-20 MED ORDER — ALBUTEROL SULFATE HFA 108 (90 BASE) MCG/ACT IN AERS
2.0000 | INHALATION_SPRAY | RESPIRATORY_TRACT | Status: DC | PRN
  Filled 2020-06-20: qty 6.7

## 2020-06-20 MED ORDER — MIDAZOLAM HCL 2 MG/2ML IJ SOLN
INTRAMUSCULAR | Status: AC
  Filled 2020-06-20: qty 2

## 2020-06-20 MED ORDER — OXYCODONE-ACETAMINOPHEN 5-325 MG PO TABS
1.0000 | ORAL_TABLET | ORAL | Status: DC | PRN
  Administered 2020-06-20: 1 via ORAL
  Filled 2020-06-20: qty 1

## 2020-06-20 MED ORDER — ONDANSETRON HCL 4 MG/2ML IJ SOLN
INTRAMUSCULAR | Status: DC | PRN
  Administered 2020-06-20 (×2): 4 mg via INTRAVENOUS

## 2020-06-20 MED ORDER — ASPIRIN EC 81 MG PO TBEC
81.0000 mg | DELAYED_RELEASE_TABLET | Freq: Every day | ORAL | Status: DC
  Administered 2020-06-21: 81 mg via ORAL
  Filled 2020-06-20: qty 1

## 2020-06-20 MED ORDER — SUCCINYLCHOLINE CHLORIDE 20 MG/ML IJ SOLN
INTRAMUSCULAR | Status: DC | PRN
  Administered 2020-06-20: 140 mg via INTRAVENOUS

## 2020-06-20 MED ORDER — HEPARIN SODIUM (PORCINE) 1000 UNIT/ML IJ SOLN
INTRAMUSCULAR | Status: DC | PRN
  Administered 2020-06-20: 7000 [IU] via INTRAVENOUS

## 2020-06-20 MED ORDER — FENTANYL CITRATE (PF) 100 MCG/2ML IJ SOLN
INTRAMUSCULAR | Status: AC
  Administered 2020-06-20: 25 ug via INTRAVENOUS
  Filled 2020-06-20: qty 2

## 2020-06-20 MED ORDER — GUAIFENESIN-DM 100-10 MG/5ML PO SYRP: 15.0000 mL | ORAL_SOLUTION | ORAL | Status: DC | PRN

## 2020-06-20 MED ORDER — FLUOXETINE HCL 20 MG PO CAPS
40.0000 mg | ORAL_CAPSULE | Freq: Every day | ORAL | Status: DC
  Administered 2020-06-21: 40 mg via ORAL
  Filled 2020-06-20: qty 2

## 2020-06-20 MED ORDER — HEMOSTATIC AGENTS (NO CHARGE) OPTIME
TOPICAL | Status: DC | PRN
  Administered 2020-06-20: 1 via TOPICAL

## 2020-06-20 MED ORDER — MOMETASONE FURO-FORMOTEROL FUM 200-5 MCG/ACT IN AERO
2.0000 | INHALATION_SPRAY | Freq: Two times a day (BID) | RESPIRATORY_TRACT | Status: DC
  Administered 2020-06-20 – 2020-06-21 (×2): 2 via RESPIRATORY_TRACT
  Filled 2020-06-20: qty 8.8

## 2020-06-20 MED ORDER — CEFAZOLIN SODIUM-DEXTROSE 2-4 GM/100ML-% IV SOLN
2.0000 g | Freq: Three times a day (TID) | INTRAVENOUS | Status: AC
  Administered 2020-06-20 – 2020-06-21 (×2): 2 g via INTRAVENOUS
  Filled 2020-06-20 (×2): qty 100

## 2020-06-20 MED ORDER — ALUM & MAG HYDROXIDE-SIMETH 200-200-20 MG/5ML PO SUSP: 15.0000 mL | ORAL | Status: DC | PRN

## 2020-06-20 MED ORDER — CEFAZOLIN SODIUM-DEXTROSE 2-4 GM/100ML-% IV SOLN
INTRAVENOUS | Status: AC
  Filled 2020-06-20: qty 100

## 2020-06-20 MED ORDER — ATORVASTATIN CALCIUM 10 MG PO TABS
10.0000 mg | ORAL_TABLET | Freq: Every day | ORAL | Status: DC
  Administered 2020-06-21: 10 mg via ORAL
  Filled 2020-06-20: qty 1

## 2020-06-20 MED ORDER — ACETAMINOPHEN 10 MG/ML IV SOLN
INTRAVENOUS | Status: AC
  Filled 2020-06-20: qty 100

## 2020-06-20 MED ORDER — ONDANSETRON HCL 4 MG/2ML IJ SOLN: 4.0000 mg | Freq: Four times a day (QID) | INTRAMUSCULAR | Status: DC | PRN

## 2020-06-20 MED ORDER — CEFAZOLIN SODIUM-DEXTROSE 2-4 GM/100ML-% IV SOLN
2.0000 g | INTRAVENOUS | Status: AC
  Administered 2020-06-20: 1 g via INTRAVENOUS
  Administered 2020-06-20: 2 g via INTRAVENOUS

## 2020-06-20 MED ORDER — MAGNESIUM SULFATE 2 GM/50ML IV SOLN: 2.0000 g | Freq: Every day | INTRAVENOUS | Status: DC | PRN

## 2020-06-20 MED ORDER — FAMOTIDINE 20 MG PO TABS: 20.0000 mg | ORAL_TABLET | Freq: Once | ORAL | Status: AC

## 2020-06-20 MED ORDER — FENTANYL CITRATE (PF) 100 MCG/2ML IJ SOLN: 25.0000 ug | INTRAMUSCULAR | Status: DC | PRN

## 2020-06-20 MED ORDER — DOCUSATE SODIUM 100 MG PO CAPS
100.0000 mg | ORAL_CAPSULE | Freq: Every day | ORAL | Status: DC
  Administered 2020-06-21: 100 mg via ORAL
  Filled 2020-06-20: qty 1

## 2020-06-20 MED ORDER — ESMOLOL HCL-SODIUM CHLORIDE 2000 MG/100ML IV SOLN
25.0000 ug/kg/min | INTRAVENOUS | Status: DC
  Filled 2020-06-20: qty 100

## 2020-06-20 MED ORDER — FAMOTIDINE 20 MG PO TABS
ORAL_TABLET | ORAL | Status: AC
  Administered 2020-06-20: 20 mg via ORAL
  Filled 2020-06-20: qty 1

## 2020-06-20 MED ORDER — TIOTROPIUM BROMIDE MONOHYDRATE 18 MCG IN CAPS
18.0000 ug | ORAL_CAPSULE | Freq: Every day | RESPIRATORY_TRACT | Status: DC
  Administered 2020-06-21: 18 ug via RESPIRATORY_TRACT
  Filled 2020-06-20: qty 5

## 2020-06-20 MED ORDER — LACTATED RINGERS IV SOLN: INTRAVENOUS | Status: DC | PRN

## 2020-06-20 MED ORDER — BUPROPION HCL ER (SR) 150 MG PO TB12
150.0000 mg | ORAL_TABLET | Freq: Two times a day (BID) | ORAL | Status: DC
  Administered 2020-06-20 – 2020-06-21 (×2): 150 mg via ORAL
  Filled 2020-06-20 (×3): qty 1

## 2020-06-20 MED ORDER — POTASSIUM CHLORIDE CRYS ER 20 MEQ PO TBCR: 20.0000 meq | EXTENDED_RELEASE_TABLET | Freq: Every day | ORAL | Status: DC | PRN

## 2020-06-20 MED ORDER — PROPOFOL 10 MG/ML IV BOLUS
INTRAVENOUS | Status: DC | PRN
  Administered 2020-06-20: 20 mg via INTRAVENOUS
  Administered 2020-06-20: 30 mg via INTRAVENOUS
  Administered 2020-06-20: 20 mg via INTRAVENOUS

## 2020-06-20 MED ORDER — ROCURONIUM BROMIDE 100 MG/10ML IV SOLN
INTRAVENOUS | Status: DC | PRN
  Administered 2020-06-20: 10 mg via INTRAVENOUS
  Administered 2020-06-20: 30 mg via INTRAVENOUS
  Administered 2020-06-20: 20 mg via INTRAVENOUS
  Administered 2020-06-20: 30 mg via INTRAVENOUS
  Administered 2020-06-20: 10 mg via INTRAVENOUS

## 2020-06-20 MED ORDER — ETOMIDATE 2 MG/ML IV SOLN
INTRAVENOUS | Status: DC | PRN
  Administered 2020-06-20: 20 mg via INTRAVENOUS

## 2020-06-20 MED ORDER — SEVOFLURANE IN SOLN
RESPIRATORY_TRACT | Status: AC
  Filled 2020-06-20: qty 250

## 2020-06-20 MED ORDER — SODIUM CHLORIDE 0.9 % IV SOLN: INTRAVENOUS | Status: DC

## 2020-06-20 MED ORDER — PHENOL 1.4 % MT LIQD
1.0000 | OROMUCOSAL | Status: DC | PRN
  Filled 2020-06-20: qty 177

## 2020-06-20 MED ORDER — MIDAZOLAM HCL 2 MG/2ML IJ SOLN
INTRAMUSCULAR | Status: DC | PRN
  Administered 2020-06-20: 1 mg via INTRAVENOUS

## 2020-06-20 MED ORDER — "VISTASEAL 4 ML SINGLE DOSE KIT "
PACK | CUTANEOUS | Status: DC | PRN
  Administered 2020-06-20: 4 mL via TOPICAL

## 2020-06-20 MED ORDER — ONDANSETRON HCL 4 MG/2ML IJ SOLN: 4.0000 mg | Freq: Once | INTRAMUSCULAR | Status: DC | PRN

## 2020-06-20 MED ORDER — HYDRALAZINE HCL 20 MG/ML IJ SOLN
5.0000 mg | INTRAMUSCULAR | Status: DC | PRN
  Filled 2020-06-20: qty 0.25

## 2020-06-20 MED ORDER — CLOPIDOGREL BISULFATE 75 MG PO TABS
75.0000 mg | ORAL_TABLET | Freq: Every day | ORAL | Status: DC
  Administered 2020-06-20 – 2020-06-21 (×2): 75 mg via ORAL
  Filled 2020-06-20 (×2): qty 1

## 2020-06-20 MED ORDER — DEXAMETHASONE SODIUM PHOSPHATE 10 MG/ML IJ SOLN
INTRAMUSCULAR | Status: DC | PRN
  Administered 2020-06-20: 10 mg via INTRAVENOUS

## 2020-06-20 MED ORDER — MORPHINE SULFATE (PF) 2 MG/ML IV SOLN: 2.0000 mg | INTRAVENOUS | Status: DC | PRN

## 2020-06-20 MED ORDER — ACETAMINOPHEN 650 MG RE SUPP: 325.0000 mg | RECTAL | Status: DC | PRN

## 2020-06-20 MED ORDER — ADULT MULTIVITAMIN W/MINERALS CH
1.0000 | ORAL_TABLET | Freq: Every day | ORAL | Status: DC
  Administered 2020-06-20 – 2020-06-21 (×2): 1 via ORAL
  Filled 2020-06-20 (×2): qty 1

## 2020-06-20 MED ORDER — NITROGLYCERIN IN D5W 200-5 MCG/ML-% IV SOLN
INTRAVENOUS | Status: AC
  Filled 2020-06-20: qty 250

## 2020-06-20 MED ORDER — SUGAMMADEX SODIUM 200 MG/2ML IV SOLN
INTRAVENOUS | Status: DC | PRN
  Administered 2020-06-20: 300 mg via INTRAVENOUS

## 2020-06-20 MED ORDER — FUROSEMIDE 20 MG PO TABS
40.0000 mg | ORAL_TABLET | Freq: Every day | ORAL | Status: DC
  Administered 2020-06-20 – 2020-06-21 (×2): 40 mg via ORAL
  Filled 2020-06-20 (×2): qty 2

## 2020-06-20 SURGICAL SUPPLY — 68 items
"PENCIL ELECTRO HAND CTR " (MISCELLANEOUS) IMPLANT
ADH SKN CLS APL DERMABOND .7 (GAUZE/BANDAGES/DRESSINGS) ×2
APL PRP STRL LF DISP 70% ISPRP (MISCELLANEOUS) ×1
BAG DECANTER FOR FLEXI CONT (MISCELLANEOUS) ×2 IMPLANT
BLADE SURG 15 STRL LF DISP TIS (BLADE) ×1 IMPLANT
BLADE SURG 15 STRL SS (BLADE) ×2
BLADE SURG SZ11 CARB STEEL (BLADE) ×2 IMPLANT
BOOT SUTURE AID YELLOW STND (SUTURE) ×2 IMPLANT
BRUSH SCRUB EZ  4% CHG (MISCELLANEOUS) ×2
BRUSH SCRUB EZ 4% CHG (MISCELLANEOUS) ×1 IMPLANT
CANISTER SUCT 1200ML W/VALVE (MISCELLANEOUS) ×2 IMPLANT
CHLORAPREP W/TINT 26 (MISCELLANEOUS) ×2 IMPLANT
COVER WAND RF STERILE (DRAPES) ×2 IMPLANT
DERMABOND ADVANCED (GAUZE/BANDAGES/DRESSINGS) ×2
DERMABOND ADVANCED .7 DNX12 (GAUZE/BANDAGES/DRESSINGS) ×1 IMPLANT
DRAPE 3/4 80X56 (DRAPES) ×2 IMPLANT
DRAPE INCISE IOBAN 66X45 STRL (DRAPES) ×2 IMPLANT
DRAPE LAPAROTOMY 77X122 PED (DRAPES) ×2 IMPLANT
ELECT CAUTERY BLADE 6.4 (BLADE) ×2 IMPLANT
ELECT REM PT RETURN 9FT ADLT (ELECTROSURGICAL) ×2
ELECTRODE REM PT RTRN 9FT ADLT (ELECTROSURGICAL) ×1 IMPLANT
GLOVE SURG ENC MOIS LTX SZ7 (GLOVE) ×2 IMPLANT
GLOVE SURG SYN 7.0 (GLOVE) ×8 IMPLANT
GLOVE SURG SYN 7.0 PF PI (GLOVE) ×2 IMPLANT
GLOVE SURG UNDER LTX SZ7.5 (GLOVE) ×5 IMPLANT
GOWN STRL REUS W/ TWL LRG LVL3 (GOWN DISPOSABLE) ×2 IMPLANT
GOWN STRL REUS W/ TWL XL LVL3 (GOWN DISPOSABLE) ×3 IMPLANT
GOWN STRL REUS W/TWL LRG LVL3 (GOWN DISPOSABLE) ×4
GOWN STRL REUS W/TWL XL LVL3 (GOWN DISPOSABLE) ×6
HEMOSTAT SURGICEL 2X3 (HEMOSTASIS) ×2 IMPLANT
IV NS 250ML (IV SOLUTION) ×2
IV NS 250ML BAXH (IV SOLUTION) ×1 IMPLANT
KIT TURNOVER KIT A (KITS) ×2 IMPLANT
LABEL OR SOLS (LABEL) ×2 IMPLANT
LOOP RED MAXI  1X406MM (MISCELLANEOUS) ×2
LOOP VESSEL MAXI  1X406 RED (MISCELLANEOUS) ×2
LOOP VESSEL MAXI 1X406 RED (MISCELLANEOUS) ×2 IMPLANT
LOOP VESSEL MINI 0.8X406 BLUE (MISCELLANEOUS) ×1 IMPLANT
LOOPS BLUE MINI 0.8X406MM (MISCELLANEOUS) ×1
MANIFOLD NEPTUNE II (INSTRUMENTS) ×2 IMPLANT
NDL FILTER BLUNT 18X1 1/2 (NEEDLE) ×1 IMPLANT
NDL HYPO 25X1 1.5 SAFETY (NEEDLE) ×1 IMPLANT
NEEDLE FILTER BLUNT 18X 1/2SAF (NEEDLE) ×1
NEEDLE FILTER BLUNT 18X1 1/2 (NEEDLE) ×1 IMPLANT
NEEDLE HYPO 25X1 1.5 SAFETY (NEEDLE) ×2 IMPLANT
NS IRRIG 500ML POUR BTL (IV SOLUTION) ×2 IMPLANT
PACK BASIN MAJOR ARMC (MISCELLANEOUS) ×2 IMPLANT
PATCH CAROTID ECM VASC 1X10 (Prosthesis & Implant Heart) ×2 IMPLANT
PENCIL ELECTRO HAND CTR (MISCELLANEOUS) IMPLANT
SET WALTER ACTIVATION W/DRAPE (SET/KITS/TRAYS/PACK) ×1 IMPLANT
SHUNT W TPORT 9FR PRUITT F3 (SHUNT) ×2 IMPLANT
SUT MNCRL 4-0 (SUTURE) ×2
SUT MNCRL 4-0 27XMFL (SUTURE) ×1
SUT PROLENE 6 0 BV (SUTURE) ×10 IMPLANT
SUT PROLENE 7 0 BV 1 (SUTURE) ×4 IMPLANT
SUT SILK 2 0 (SUTURE) ×2
SUT SILK 2-0 18XBRD TIE 12 (SUTURE) ×1 IMPLANT
SUT SILK 3 0 (SUTURE) ×2
SUT SILK 3-0 18XBRD TIE 12 (SUTURE) ×1 IMPLANT
SUT SILK 4 0 (SUTURE) ×2
SUT SILK 4-0 18XBRD TIE 12 (SUTURE) ×1 IMPLANT
SUT VIC AB 3-0 SH 27 (SUTURE) ×4
SUT VIC AB 3-0 SH 27X BRD (SUTURE) ×2 IMPLANT
SUTURE MNCRL 4-0 27XMF (SUTURE) ×1 IMPLANT
SYR 10ML LL (SYRINGE) ×4 IMPLANT
SYR 20ML LL LF (SYRINGE) ×2 IMPLANT
TRAY FOLEY MTR SLVR 16FR STAT (SET/KITS/TRAYS/PACK) ×2 IMPLANT
TUBING CONNECTING 10 (TUBING) IMPLANT

## 2020-06-20 NOTE — Transfer of Care (Signed)
Immediate Anesthesia Transfer of Care Note  Patient: [name]  Procedure(s) Performed: ENDARTERECTOMY CAROTID (Right )  Patient Location: PACU  Anesthesia Type:General  Level of Consciousness: awake and patient cooperative  Airway & Oxygen Therapy: Patient Spontanous Breathing and Patient connected to face mask oxygen  Post-op Assessment: Report given to RN and Post -op Vital signs reviewed and stable  Post vital signs: Reviewed and stable  Last Vitals:  Vitals Value Taken Time  BP 149/60 06/20/20 1632  Temp    Pulse 68 06/20/20 1638  Resp 20 06/20/20 1638  SpO2 98 % 06/20/20 1638  Vitals shown include unvalidated device data.  Last Pain:  Vitals:   06/20/20 1159  TempSrc: Temporal  PainSc: 6          Complications: No complications documented.

## 2020-06-20 NOTE — Anesthesia Preprocedure Evaluation (Addendum)
Anesthesia Evaluation  Patient identified by MRN, date of birth, ID band Patient awake    Reviewed: Allergy & Precautions, NPO status , Patient's Chart, lab work & pertinent test results  History of Anesthesia Complications (+) PROLONGED EMERGENCE and history of anesthetic complications  Airway Mallampati: III  TM Distance: <3 FB     Dental   Pulmonary shortness of breath and with exertion, sleep apnea , COPD, Patient abstained from smoking., former smoker,    Pulmonary exam normal        Cardiovascular hypertension, + angina + Peripheral Vascular Disease and +CHF  Normal cardiovascular exam+ Valvular Problems/Murmurs AS      Neuro/Psych PSYCHIATRIC DISORDERS Depression    GI/Hepatic Neg liver ROS,   Endo/Other  Hypothyroidism Morbid obesity  Renal/GU negative Renal ROS  Female GU complaint     Musculoskeletal  (+) Arthritis , Osteoarthritis,    Abdominal (+) + obese,   Peds negative pediatric ROS (+)  Hematology   Anesthesia Other Findings Past Medical History: No date: Anginal pain (HCC)     Comment:  every once in a while No date: Aortic atherosclerosis (HCC) No date: Arthritis No date: Cardiomyopathy (HCC) No date: Carotid stenosis No date: CHF (congestive heart failure) (HCC)     Comment:  probable, takes HCTZ as a "fluid pill" No date: Complication of anesthesia     Comment:  difficulty waking up after AVR No date: COPD (chronic obstructive pulmonary disease) (HCC)     Comment:  probable, no specific diagnosis No date: Depression     Comment:  take fluoxetine No date: Dyspnea No date: Hypertension No date: Hypothyroidism No date: LBBB (left bundle branch block) No date: Morbid obesity (HCC) No date: Nonrheumatic aortic (valve) stenosis     Comment:  s/p TAVR No date: Obesity hypoventilation syndrome (HCC) No date: OSA treated with BiPAP No date: Thyroid disease     Comment:   hypothryoidism EF30%  Reproductive/Obstetrics                            Anesthesia Physical  Anesthesia Plan  ASA: IV  Anesthesia Plan: General   Post-op Pain Management:    Induction: Intravenous, Rapid sequence and Cricoid pressure planned  PONV Risk Score and Plan:   Airway Management Planned: Oral ETT and Video Laryngoscope Planned  Additional Equipment: Arterial line  Intra-op Plan:   Post-operative Plan: Extubation in OR and Possible Post-op intubation/ventilation  Informed Consent: I have reviewed the patients History and Physical, chart, labs and discussed the procedure including the risks, benefits and alternatives for the proposed anesthesia with the patient or authorized representative who has indicated his/her understanding and acceptance.     Dental advisory given  Plan Discussed with: CRNA and Surgeon  Anesthesia Plan Comments: (Talked with patient extensively about her increaded risks of anesthesia.  Patient appears to understand and agrees with going ahead with GOT to control airway for the case.  She also understands that the Ett may stay past conclusion of the case if she is weak.)        Anesthesia Quick Evaluation

## 2020-06-20 NOTE — Progress Notes (Signed)
Dr. Wyn Quaker notified of swelling to the right neck near pt's ear. Swelling marked. Area soft to touch. Dr. Wyn Quaker Acknowledged. No new orders at this time.

## 2020-06-20 NOTE — Progress Notes (Signed)
Nursing handoff received from [place], [place].

## 2020-06-20 NOTE — Anesthesia Procedure Notes (Signed)
Arterial Line Insertion Start/End4/21/2022 2:20 PM Performed by: Yves Dill, MD, CRNA  Patient location: OR. Preanesthetic checklist: patient identified, IV checked, site marked, risks and benefits discussed, surgical consent, monitors and equipment checked, pre-op evaluation, timeout performed and anesthesia consent Patient sedated Left, radial was placed Catheter size: 20 G Hand hygiene performed , maximum sterile barriers used  and Seldinger technique used Allen's test indicative of satisfactory collateral circulation Attempts: 1 Procedure performed using ultrasound guided technique. Ultrasound Notes:anatomy identified, needle tip was noted to be adjacent to the nerve/plexus identified and no ultrasound evidence of intravascular and/or intraneural injection Following insertion, dressing applied. Post procedure assessment: normal

## 2020-06-20 NOTE — Op Note (Signed)
[facility]   OPERATIVE NOTE  PROCEDURE:   1.  Right carotid endarterectomy with CorMatrix arterial patch reconstruction  PRE-OPERATIVE DIAGNOSIS: 1.  High grade right carotid stenosis  POST-OPERATIVE DIAGNOSIS: same as above   SURGEON: Festus Barren, MD  ASSISTANT(S): Raul Del, PA-C  ANESTHESIA: general  ESTIMATED BLOOD LOSS: 25 cc  FINDING(S): 1.  right carotid plaque.  SPECIMEN(S):  Carotid plaque (sent to Pathology)  INDICATIONS:   Kristie Dorsey is a 60 y.o. female who presents with right carotid stenosis of >80%.  I discussed with the patient the risks, benefits, and alternatives to carotid endarterectomy.  I discussed the differences between carotid stenting and carotid endarterectomy. I discussed the procedural details of carotid endarterectomy with the patient.  The patient is aware that the risks of carotid endarterectomy include but are not limited to: bleeding, infection, stroke, myocardial infarction, death, cranial nerve injuries both temporary and permanent, neck hematoma, possible airway compromise, labile blood pressure post-operatively, cerebral hyperperfusion syndrome, and possible need for additional interventions in the future. The patient is aware of the risks and agrees to proceed forward with the procedure. An assistant was present during the procedure to help facilitate the exposure and expedite the procedure.  DESCRIPTION: After full informed written consent was obtained from the patient, the patient was brought back to the operating room and placed supine upon the operating table.  Prior to induction, the patient received IV antibiotics.  After obtaining adequate anesthesia, the patient was placed into a modified beach chair position with a shoulder roll in place and the patient's neck slightly hyperextended and rotated away from the surgical site.  Her size made positioning challenging, but care was taken to avoid any injury to the  patient and to expose her neck as best possible. The assistant provided retraction and mobilization to help facilitate exposure and expedite the procedure throughout the entire procedure.  This included following suture, using retractors, and optimizing lighting.  The patient was prepped in the standard fashion for a carotid endarterectomy.  I made an incision anterior to the sternocleidomastoid muscle and dissected down through the subcutaneous tissue.  The platysmas was opened with electrocautery.  Then I dissected down to the internal jugular vein and facial vein.  The facial vein is ligated and divided between 2-0 silk ties.  This was dissected posteriorly until I obtained visualization of the common carotid artery.  This was dissected out and then a vessel loop was placed around the common carotid artery.  I then dissected in a periadventitial fashion along the common carotid artery up to the bifurcation.  I then identified the external carotid artery and the superior thyroid artery.  I placed a vessel loop around the superior thyroid artery, and I also dissected out the external carotid artery and placed a vessel loop around it.  The external carotid artery was very small and it was known to be occluded from her preoperative CT scan.  For mobilization I ligated the superior thyroid artery between silk ties.  In the process of this dissection, the hypoglossal nerve was identified and protected from harm.  I then dissected out the internal carotid artery until I identified an area in the internal carotid artery clearly above the stenosis.  I dissected slightly distal to this area, and placed a vessel loop around the artery.  At this point, we gave the patient 7000 units of intravenous heparin.  After this was allowed to circulate for several minutes, I pulled up  control on the vessel loops to clamp the internal carotid artery, external carotid artery, superior thyroid artery, and then the common carotid artery.   I then made an arteriotomy in the common carotid artery with a 11 blade, and extended the arteriotomy with a Potts scissor down into the common carotid artery, then I carried the arteriotomy through the bifurcation into the internal carotid artery until I reached an area that was not diseased.  At this point, I took the [place] shunt that previously been prepared and I inserted it into the internal carotid artery first, and then into the common carotid artery taking care to flush and de-air prior to release of control. At this point, I started the endarterectomy in the common carotid artery with a [place] elevator and carried this dissection down into the common carotid artery circumferentially.  Then I transected the plaque at a segment where it was adherent and transected the plaque with Potts scissors.  I then carried this dissection up into the external carotid artery.  The plaque was extracted by unclamping the external carotid artery and performing an eversion endarterectomy.  The dissection was then carried into the internal carotid artery where a nice feathered end point was created with gentle traction.  I passed the plaque off the field as a specimen. At this point I removed all loose flecks and remaining disease possible.  At this point, I was satisfied that the minimal remaining disease was densely adherent to the wall and wall integrity was intact. The distal endpoint was clean.  I then fashioned a CorMatrix arterial patch for the artery and sewed it in place with two running stitch of 6-0 Prolene.  I started at the distal endpoint and ran one half the length of the arteriotomy.  I then cut and beveled the patch to an appropriate length to match the arteriotomy.  I started the second 6-0 Prolene at the proximal end point.  The medial suture line was completed and the lateral suture line was run approximately one quarter the length of the arteriotomy.  Prior to completing this patch angioplasty,  I removed the shunt first from the internal carotid artery, from which there was excellent backbleeding, and clamped it.  Then I removed the shunt from the common carotid artery, from which there was excellent antegrade bleeding, and then clamped it.  At this point, I allowed the external carotid artery to backbleed, which was excellent.  Then I instilled heparinized saline in this patched artery and then completed the patch angioplasty in the usual fashion.  First, I released the clamp on the external carotid artery, then I released it on the common carotid artery.  After waiting a few seconds, I then released it on the internal carotid artery. Several minutes of pressure were held and 6-0 Prolene patch sutures were used as need for hemostasis.  At this point, I placed Surgicel and Evicel topical hemostatic agents.  There was no more active bleeding in the surgical site.  The sternocleidomastoid space was closed with three interrupted 3-0 Vicryl sutures. I then reapproximated the platysma muscle with a running stitch of 3-0 Vicryl.  The skin was then closed with a running subcuticular 4-0 Monocryl.  The skin was then cleaned, dried and Dermabond was used to reinforce the skin closure.  The patient awakened and was taken to the recovery room in stable condition, following commands and moving all four extremities without any apparent deficits.    COMPLICATIONS: none  CONDITION: stable  Festus Barren  06/20/2020, 4:10 PM    This note was created with Dragon Medical transcription system. Any errors in dictation are purely unintentional.

## 2020-06-20 NOTE — Anesthesia Procedure Notes (Signed)
Procedure Name: Intubation Performed by: Fletcher-Harrison, [place], CRNA Pre-anesthesia Checklist: Patient identified, Emergency Drugs available, Suction available and Patient being monitored Patient Re-evaluated:Patient Re-evaluated prior to induction Oxygen Delivery Method: Circle system utilized Preoxygenation: Pre-oxygenation with 100% oxygen Induction Type: IV induction Ventilation: Mask ventilation without difficulty Laryngoscope Size: McGraph and 3 Grade View: Grade I Tube type: Oral Tube size: 7.5 mm Number of attempts: 1 Airway Equipment and Method: Stylet and Oral airway Placement Confirmation: ETT inserted through vocal cords under direct vision,  positive ETCO2,  breath sounds checked- equal and bilateral and CO2 detector Secured at: 21 cm Tube secured with: Tape Dental Injury: Teeth and Oropharynx as per pre-operative assessment        

## 2020-06-20 NOTE — Plan of Care (Signed)

## 2020-06-20 NOTE — Interval H&P Note (Signed)
History and Physical Interval Note:  06/20/2020 12:13 PM  Kristie Dorsey  has presented today for surgery, with the diagnosis of CAROTID ARTERY STENOSIS.  The various methods of treatment have been discussed with the patient and family. After consideration of risks, benefits and other options for treatment, the patient has consented to  Procedure(s): ENDARTERECTOMY CAROTID (Right) as a surgical intervention.  The patient's history has been reviewed, patient examined, no change in status, stable for surgery.  I have reviewed the patient's chart and labs.  Questions were answered to the patient's satisfaction.     Festus Barren

## 2020-06-21 ENCOUNTER — Encounter: Payer: Self-pay | Admitting: Vascular Surgery

## 2020-06-21 DIAGNOSIS — I6521 Occlusion and stenosis of right carotid artery: Principal | ICD-10-CM

## 2020-06-21 LAB — CBC
HCT: 35.7 % — ABNORMAL LOW (ref 36.0–46.0)
Hemoglobin: 11.6 g/dL — ABNORMAL LOW (ref 12.0–15.0)
MCH: 28.6 pg (ref 26.0–34.0)
MCHC: 32.5 g/dL (ref 30.0–36.0)
MCV: 88.1 fL (ref 80.0–100.0)
Platelets: 185 10*3/uL (ref 150–400)
RBC: 4.05 MIL/uL (ref 3.87–5.11)
RDW: 13.9 % (ref 11.5–15.5)
WBC: 8.6 10*3/uL (ref 4.0–10.5)
nRBC: 0 % (ref 0.0–0.2)

## 2020-06-21 LAB — BASIC METABOLIC PANEL
Anion gap: 8 (ref 5–15)
BUN: 12 mg/dL (ref 6–20)
CO2: 25 mmol/L (ref 22–32)
Calcium: 8.8 mg/dL — ABNORMAL LOW (ref 8.9–10.3)
Chloride: 107 mmol/L (ref 98–111)
Creatinine, Ser: 1.06 mg/dL — ABNORMAL HIGH (ref 0.44–1.00)
GFR, Estimated: >60 mL/min (ref >60)
Glucose, Bld: 160 mg/dL — ABNORMAL HIGH (ref 70–99)
Potassium: 4.4 mmol/L (ref 3.5–5.1)
Sodium: 140 mmol/L (ref 135–145)

## 2020-06-21 MED ORDER — CHLORHEXIDINE GLUCONATE CLOTH 2 % EX PADS: 6.0000 | MEDICATED_PAD | Freq: Every day | CUTANEOUS | Status: DC

## 2020-06-21 MED ORDER — MUPIROCIN 2 % EX OINT
1.0000 "application " | TOPICAL_OINTMENT | Freq: Two times a day (BID) | CUTANEOUS | Status: DC
  Administered 2020-06-21: 1 via NASAL
  Filled 2020-06-21: qty 22

## 2020-06-21 MED ORDER — OXYCODONE-ACETAMINOPHEN 5-325 MG PO TABS: 1.0000 | ORAL_TABLET | Freq: Four times a day (QID) | ORAL | Status: DC | PRN

## 2020-06-21 MED ORDER — OXYCODONE-ACETAMINOPHEN 5-325 MG PO TABS: 1.0000 | ORAL_TABLET | Freq: Four times a day (QID) | ORAL | 0 refills | Status: DC | PRN

## 2020-06-21 MED ORDER — ASPIRIN 81 MG PO TBEC: 81.0000 mg | DELAYED_RELEASE_TABLET | Freq: Every day | ORAL | 3 refills | Status: DC

## 2020-06-21 MED ORDER — PROPOFOL 500 MG/50ML IV EMUL
INTRAVENOUS | Status: AC
  Filled 2020-06-21: qty 600

## 2020-06-21 NOTE — Plan of Care (Signed)
Neuro: stable at baseline Resp: stable at baseline on 3L [place] CV: afebrile, stable at baseline with arrythmia GIGU: tolerating regular diet well, urinated using bedside commode, no BM Skin: clean dry and intact, surgical site covered with gauze and paper tape per patient's request Social: Daughter at bedside throughout the day, all questions and concerns addressed.   Events: D/C home today  Problem: Education: Goal: Knowledge of General Education information will improve Description: Including pain rating scale, medication(s)/side effects and non-pharmacologic comfort measures Outcome: Adequate for Discharge   Problem: Health Behavior/Discharge Planning: Goal: Ability to manage health-related needs will improve Outcome: Adequate for Discharge   Problem: Clinical Measurements: Goal: Ability to maintain clinical measurements within normal limits will improve Outcome: Adequate for Discharge Goal: Will remain free from infection Outcome: Adequate for Discharge Goal: Diagnostic test results will improve Outcome: Adequate for Discharge Goal: Respiratory complications will improve Outcome: Adequate for Discharge Goal: Cardiovascular complication will be avoided Outcome: Adequate for Discharge   Problem: Activity: Goal: Risk for activity intolerance will decrease Outcome: Adequate for Discharge   Problem: Nutrition: Goal: Adequate nutrition will be maintained Outcome: Adequate for Discharge   Problem: Coping: Goal: Level of anxiety will decrease Outcome: Adequate for Discharge   Problem: Elimination: Goal: Will not experience complications related to bowel motility Outcome: Adequate for Discharge Goal: Will not experience complications related to urinary retention Outcome: Adequate for Discharge   Problem: Pain Managment: Goal: General experience of comfort will improve Outcome: Adequate for Discharge   Problem: Safety: Goal: Ability to remain free from injury will  improve Outcome: Adequate for Discharge   Problem: Skin Integrity: Goal: Risk for impaired skin integrity will decrease Outcome: Adequate for Discharge

## 2020-06-21 NOTE — Discharge Summary (Signed)
[facility]    Discharge Summary  Patient ID:  TABRIA STEINES MRN: [id number] DOB/AGE: 08/05/1960 60 y.o.  Admit date: 06/20/2020 Discharge date: 06/21/2020 Date of Surgery: 06/20/2020 Surgeon: Surgeon(s): Annice Needy, MD  Admission Diagnosis: Carotid stenosis, right [I65.21]  Discharge Diagnoses:  Carotid stenosis, right [I65.21]  Secondary Diagnoses: Past Medical History:  Diagnosis Date  . Anginal pain (HCC)    every once in a while  . Aortic atherosclerosis (HCC)   . Arthritis   . Cardiomyopathy (HCC)   . Carotid stenosis   . CHF (congestive heart failure) (HCC)    probable, takes HCTZ as a "fluid pill"  . Complication of anesthesia    difficulty waking up after AVR  . COPD (chronic obstructive pulmonary disease) (HCC)    probable, no specific diagnosis  . Depression    take fluoxetine  . Dyspnea   . Hypertension   . Hypothyroidism   . LBBB (left bundle branch block)   . Morbid obesity (HCC)   . Nonrheumatic aortic (valve) stenosis    s/p TAVR  . Obesity hypoventilation syndrome (HCC)   . OSA treated with BiPAP   . Thyroid disease    hypothryoidism   Procedure(s): 06/20/20: Right carotid endarterectomy with CorMatrix arterial patch reconstruction  Discharged Condition: Good  HPI / Hospital Course:  Kristie Dorsey is a 60 year old female who presents with right carotid stenosis of >80%.  I discussed with the patient the risks, benefits, and alternatives to carotid endarterectomy.  I discussed the differences between carotid stenting and carotid endarterectomy. I discussed the procedural details of carotid endarterectomy with the patient.  The patient is aware that the risks of carotid endarterectomy include but are not limited to: bleeding, infection, stroke, myocardial infarction, death, cranial nerve injuries both temporary and permanent, neck hematoma, possible airway compromise, labile blood pressure post-operatively, cerebral  hyperperfusion syndrome, and possible need for additional interventions in the future. The patient is aware of the risks and agrees to proceed forward with the procedure. On June 20, 2020 the patient underwent,   Right carotid endarterectomy with CorMatrix arterial patch reconstruction  She tolerated the procedure well was transferred from the operating room to the ICU for observation overnight.  The patient's site of surgery was unremarkable.  During the patient's brief stay, her diet was advanced, her Foley was removed and she was urinating independently, her discomfort was controlled to the use of PO pain medication and she was ambulating at baseline.  Day of discharge, the patient was afebrile with stable vital signs.  Physical exam:  Alert and oriented x3, No acute distress Face: Symmetrical, Tongue midline Neck: Incision - clean dry and intact, minimal swelling some ecchymosis noted but minimally. Cardiovascular: Regular rate and rhythm Pulmonary: Clear to auscultation bilaterally Abdomen: Soft, non-distended, non-tender Extremity: Warm distally toes Neurological: Intact, no deficits noted  Labs: As below  Complications: None  Consults: None  Significant Diagnostic Studies: CBC Lab Results  Component Value Date   WBC 8.6 06/21/2020   HGB 11.6 (L) 06/21/2020   HCT 35.7 (L) 06/21/2020   MCV 88.1 06/21/2020   PLT 185 06/21/2020   BMET    Component Value Date/Time   NA 140 06/21/2020 0330   NA 136 02/27/2013 1054   K 4.4 06/21/2020 0330   K 4.2 02/27/2013 1054   CL 107 06/21/2020 0330   CL 105 02/27/2013 1054   CO2 25 06/21/2020 0330   CO2 32 02/27/2013 1054  GLUCOSE 160 (H) 06/21/2020 0330   GLUCOSE 100 (H) 02/27/2013 1054   BUN 12 06/21/2020 0330   BUN 11 02/27/2013 1054   CREATININE 1.06 (H) 06/21/2020 0330   CREATININE 0.72 02/27/2013 1054   CALCIUM 8.8 (L) 06/21/2020 0330   CALCIUM 9.1 02/27/2013 1054   GFRNONAA >60 06/21/2020 0330   GFRNONAA >60  02/27/2013 1054   GFRAA >60 09/22/2019 1141   GFRAA >60 02/27/2013 1054   COAG Lab Results  Component Value Date   INR 1.0 06/18/2020   Disposition:  Discharge to :Home  Allergies as of 06/21/2020   No Known Allergies     Medication List    TAKE these medications   albuterol 108 (90 Base) MCG/ACT inhaler Commonly known as: VENTOLIN HFA Inhale 2-4 puffs by mouth every 4 hours as needed for wheezing, cough, and/or shortness of breath What changed:   how much to take  how to take this  when to take this  reasons to take this   aspirin 81 MG EC tablet Take 1 tablet (81 mg total) by mouth daily at 6 (six) AM. Swallow whole. Start taking on: June 22, 2020   atorvastatin 10 MG tablet Commonly known as: LIPITOR Take 10 mg by mouth daily.   buPROPion 150 MG 12 hr tablet Commonly known as: WELLBUTRIN SR Take 150 mg by mouth 2 (two) times daily.   clopidogrel 75 MG tablet Commonly known as: PLAVIX Take 75 mg by mouth daily.   Entresto 49-51 MG Generic drug: sacubitril-valsartan Take 1 tablet by mouth 2 (two) times daily.   FLUoxetine 40 MG capsule Commonly known as: PROZAC Take 40 mg by mouth daily.   furosemide 40 MG tablet Commonly known as: LASIX Take 40 mg by mouth daily.   levothyroxine 75 MCG tablet Commonly known as: SYNTHROID Take 75 mcg by mouth daily before breakfast.   megestrol 40 MG tablet Commonly known as: MEGACE Take by mouth.   meloxicam 15 MG tablet Commonly known as: MOBIC Take 15 mg by mouth daily.   mometasone-formoterol 200-5 MCG/ACT Aero Commonly known as: DULERA Inhale 2 puffs into the lungs 2 (two) times daily.   multivitamin with minerals tablet Take 1 tablet by mouth daily.   oxyCODONE-acetaminophen 5-325 MG tablet Commonly known as: PERCOCET/ROXICET Take 1 tablet by mouth every 6 (six) hours as needed for moderate pain.   Spiriva Respimat 2.5 MCG/ACT Aers Generic drug: Tiotropium Bromide Monohydrate Inhale 2 puffs  into the lungs daily.   spironolactone 25 MG tablet Commonly known as: ALDACTONE Take 25 mg by mouth daily.   traZODone 100 MG tablet Commonly known as: DESYREL Take 100 mg by mouth at bedtime.      Verbal and written Discharge instructions given to the patient. Wound care per Discharge AVS  Follow-up Information    Georgiana Spinner, NP Follow up in 2 week(s).   Specialty: Vascular Surgery Why: First postoperative incision check. Contact information: [address] [place] [address] [phone]        Georgiana Spinner, NP In 2 weeks.   Specialty: Vascular Surgery Why: APPOINTMENT Jul 05, 2020 AT 11 AM Contact information: [address] [place] [address] [phone]              Signed: Tonette Lederer, PA-C  06/21/2020, 12:50 PM

## 2020-06-21 NOTE — Discharge Instructions (Signed)
1) you may shower as of tomorrow.  Please gently clean your incision with soap and water.  Gently pat dry.  The purple glue on top of the incision will fall off on its own over the next week or 2.  Please do not try to remove it. 2) please do not engage in strenuous activity or lifting greater than 10 pounds until you are cleared at your first postoperative visit 3) no driving for at least 2 weeks 4) no drinking alcohol or driving while taking pain medication

## 2020-06-21 NOTE — Anesthesia Postprocedure Evaluation (Signed)
Anesthesia Post Note  Patient: Kristie Dorsey  Procedure(s) Performed: ENDARTERECTOMY CAROTID (Right )  Patient location during evaluation: ICU Anesthesia Type: General Level of consciousness: awake Pain management: pain level controlled Vital Signs Assessment: post-procedure vital signs reviewed and stable Respiratory status: spontaneous breathing Anesthetic complications: no   No complications documented.   Last Vitals:  Vitals:   06/21/20 0610 06/21/20 0620  BP: 140/64   Pulse: 93 93  Resp: 20 19  Temp:    SpO2: 97% 96%    Last Pain:  Vitals:   06/21/20 0400  TempSrc: Oral  PainSc: Asleep                 Jaye Beagle

## 2020-06-22 ENCOUNTER — Other Ambulatory Visit: Payer: Self-pay | Admitting: Vascular Surgery

## 2020-06-24 LAB — SURGICAL PATHOLOGY

## 2020-06-25 ENCOUNTER — Other Ambulatory Visit (INDEPENDENT_AMBULATORY_CARE_PROVIDER_SITE_OTHER): Payer: Self-pay | Admitting: Vascular Surgery

## 2020-06-27 ENCOUNTER — Telehealth (INDEPENDENT_AMBULATORY_CARE_PROVIDER_SITE_OTHER): Payer: Self-pay | Admitting: Nurse Practitioner

## 2020-06-27 NOTE — Telephone Encounter (Signed)
Patient is calling on the status of her FMLA form, she wants to know if form was faxed?  I believe patient called earlier this week/end of last week about FMLA form and was told about the $25.00 fee.  Please advise

## 2020-06-27 NOTE — Telephone Encounter (Signed)
Patient was made aware that paper work will be faxed over once completed

## 2020-07-02 ENCOUNTER — Other Ambulatory Visit (INDEPENDENT_AMBULATORY_CARE_PROVIDER_SITE_OTHER): Payer: Self-pay | Admitting: Nurse Practitioner

## 2020-07-04 ENCOUNTER — Encounter: Payer: Self-pay | Admitting: Pulmonary Disease

## 2020-07-04 ENCOUNTER — Ambulatory Visit (INDEPENDENT_AMBULATORY_CARE_PROVIDER_SITE_OTHER): Payer: Medicaid Other | Admitting: Pulmonary Disease

## 2020-07-04 ENCOUNTER — Other Ambulatory Visit: Payer: Self-pay

## 2020-07-04 VITALS — BP 122/80 | HR 74 | Temp 97.5°F | Ht 62.0 in | Wt 332.8 lb

## 2020-07-04 DIAGNOSIS — G4733 Obstructive sleep apnea (adult) (pediatric): Secondary | ICD-10-CM

## 2020-07-04 NOTE — Patient Instructions (Signed)
Will have [employer] adjust your Bipap settings  Follow up in 6 months

## 2020-07-04 NOTE — Progress Notes (Signed)
[facility]  Chief Complaint  Patient presents with  . Follow-up    Has not been able to wear bipap nightly due to recent neck surgery. Wore cpap last night for 5hr--feels pressure and mask is okay.      Constitutional:  BP 122/80 (BP Location: Left Arm, Cuff Size: Large)   Pulse 74   Temp (!) 97.5 F (36.4 C) (Temporal)   Ht 5\' 2"  (1.575 m)   Wt (!) 332 lb 12.8 oz (151 kg)   LMP 02/21/2020   SpO2 96%   BMI 60.87 kg/m   Past Medical History:  OA, CHF, Depression, HTN, Hypothyroidism, LBBB, Aortic stenosis s/p TAVR  Past Surgical History:  She  has a past surgical history that includes Cholecystectomy; Tonsillectomy; Tubal ligation; LEFT HEART CATH AND CORONARY ANGIOGRAPHY (N/A, 04/14/2018); Hysteroscopy with D & C (N/A, 02/28/2020); Cardiac valve replacement (11/2018); and Endarterectomy (Right, 06/20/2020).  Brief Summary:  Kristie Dorsey is a 60 y.o. female former smoker with COPD from emphysema, obstructive sleep apnea, obesity hypoventilation syndrome, and chronic respiratory failure with hypoxia.      Subjective:   She is here with her daughter.  Previously seen by Dr. 77.  Uses Bipap on regular basis.  Has full face mask.  Has to make mask tight to prevent leak, but then gets marks on her face.  Sleeping much better since starting Bipap.  Not having cough, wheeze, or sputum.  Uses 3 liters oxygen with exertion and at night with Bipap.  Had low dose CT chest in January.  Showed small nodule Rt upper lobe.  She had Rt carotid surgery a few weeks ago.  Auto Bipap download showed average AHI 0.8 with median Bipap 14/10 and 95 th percentile Bipap 18/14 cm H2O.  She did have significant air leak.  Physical Exam:   Appearance - well kempt, sitting in wheelchair  ENMT - no sinus tenderness, no oral exudate, no LAN, Mallampati 3 airway, no stridor  Respiratory - equal breath sounds bilaterally, no wheezing or  rales  CV - s1s2 regular rate and rhythm, no murmurs  Ext - no clubbing, no edema  Skin - no rashes  Psych - normal mood and affect   Pulmonary testing:   PFT 10/02/19 >> FEV1 1.46 (61%), FEV1% 81, TLC 3.11 (65%), DLCO 49%  Chest Imaging:   LDCT chest 03/07/20 >> mild centrilobular and paraseptal emphysema, diffuse bronchial wall thickening, 2.8 mm nodule RUL, numerous calcified granulomas  Sleep Tests:   09/28/19 >> AHI 132.6, SpO2 low 64%; suboptimal titration  Cardiac Tests:   Echo 03/25/18 >> EF 30 to 35%, grade 1 DD, mild AR  Social History:  She  reports that she quit smoking about 20 months ago. Her smoking use included cigarettes. She started smoking about 47 years ago. She has a 69.00 pack-year smoking history. She has never used smokeless tobacco. She reports that she does not drink alcohol and does not use drugs.  Family History:  Her family history includes Aneurysm in her mother; Cancer in her father. She was adopted.     Assessment/Plan:   COPD with emphysema. - continue spiriva, dulera - prn albuterol  Obstructive sleep apnea. - she is compliant with therapy and reports benefit from Bipap - uses [employer] for her DME - her pressure settings might be too high contributing to mask leak issues - will change her auto Bipap to max IPAP 18, min EPAP 8, PS 4 cm H2O  Obesity hypoventilation syndrome. - discussed importance of weight loss and encouraged her to keep up with diet modification efforts  Chronic respiratory failure with hypoxia. - continue 3 liters with exertion and at night with Bipap  Chronic combined CHF, Aortic stenosis s/p TAVR, Hypertension. - followed by Dr. Mirian Capuchin with cardiology at [facility]   Time Spent Involved in Patient Care on Day of Examination:  33 minutes  Follow up:  Patient Instructions  Will have [employer] adjust your Bipap settings  Follow up in 6 months   Medication List:   Allergies as of 07/04/2020    No Known Allergies     Medication List       Accurate as of Jul 04, 2020  1:18 PM. If you have any questions, ask your nurse or doctor.        albuterol 108 (90 Base) MCG/ACT inhaler Commonly known as: VENTOLIN HFA Inhale 2-4 puffs by mouth every 4 hours as needed for wheezing, cough, and/or shortness of breath What changed:   how much to take  how to take this  when to take this  reasons to take this   aspirin 81 MG EC tablet Take 1 tablet (81 mg total) by mouth daily at 6 (six) AM. Swallow whole.   atorvastatin 10 MG tablet Commonly known as: LIPITOR Take 10 mg by mouth daily.   buPROPion 150 MG 12 hr tablet Commonly known as: WELLBUTRIN SR Take 150 mg by mouth 2 (two) times daily.   clopidogrel 75 MG tablet Commonly known as: PLAVIX TAKE 1 TABLET BY MOUTH DAILY.   Entresto 49-51 MG Generic drug: sacubitril-valsartan Take 1 tablet by mouth 2 (two) times daily.   FLUoxetine 40 MG capsule Commonly known as: PROZAC Take 40 mg by mouth daily.   furosemide 40 MG tablet Commonly known as: LASIX Take 40 mg by mouth daily.   levothyroxine 75 MCG tablet Commonly known as: SYNTHROID Take 75 mcg by mouth daily before breakfast.   megestrol 40 MG tablet Commonly known as: MEGACE Take by mouth.   meloxicam 15 MG tablet Commonly known as: MOBIC Take 15 mg by mouth daily.   mometasone-formoterol 200-5 MCG/ACT Aero Commonly known as: DULERA Inhale 2 puffs into the lungs 2 (two) times daily.   multivitamin with minerals tablet Take 1 tablet by mouth daily.   oxyCODONE-acetaminophen 5-325 MG tablet Commonly known as: PERCOCET/ROXICET Take 1 tablet by mouth every 6 (six) hours as needed for moderate pain.   Spiriva Respimat 2.5 MCG/ACT Aers Generic drug: Tiotropium Bromide Monohydrate Inhale 2 puffs into the lungs daily.   spironolactone 25 MG tablet Commonly known as: ALDACTONE Take 25 mg by mouth daily.   traZODone 100 MG tablet Commonly known as:  DESYREL Take 100 mg by mouth at bedtime.       Signature:  Coralyn Helling, MD [facility] Pager - [phone] 07/04/2020, 1:18 PM

## 2020-07-05 ENCOUNTER — Ambulatory Visit (INDEPENDENT_AMBULATORY_CARE_PROVIDER_SITE_OTHER): Payer: Medicaid Other | Admitting: Nurse Practitioner

## 2020-07-08 ENCOUNTER — Encounter (INDEPENDENT_AMBULATORY_CARE_PROVIDER_SITE_OTHER): Payer: Self-pay | Admitting: Nurse Practitioner

## 2020-07-08 ENCOUNTER — Other Ambulatory Visit: Payer: Self-pay

## 2020-07-08 ENCOUNTER — Ambulatory Visit (INDEPENDENT_AMBULATORY_CARE_PROVIDER_SITE_OTHER): Payer: Medicaid Other | Admitting: Nurse Practitioner

## 2020-07-08 VITALS — BP 134/60 | HR 80 | Ht 62.0 in | Wt 331.0 lb

## 2020-07-08 DIAGNOSIS — I6521 Occlusion and stenosis of right carotid artery: Secondary | ICD-10-CM

## 2020-07-08 DIAGNOSIS — I10 Essential (primary) hypertension: Secondary | ICD-10-CM

## 2020-07-14 ENCOUNTER — Encounter (INDEPENDENT_AMBULATORY_CARE_PROVIDER_SITE_OTHER): Payer: Self-pay | Admitting: Nurse Practitioner

## 2020-07-14 NOTE — Progress Notes (Signed)
Subjective:    Patient ID: Kristie Dorsey, female    DOB: [birth year], 60 y.o.   MRN: [id number] Chief Complaint  Patient presents with  . Follow-up    2 week post op incision check    Kristie Dorsey is a 60 year old female that presents today after right carotid endarterectomy on 06/20/2020.  The patient notes there was some swelling and bruising at the incision site immediately postoperatively however today the incision is doing well with no evidence of dehiscence.  There is some swelling but that is not unexpected.  Very minimal bruising.  Overall patient is doing well.   Review of Systems  Skin: Positive for wound.  All other systems reviewed and are negative.      Objective:   Physical Exam Vitals reviewed.  HENT:     Head: Normocephalic.  Cardiovascular:     Rate and Rhythm: Normal rate.  Pulmonary:     Effort: Pulmonary effort is normal.  Skin:    General: Skin is warm and dry.  Neurological:     Mental Status: She is alert and oriented to person, place, and time.  Psychiatric:        Mood and Affect: Mood normal.        Behavior: Behavior normal.        Thought Content: Thought content normal.        Judgment: Judgment normal.     BP 134/60   Pulse 80   Ht 5\' 2"  (1.575 m)   Wt (!) 331 lb (150.1 kg)   LMP 02/21/2020   BMI 60.54 kg/m   Past Medical History:  Diagnosis Date  . Anginal pain (HCC)    every once in a while  . Aortic atherosclerosis (HCC)   . Arthritis   . Cardiomyopathy (HCC)   . Carotid stenosis   . CHF (congestive heart failure) (HCC)    probable, takes HCTZ as a "fluid pill"  . Complication of anesthesia    difficulty waking up after AVR  . COPD (chronic obstructive pulmonary disease) (HCC)    probable, no specific diagnosis  . Depression    take fluoxetine  . Dyspnea   . Hypertension   . Hypothyroidism   . LBBB (left bundle branch block)   . Morbid obesity (HCC)   . Nonrheumatic aortic (valve) stenosis    s/p TAVR  . Obesity  hypoventilation syndrome (HCC)   . OSA treated with BiPAP   . Thyroid disease    hypothryoidism    Social History   Socioeconomic History  . Marital status: Married    Spouse name: eugene  . Number of children: 2  . Years of education: Not on file  . Highest education level: Not on file  Occupational History  . Occupation: 02/23/2020    Comment: disability  Tobacco Use  . Smoking status: Former Smoker    Packs/day: 1.50    Years: 46.00    Pack years: 69.00    Types: Cigarettes    Start date: 03/02/1973    Quit date: 11/01/2018    Years since quitting: 1.7  . Smokeless tobacco: Never Used  . Tobacco comment: Vaping daily--05/02/2020  Vaping Use  . Vaping Use: Every day  . Start date: 11/01/2018  . Substances: Nicotine  . Devices: trying to quit  Substance and Sexual Activity  . Alcohol use: No  . Drug use: No  . Sexual activity: Not Currently  Other Topics Concern  . Not on file  Social  History Narrative   Patient lives with husband and 2 daughters. Has dogs.   Patient feels safe in her home.   She still vapes on a daily basis. Nicotine in her liquid.   Social Determinants of Health   Financial Resource Strain: Not on file  Food Insecurity: Not on file  Transportation Needs: Not on file  Physical Activity: Not on file  Stress: Not on file  Social Connections: Not on file  Intimate Partner Violence: Not on file    Past Surgical History:  Procedure Laterality Date  . CARDIAC VALVE REPLACEMENT  11/2018   AVR  . CHOLECYSTECTOMY    . ENDARTERECTOMY Right 06/20/2020   Procedure: ENDARTERECTOMY CAROTID;  Surgeon: Annice Needy, MD;  Location: [facility] ORS;  Service: Vascular;  Laterality: Right;  . HYSTEROSCOPY WITH D & C N/A 02/28/2020   Procedure: DILATATION AND CURETTAGE;  Surgeon: Ward, Elenora Fender, MD;  Location: [facility] ORS;  Service: Gynecology;  Laterality: N/A;  . LEFT HEART CATH AND CORONARY ANGIOGRAPHY N/A 04/14/2018   Procedure: LEFT HEART CATH AND CORONARY  ANGIOGRAPHY;  Surgeon: Alwyn Pea, MD;  Location: [facility] INVASIVE CV LAB;  Service: Cardiovascular;  Laterality: N/A;  . TONSILLECTOMY    . TUBAL LIGATION      Family History  Adopted: Yes  Problem Relation Age of Onset  . Aneurysm Mother   . Cancer Father     No Known Allergies  CBC Latest Ref Rng & Units 06/21/2020 06/18/2020 02/28/2020  WBC 4.0 - 10.5 K/uL 8.6 6.7 7.8  Hemoglobin 12.0 - 15.0 g/dL 11.6(L) 11.4(L) 12.0  Hematocrit 36.0 - 46.0 % 35.7(L) 35.1(L) 37.5  Platelets 150 - 400 K/uL 185 188 189      CMP     Component Value Date/Time   NA 140 06/21/2020 0330   NA 136 02/27/2013 1054   K 4.4 06/21/2020 0330   K 4.2 02/27/2013 1054   CL 107 06/21/2020 0330   CL 105 02/27/2013 1054   CO2 25 06/21/2020 0330   CO2 32 02/27/2013 1054   GLUCOSE 160 (H) 06/21/2020 0330   GLUCOSE 100 (H) 02/27/2013 1054   BUN 12 06/21/2020 0330   BUN 11 02/27/2013 1054   CREATININE 1.06 (H) 06/21/2020 0330   CREATININE 0.72 02/27/2013 1054   CALCIUM 8.8 (L) 06/21/2020 0330   CALCIUM 9.1 02/27/2013 1054   PROT 7.3 09/22/2019 1141   ALBUMIN 3.5 09/22/2019 1141   AST 14 (L) 09/22/2019 1141   ALT 15 09/22/2019 1141   ALKPHOS 87 09/22/2019 1141   BILITOT 0.8 09/22/2019 1141   GFRNONAA >60 06/21/2020 0330   GFRNONAA >60 02/27/2013 1054   GFRAA >60 09/22/2019 1141   GFRAA >60 02/27/2013 1054     No results found.     Assessment & Plan:   1. Carotid stenosis, right The patient has some swelling near her incision but that is expected.  The incision is clean dry and intact.  He is healing well with no evidence of infection.  We will have the patient return in 1 month with noninvasive studies.  2. Primary hypertension Continue antihypertensive medications as already ordered, these medications have been reviewed and there are no changes at this time.    Current Outpatient Medications on File Prior to Visit  Medication Sig Dispense Refill  . albuterol (PROVENTIL  HFA;VENTOLIN HFA) 108 (90 Base) MCG/ACT inhaler Inhale 2-4 puffs by mouth every 4 hours as needed for wheezing, cough, and/or shortness of breath (Patient taking differently: Inhale  2 puffs into the lungs every 4 (four) hours as needed for wheezing or shortness of breath. Inhale 2-4 puffs by mouth every 4 hours as needed for wheezing, cough, and/or shortness of breath) 1 Inhaler 1  . aspirin EC 81 MG EC tablet Take 1 tablet (81 mg total) by mouth daily at 6 (six) AM. Swallow whole. 90 tablet 3  . atorvastatin (LIPITOR) 10 MG tablet Take 10 mg by mouth daily.    Marland Kitchen buPROPion (WELLBUTRIN SR) 150 MG 12 hr tablet Take 150 mg by mouth 2 (two) times daily.    Marland Kitchen buPROPion (WELLBUTRIN XL) 150 MG 24 hr tablet Take 1 tablet by mouth 2 (two) times daily.    . clopidogrel (PLAVIX) 75 MG tablet TAKE 1 TABLET BY MOUTH DAILY. 30 tablet 0  . FLUoxetine (PROZAC) 40 MG capsule Take 40 mg by mouth daily.    . furosemide (LASIX) 40 MG tablet Take 40 mg by mouth daily.     Marland Kitchen levothyroxine (SYNTHROID, LEVOTHROID) 75 MCG tablet Take 75 mcg by mouth daily before breakfast.     . megestrol (MEGACE) 40 MG tablet Take by mouth.    . meloxicam (MOBIC) 15 MG tablet Take 15 mg by mouth daily.    . mometasone-formoterol (DULERA) 200-5 MCG/ACT AERO Inhale 2 puffs into the lungs 2 (two) times daily. 13 g 6  . Multiple Vitamins-Minerals (MULTIVITAMIN WITH MINERALS) tablet Take 1 tablet by mouth daily.    Marland Kitchen oxyCODONE-acetaminophen (PERCOCET/ROXICET) 5-325 MG tablet Take 1 tablet by mouth every 6 (six) hours as needed for moderate pain. 28 tablet 0  . sacubitril-valsartan (ENTRESTO) 49-51 MG Take 1 tablet by mouth 2 (two) times daily.    Marland Kitchen spironolactone (ALDACTONE) 25 MG tablet Take 25 mg by mouth daily.    . Tiotropium Bromide Monohydrate (SPIRIVA RESPIMAT) 2.5 MCG/ACT AERS Inhale 2 puffs into the lungs daily. 4 g 11  . traZODone (DESYREL) 100 MG tablet Take 100 mg by mouth at bedtime.     No current facility-administered  medications on file prior to visit.    There are no Patient Instructions on file for this visit. No follow-ups on file.   Georgiana Spinner, NP

## 2020-08-01 ENCOUNTER — Other Ambulatory Visit (INDEPENDENT_AMBULATORY_CARE_PROVIDER_SITE_OTHER): Payer: Self-pay | Admitting: Vascular Surgery

## 2020-08-01 ENCOUNTER — Telehealth (INDEPENDENT_AMBULATORY_CARE_PROVIDER_SITE_OTHER): Payer: Self-pay

## 2020-08-05 ENCOUNTER — Ambulatory Visit (INDEPENDENT_AMBULATORY_CARE_PROVIDER_SITE_OTHER): Payer: Medicaid Other | Admitting: Nurse Practitioner

## 2020-08-05 ENCOUNTER — Encounter (INDEPENDENT_AMBULATORY_CARE_PROVIDER_SITE_OTHER): Payer: Medicaid Other

## 2020-08-14 ENCOUNTER — Other Ambulatory Visit (INDEPENDENT_AMBULATORY_CARE_PROVIDER_SITE_OTHER): Payer: Self-pay | Admitting: Vascular Surgery

## 2020-08-14 DIAGNOSIS — I6523 Occlusion and stenosis of bilateral carotid arteries: Secondary | ICD-10-CM

## 2020-08-15 ENCOUNTER — Encounter (INDEPENDENT_AMBULATORY_CARE_PROVIDER_SITE_OTHER): Payer: Medicaid Other

## 2020-08-15 ENCOUNTER — Encounter (INDEPENDENT_AMBULATORY_CARE_PROVIDER_SITE_OTHER): Payer: Self-pay | Admitting: Nurse Practitioner

## 2020-08-15 ENCOUNTER — Ambulatory Visit (INDEPENDENT_AMBULATORY_CARE_PROVIDER_SITE_OTHER): Payer: Medicaid Other | Admitting: Nurse Practitioner

## 2020-08-16 ENCOUNTER — Ambulatory Visit (INDEPENDENT_AMBULATORY_CARE_PROVIDER_SITE_OTHER): Payer: Medicaid Other

## 2020-08-16 ENCOUNTER — Ambulatory Visit (INDEPENDENT_AMBULATORY_CARE_PROVIDER_SITE_OTHER): Payer: Medicaid Other | Admitting: Nurse Practitioner

## 2020-08-16 ENCOUNTER — Encounter (INDEPENDENT_AMBULATORY_CARE_PROVIDER_SITE_OTHER): Payer: Self-pay | Admitting: Nurse Practitioner

## 2020-08-16 ENCOUNTER — Other Ambulatory Visit: Payer: Self-pay

## 2020-08-16 VITALS — BP 181/54 | HR 85 | Resp 16 | Wt 327.4 lb

## 2020-08-16 DIAGNOSIS — I6523 Occlusion and stenosis of bilateral carotid arteries: Secondary | ICD-10-CM

## 2020-08-16 DIAGNOSIS — I10 Essential (primary) hypertension: Secondary | ICD-10-CM

## 2020-08-16 DIAGNOSIS — I6521 Occlusion and stenosis of right carotid artery: Secondary | ICD-10-CM

## 2020-08-16 NOTE — Progress Notes (Signed)
Subjective:    Patient ID: Kristie Dorsey, female    DOB: 03/15/1960, 60 y.o.   MRN: [id number] Chief Complaint  Patient presents with  . Follow-up    Ultrasound followup    Kristie Dorsey is a 60 year old female that is seen for follow up evaluation of carotid stenosis status post right carotid endarterectomy on 06/20/2020.  There were no post operative problems or complications related to the surgery.  The patient denies neck or incisional pain.  The patient denies interval amaurosis fugax. There is no recent history of TIA symptoms or focal motor deficits. There is no prior documented CVA.  The patient denies headache.  The patient is taking enteric-coated aspirin 81 mg daily.  The patient has a history of coronary artery disease, no recent episodes of angina or shortness of breath. The patient denies PAD or claudication symptoms. There is a history of hyperlipidemia which is being treated with a statin.    Today noninvasive studies show a widely patent right carotid endarterectomy site.  1 to 39% stenosis noted.  The left ICA has a 40 to 59% stenosis noted.   Review of Systems  Skin:  Negative for wound.  All other systems reviewed and are negative.     Objective:   Physical Exam Vitals reviewed.  HENT:     Head: Normocephalic.  Neck:     Vascular: Carotid bruit present.  Cardiovascular:     Rate and Rhythm: Normal rate.  Pulmonary:     Effort: Pulmonary effort is normal.  Neurological:     Mental Status: She is alert and oriented to person, place, and time.  Psychiatric:        Mood and Affect: Mood normal.        Behavior: Behavior normal.        Thought Content: Thought content normal.        Judgment: Judgment normal.    BP (!) 181/54 (BP Location: Right Arm)   Pulse 85   Resp 16   Wt (!) 327 lb 6.4 oz (148.5 kg)   LMP 02/21/2020   BMI 59.88 kg/m   Past Medical History:  Diagnosis Date  . Anginal pain (HCC)    every once in a while  . Aortic  atherosclerosis (HCC)   . Arthritis   . Cardiomyopathy (HCC)   . Carotid stenosis   . CHF (congestive heart failure) (HCC)    probable, takes HCTZ as a "fluid pill"  . Complication of anesthesia    difficulty waking up after AVR  . COPD (chronic obstructive pulmonary disease) (HCC)    probable, no specific diagnosis  . Depression    take fluoxetine  . Dyspnea   . Hypertension   . Hypothyroidism   . LBBB (left bundle branch block)   . Morbid obesity (HCC)   . Nonrheumatic aortic (valve) stenosis    s/p TAVR  . Obesity hypoventilation syndrome (HCC)   . OSA treated with BiPAP   . Thyroid disease    hypothryoidism    Social History   Socioeconomic History  . Marital status: Married    Spouse name: Kristie Dorsey  . Number of children: 2  . Years of education: Not on file  . Highest education level: Not on file  Occupational History  . Occupation: Designer, multimedia    Comment: disability  Tobacco Use  . Smoking status: Former    Packs/day: 1.50    Years: 46.00    Pack years: 69.00    Types:  Cigarettes    Start date: 03/02/1973    Quit date: 11/01/2018    Years since quitting: 1.7  . Smokeless tobacco: Never  . Tobacco comments:    Vaping daily--05/02/2020  Vaping Use  . Vaping Use: Every day  . Start date: 11/01/2018  . Substances: Nicotine  . Devices: trying to quit  Substance and Sexual Activity  . Alcohol use: No  . Drug use: No  . Sexual activity: Not Currently  Other Topics Concern  . Not on file  Social History Narrative   Patient lives with husband and 2 daughters. Has dogs.   Patient feels safe in her home.   She still vapes on a daily basis. Nicotine in her liquid.   Social Determinants of Health   Financial Resource Strain: Not on file  Food Insecurity: Not on file  Transportation Needs: Not on file  Physical Activity: Not on file  Stress: Not on file  Social Connections: Not on file  Intimate Partner Violence: Not on file    Past Surgical History:   Procedure Laterality Date  . CARDIAC VALVE REPLACEMENT  11/2018   AVR  . CHOLECYSTECTOMY    . ENDARTERECTOMY Right 06/20/2020   Procedure: ENDARTERECTOMY CAROTID;  Surgeon: Annice Needy, MD;  Location: [facility] ORS;  Service: Vascular;  Laterality: Right;  . HYSTEROSCOPY WITH D & C N/A 02/28/2020   Procedure: DILATATION AND CURETTAGE;  Surgeon: Ward, Elenora Fender, MD;  Location: [facility] ORS;  Service: Gynecology;  Laterality: N/A;  . LEFT HEART CATH AND CORONARY ANGIOGRAPHY N/A 04/14/2018   Procedure: LEFT HEART CATH AND CORONARY ANGIOGRAPHY;  Surgeon: Alwyn Pea, MD;  Location: [facility] INVASIVE CV LAB;  Service: Cardiovascular;  Laterality: N/A;  . TONSILLECTOMY    . TUBAL LIGATION      Family History  Adopted: Yes  Problem Relation Age of Onset  . Aneurysm Mother   . Cancer Father     No Known Allergies  CBC Latest Ref Rng & Units 06/21/2020 06/18/2020 02/28/2020  WBC 4.0 - 10.5 K/uL 8.6 6.7 7.8  Hemoglobin 12.0 - 15.0 g/dL 11.6(L) 11.4(L) 12.0  Hematocrit 36.0 - 46.0 % 35.7(L) 35.1(L) 37.5  Platelets 150 - 400 K/uL 185 188 189      CMP     Component Value Date/Time   NA 140 06/21/2020 0330   NA 136 02/27/2013 1054   K 4.4 06/21/2020 0330   K 4.2 02/27/2013 1054   CL 107 06/21/2020 0330   CL 105 02/27/2013 1054   CO2 25 06/21/2020 0330   CO2 32 02/27/2013 1054   GLUCOSE 160 (H) 06/21/2020 0330   GLUCOSE 100 (H) 02/27/2013 1054   BUN 12 06/21/2020 0330   BUN 11 02/27/2013 1054   CREATININE 1.06 (H) 06/21/2020 0330   CREATININE 0.72 02/27/2013 1054   CALCIUM 8.8 (L) 06/21/2020 0330   CALCIUM 9.1 02/27/2013 1054   PROT 7.3 09/22/2019 1141   ALBUMIN 3.5 09/22/2019 1141   AST 14 (L) 09/22/2019 1141   ALT 15 09/22/2019 1141   ALKPHOS 87 09/22/2019 1141   BILITOT 0.8 09/22/2019 1141   GFRNONAA >60 06/21/2020 0330   GFRNONAA >60 02/27/2013 1054   GFRAA >60 09/22/2019 1141   GFRAA >60 02/27/2013 1054     No results found.     Assessment & Plan:   1. Carotid  stenosis, right Recommend:  The patient is s/p successful right CEA  Duplex ultrasound preoperatively shows 40 to 59% contralateral stenosis.  Continue antiplatelet therapy as  prescribed Continue management of CAD, HTN and Hyperlipidemia Healthy heart diet,  encouraged exercise at least 4 times per week  Follow up in 3 months with duplex ultrasound and physical exam  2. Primary hypertension Continue antihypertensive medications as already ordered, these medications have been reviewed and there are no changes at this time.    Current Outpatient Medications on File Prior to Visit  Medication Sig Dispense Refill  . albuterol (PROVENTIL HFA;VENTOLIN HFA) 108 (90 Base) MCG/ACT inhaler Inhale 2-4 puffs by mouth every 4 hours as needed for wheezing, cough, and/or shortness of breath (Patient taking differently: Inhale 2 puffs into the lungs every 4 (four) hours as needed for wheezing or shortness of breath. Inhale 2-4 puffs by mouth every 4 hours as needed for wheezing, cough, and/or shortness of breath) 1 Inhaler 1  . aspirin EC 81 MG EC tablet Take 1 tablet (81 mg total) by mouth daily at 6 (six) AM. Swallow whole. 90 tablet 3  . atorvastatin (LIPITOR) 10 MG tablet Take 10 mg by mouth daily.    Marland Kitchen buPROPion (WELLBUTRIN SR) 150 MG 12 hr tablet Take 150 mg by mouth 2 (two) times daily.    Marland Kitchen buPROPion (WELLBUTRIN XL) 150 MG 24 hr tablet Take 1 tablet by mouth 2 (two) times daily.    . clopidogrel (PLAVIX) 75 MG tablet TAKE 1 TABLET BY MOUTH DAILY. 30 tablet 0  . FLUoxetine (PROZAC) 40 MG capsule Take 40 mg by mouth daily.    . furosemide (LASIX) 40 MG tablet Take 40 mg by mouth daily.     Marland Kitchen levothyroxine (SYNTHROID, LEVOTHROID) 75 MCG tablet Take 75 mcg by mouth daily before breakfast.     . megestrol (MEGACE) 40 MG tablet Take by mouth.    . meloxicam (MOBIC) 15 MG tablet Take 15 mg by mouth daily.    . mometasone-formoterol (DULERA) 200-5 MCG/ACT AERO Inhale 2 puffs into the lungs 2 (two) times  daily. 13 g 6  . Multiple Vitamins-Minerals (MULTIVITAMIN WITH MINERALS) tablet Take 1 tablet by mouth daily.    Marland Kitchen oxyCODONE-acetaminophen (PERCOCET/ROXICET) 5-325 MG tablet Take 1 tablet by mouth every 6 (six) hours as needed for moderate pain. 28 tablet 0  . sacubitril-valsartan (ENTRESTO) 49-51 MG Take 1 tablet by mouth 2 (two) times daily.    Marland Kitchen spironolactone (ALDACTONE) 25 MG tablet Take 25 mg by mouth daily.    . Tiotropium Bromide Monohydrate (SPIRIVA RESPIMAT) 2.5 MCG/ACT AERS Inhale 2 puffs into the lungs daily. 4 g 11  . traZODone (DESYREL) 100 MG tablet Take 100 mg by mouth at bedtime.     No current facility-administered medications on file prior to visit.    There are no Patient Instructions on file for this visit. No follow-ups on file.   Georgiana Spinner, NP

## 2020-08-21 ENCOUNTER — Telehealth: Payer: Self-pay | Admitting: Pulmonary Disease

## 2020-08-21 NOTE — Telephone Encounter (Signed)
Spoke to patient, who states that she wanted to make Dr. Jayme Cloud aware that [facility] home healthcare will be sending over Rx for oxygen supplies. Will await fax.

## 2020-08-22 NOTE — Telephone Encounter (Signed)
Fax received and placed in Dr. Georgann Housekeeper look at folder for signature.

## 2020-08-29 ENCOUNTER — Other Ambulatory Visit (INDEPENDENT_AMBULATORY_CARE_PROVIDER_SITE_OTHER): Payer: Self-pay | Admitting: Vascular Surgery

## 2020-08-29 NOTE — Telephone Encounter (Signed)
Form has been completed and faxed back to [facility] healthcare. Lm to make patient aware.

## 2020-08-30 NOTE — Telephone Encounter (Signed)
Lm x2 for patient.  Will close encounter per office protocol.   

## 2020-09-19 ENCOUNTER — Other Ambulatory Visit (INDEPENDENT_AMBULATORY_CARE_PROVIDER_SITE_OTHER): Payer: Self-pay | Admitting: Vascular Surgery

## 2020-10-03 ENCOUNTER — Other Ambulatory Visit (INDEPENDENT_AMBULATORY_CARE_PROVIDER_SITE_OTHER): Payer: Self-pay | Admitting: Vascular Surgery

## 2020-11-11 ENCOUNTER — Other Ambulatory Visit (INDEPENDENT_AMBULATORY_CARE_PROVIDER_SITE_OTHER): Payer: Self-pay | Admitting: Vascular Surgery

## 2020-11-12 ENCOUNTER — Other Ambulatory Visit (INDEPENDENT_AMBULATORY_CARE_PROVIDER_SITE_OTHER): Payer: Self-pay | Admitting: Nurse Practitioner

## 2020-11-12 DIAGNOSIS — I6523 Occlusion and stenosis of bilateral carotid arteries: Secondary | ICD-10-CM

## 2020-11-12 DIAGNOSIS — Z9889 Other specified postprocedural states: Secondary | ICD-10-CM

## 2020-11-15 ENCOUNTER — Encounter (INDEPENDENT_AMBULATORY_CARE_PROVIDER_SITE_OTHER): Payer: Self-pay | Admitting: Vascular Surgery

## 2020-11-15 ENCOUNTER — Encounter (INDEPENDENT_AMBULATORY_CARE_PROVIDER_SITE_OTHER): Payer: Medicaid Other

## 2020-11-15 ENCOUNTER — Ambulatory Visit (INDEPENDENT_AMBULATORY_CARE_PROVIDER_SITE_OTHER): Payer: Medicaid Other | Admitting: Vascular Surgery

## 2020-11-15 ENCOUNTER — Encounter (INDEPENDENT_AMBULATORY_CARE_PROVIDER_SITE_OTHER): Payer: Self-pay

## 2020-12-04 ENCOUNTER — Other Ambulatory Visit: Payer: Self-pay

## 2020-12-04 ENCOUNTER — Ambulatory Visit (INDEPENDENT_AMBULATORY_CARE_PROVIDER_SITE_OTHER): Payer: Medicaid Other

## 2020-12-04 ENCOUNTER — Ambulatory Visit (INDEPENDENT_AMBULATORY_CARE_PROVIDER_SITE_OTHER): Payer: Medicaid Other | Admitting: Nurse Practitioner

## 2020-12-04 VITALS — BP 145/72 | HR 91 | Ht 62.0 in | Wt 325.0 lb

## 2020-12-04 DIAGNOSIS — I6523 Occlusion and stenosis of bilateral carotid arteries: Secondary | ICD-10-CM

## 2020-12-04 DIAGNOSIS — I10 Essential (primary) hypertension: Secondary | ICD-10-CM | POA: Diagnosis not present

## 2020-12-04 DIAGNOSIS — Z9889 Other specified postprocedural states: Secondary | ICD-10-CM | POA: Diagnosis not present

## 2020-12-04 MED ORDER — CLOPIDOGREL BISULFATE 75 MG PO TABS: 75.0000 mg | ORAL_TABLET | Freq: Every day | ORAL | 6 refills | Status: DC

## 2020-12-04 MED ORDER — ATORVASTATIN CALCIUM 10 MG PO TABS: 10.0000 mg | ORAL_TABLET | Freq: Every day | ORAL | 6 refills | Status: DC

## 2020-12-10 NOTE — Progress Notes (Signed)
Subjective:    Patient ID: Kristie Dorsey, female    DOB: [birth year], 60 y.o.   MRN: [id number] Chief Complaint  Patient presents with   Follow-up    3 mo carotid     GU burning is seen for follow up evaluation of carotid stenosis. The carotid stenosis followed by ultrasound.  The patient previously had a right endarterectomy.  The wound is doing well and is fully slightly visible.  The patient denies amaurosis fugax. There is no recent history of TIA symptoms or focal motor deficits. There is no prior documented CVA.  The patient is taking enteric-coated aspirin 81 mg daily.  There is no history of migraine headaches. There is no history of seizures.  The patient has a history of coronary artery disease, no recent episodes of angina or shortness of breath. The patient denies PAD or claudication symptoms. There is a history of hyperlipidemia which is being treated with a statin.    Carotid Duplex done today shows 39% stenosis bilaterally.  No change compared to last study in 08/16/2020   Review of Systems  Eyes:  Negative for visual disturbance.  All other systems reviewed and are negative.     Objective:   Physical Exam Vitals reviewed.  HENT:     Head: Normocephalic.  Neck:     Vascular: No carotid bruit.  Cardiovascular:     Rate and Rhythm: Normal rate.  Pulmonary:     Effort: Pulmonary effort is normal.  Skin:    General: Skin is warm and dry.  Neurological:     Mental Status: She is alert and oriented to person, place, and time.  Psychiatric:        Mood and Affect: Mood normal.        Behavior: Behavior normal.        Thought Content: Thought content normal.        Judgment: Judgment normal.    BP (!) 145/72   Pulse 91   Ht 5\' 2"  (1.575 m)   Wt (!) 325 lb (147.4 kg)   LMP 02/21/2020   BMI 59.44 kg/m   Past Medical History:  Diagnosis Date   Anginal pain (HCC)    every once in a while   Aortic atherosclerosis (HCC)    Arthritis    Cardiomyopathy  (HCC)    Carotid stenosis    CHF (congestive heart failure) (HCC)    probable, takes HCTZ as a "fluid pill"   Complication of anesthesia    difficulty waking up after AVR   COPD (chronic obstructive pulmonary disease) (HCC)    probable, no specific diagnosis   Depression    take fluoxetine   Dyspnea    Hypertension    Hypothyroidism    LBBB (left bundle branch block)    Morbid obesity (HCC)    Nonrheumatic aortic (valve) stenosis    s/p TAVR   Obesity hypoventilation syndrome (HCC)    OSA treated with BiPAP    Thyroid disease    hypothryoidism    Social History   Socioeconomic History   Marital status: Married    Spouse name: eugene   Number of children: 2   Years of education: Not on file   Highest education level: Not on file  Occupational History   Occupation: 02/23/2020    Comment: disability  Tobacco Use   Smoking status: Former    Packs/day: 1.50    Years: 46.00    Pack years: 69.00    Types:  Cigarettes    Start date: 03/02/1973    Quit date: 11/01/2018    Years since quitting: 2.1   Smokeless tobacco: Never   Tobacco comments:    Vaping daily--05/02/2020  Vaping Use   Vaping Use: Every day   Start date: 11/01/2018   Substances: Nicotine   Devices: trying to quit  Substance and Sexual Activity   Alcohol use: No   Drug use: No   Sexual activity: Not Currently  Other Topics Concern   Not on file  Social History Narrative   Patient lives with husband and 2 daughters. Has dogs.   Patient feels safe in her home.   She still vapes on a daily basis. Nicotine in her liquid.   Social Determinants of Health   Financial Resource Strain: Not on file  Food Insecurity: Not on file  Transportation Needs: Not on file  Physical Activity: Not on file  Stress: Not on file  Social Connections: Not on file  Intimate Partner Violence: Not on file    Past Surgical History:  Procedure Laterality Date   CARDIAC VALVE REPLACEMENT  11/2018   AVR   CHOLECYSTECTOMY      ENDARTERECTOMY Right 06/20/2020   Procedure: ENDARTERECTOMY CAROTID;  Surgeon: Annice Needy, MD;  Location: [facility];  Service: Vascular;  Laterality: Right;   HYSTEROSCOPY WITH D & C N/A 02/28/2020   Procedure: DILATATION AND CURETTAGE;  Surgeon: Ward, Elenora Fender, MD;  Location: [facility] OR[facility];  Service: Gynecology;  Laterality: N/A;   LEFT HEART CATH AND CORONARY ANGIOGRAPHY N/A 04/14/2018   Procedure: LEFT HEART CATH AND CORONARY ANGIOGRAPHY;  Surgeon: Alwyn Pea, MD;  Location: [facility];  Service: Cardiovascular;  Laterality: N/A;   TONSILLECTOMY     TUBAL LIGATION      Family History  Adopted: Yes  Problem Relation Age of Onset   Aneurysm Mother    Cancer Father     No Known Allergies  CBC Latest Ref Rng & Units 06/21/2020 06/18/2020 02/28/2020  WBC 4.0 - 10.5 K/uL 8.6 6.7 7.8  Hemoglobin 12.0 - 15.0 g/dL 11.6(L) 11.4(L) 12.0  Hematocrit 36.0 - 46.0 % 35.7(L) 35.1(L) 37.5  Platelets 150 - 400 K/uL 185 188 189      CMP     Component Value Date/Time   NA 140 06/21/2020 0330   NA 136 02/27/2013 1054   K 4.4 06/21/2020 0330   K 4.2 02/27/2013 1054   CL 107 06/21/2020 0330   CL 105 02/27/2013 1054   CO2 25 06/21/2020 0330   CO2 32 02/27/2013 1054   GLUCOSE 160 (H) 06/21/2020 0330   GLUCOSE 100 (H) 02/27/2013 1054   BUN 12 06/21/2020 0330   BUN 11 02/27/2013 1054   CREATININE 1.06 (H) 06/21/2020 0330   CREATININE 0.72 02/27/2013 1054   CALCIUM 8.8 (L) 06/21/2020 0330   CALCIUM 9.1 02/27/2013 1054   PROT 7.3 09/22/2019 1141   ALBUMIN 3.5 09/22/2019 1141   AST 14 (L) 09/22/2019 1141   ALT 15 09/22/2019 1141   ALKPHOS 87 09/22/2019 1141   BILITOT 0.8 09/22/2019 1141   GFRNONAA >60 06/21/2020 0330   GFRNONAA >60 02/27/2013 1054   GFRAA >60 09/22/2019 1141   GFRAA >60 02/27/2013 1054     No results found.     Assessment & Plan:   1. Bilateral carotid artery stenosis Recommend:  The patient is s/p successful right CEA, post 3 months  Duplex  ultrasound preoperatively shows 1-39% bilateral stenosis.  Continue antiplatelet  therapy as prescribed Continue management of CAD, HTN and Hyperlipidemia Healthy heart diet,  encouraged exercise at least 4 times per week  Follow up in 6 months with duplex ultrasound and physical exam based on the patient's carotid surgery  - VAS US CAROTID; Future  2. Primary hypertension Continue antihypertensive medications as already ordered, these medications have been reviewed and there are no changes at this time.    Current Outpatient Medications on File Prior to Visit  Medication Sig Dispense Refill   albuterol (PROVENTIL HFA;VENTOLIN HFA) 108 (90 Base) MCG/ACT inhaler Inhale 2-4 puffs by mouth every 4 hours as needed for wheezing, cough, and/or shortness of breath (Patient taking differently: Inhale 2 puffs into the lungs every 4 (four) hours as needed for wheezing or shortness of breath. Inhale 2-4 puffs by mouth every 4 hours as needed for wheezing, cough, and/or shortness of breath) 1 Inhaler 1   aspirin EC 81 MG EC tablet Take 1 tablet (81 mg total) by mouth daily at 6 (six) AM. Swallow whole. 90 tablet 3   buPROPion (WELLBUTRIN SR) 150 MG 12 hr tablet Take 150 mg by mouth 2 (two) times daily.     FLUoxetine (PROZAC) 40 MG capsule Take 40 mg by mouth daily.     furosemide (LASIX) 40 MG tablet Take 40 mg by mouth daily.      levothyroxine (SYNTHROID, LEVOTHROID) 75 MCG tablet Take 75 mcg by mouth daily before breakfast.      megestrol (MEGACE) 40 MG tablet Take by mouth.     meloxicam (MOBIC) 15 MG tablet Take 15 mg by mouth daily.     mometasone-formoterol (DULERA) 200-5 MCG/ACT AERO Inhale 2 puffs into the lungs 2 (two) times daily. 13 g 6   Multiple Vitamins-Minerals (MULTIVITAMIN WITH MINERALS) tablet Take 1 tablet by mouth daily.     OZEMPIC, 0.25 OR 0.5 MG/DOSE, 2 MG/1.5ML SOPN Inject into the skin.     sacubitril-valsartan (ENTRESTO) 49-51 MG Take 1 tablet by mouth 2 (two) times daily.      spironolactone (ALDACTONE) 25 MG tablet Take 25 mg by mouth daily.     Tiotropium Bromide Monohydrate (SPIRIVA RESPIMAT) 2.5 MCG/ACT AERS Inhale 2 puffs into the lungs daily. 4 g 11   traZODone (DESYREL) 100 MG tablet Take 100 mg by mouth at bedtime.     No current facility-administered medications on file prior to visit.    There are no Patient Instructions on file for this visit. Return in about 6 months (around 06/04/2021) for Carotid stenosis; JD/FB.   Georgiana Spinner, NP

## 2020-12-11 ENCOUNTER — Encounter (INDEPENDENT_AMBULATORY_CARE_PROVIDER_SITE_OTHER): Payer: Self-pay | Admitting: Nurse Practitioner

## 2021-01-30 ENCOUNTER — Telehealth: Payer: Medicaid Other | Admitting: Obstetrics and Gynecology

## 2021-01-30 ENCOUNTER — Ambulatory Visit: Payer: Medicaid Other | Admitting: Obstetrics and Gynecology

## 2021-02-04 ENCOUNTER — Telehealth: Payer: Self-pay | Admitting: Pulmonary Disease

## 2021-02-04 NOTE — Telephone Encounter (Signed)
Patient is scheduled to see Dr. Jayme Cloud tomorrow.  Per last OV note, patient is to follow with Dr. Craige Cotta. Lm for patient to clarify.

## 2021-02-04 NOTE — Telephone Encounter (Signed)
Spoke to patient, who stated that she would like to follow with Dr. Craige Cotta. 02/05/2021 visit canceled with Dr. Jayme Cloud.  OV scheduled 04/10/2020 with Dr. Craige Cotta. Nothing further needed at this time.

## 2021-02-05 ENCOUNTER — Ambulatory Visit: Payer: Medicaid Other | Admitting: Pulmonary Disease

## 2021-02-10 ENCOUNTER — Ambulatory Visit: Payer: Self-pay | Admitting: Urology

## 2021-02-10 ENCOUNTER — Encounter: Payer: Self-pay | Admitting: Urology

## 2021-03-04 ENCOUNTER — Other Ambulatory Visit: Payer: Self-pay | Admitting: Pulmonary Disease

## 2021-04-10 ENCOUNTER — Ambulatory Visit: Payer: Medicaid Other | Admitting: Pulmonary Disease

## 2021-04-24 ENCOUNTER — Other Ambulatory Visit: Payer: Self-pay | Admitting: *Deleted

## 2021-04-24 DIAGNOSIS — Z87891 Personal history of nicotine dependence: Secondary | ICD-10-CM

## 2021-05-01 IMAGING — US US PELVIS COMPLETE WITH TRANSVAGINAL
1 series · 13 of 25 positions shown · non-contrast
Comparison: None

CLINICAL DATA: Postmenopausal bleeding

EXAM:
TRANSABDOMINAL AND TRANSVAGINAL ULTRASOUND OF PELVIS
TECHNIQUE: Both transabdominal and transvaginal ultrasound examinations of the
pelvis were performed. Transabdominal technique was performed for
global imaging of the pelvis including uterus, ovaries, adnexal
regions, and pelvic cul-de-sac. It was necessary to proceed with
endovaginal exam following the transabdominal exam to visualize the
uterus endometrium.

[Series 1: us pelvis complete with transvaginal · 0.32mm/px · 82 acquisitions, 13 frames shown]
[im 1/82]
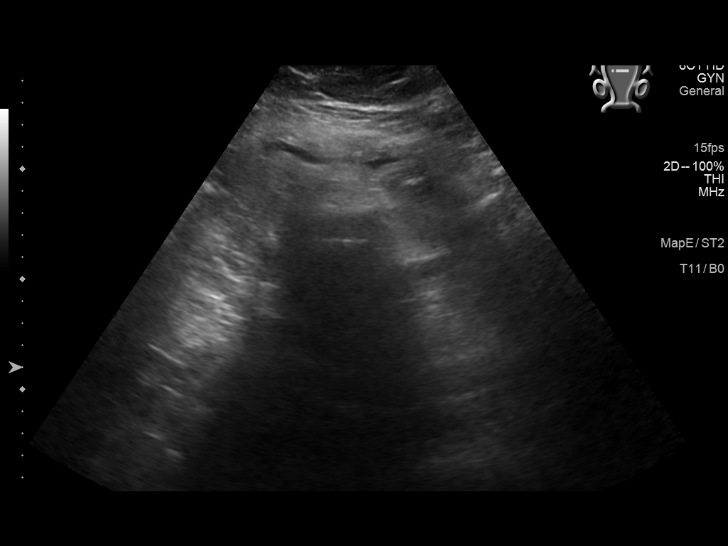
[im 7/82]
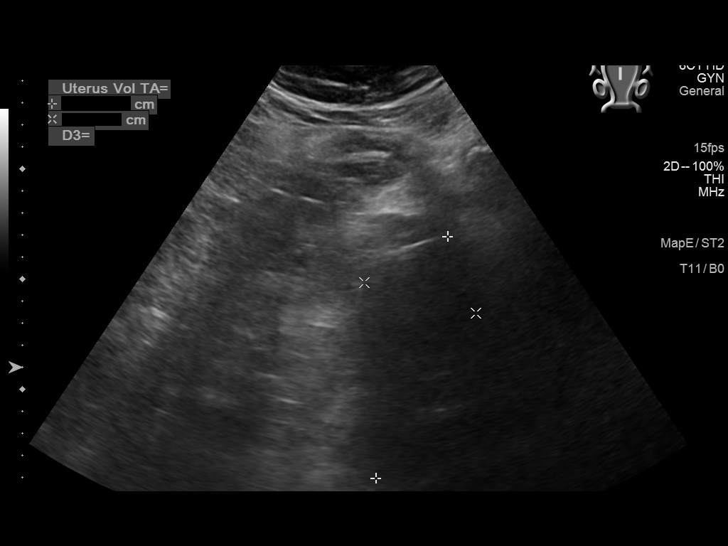
[im 14/82]
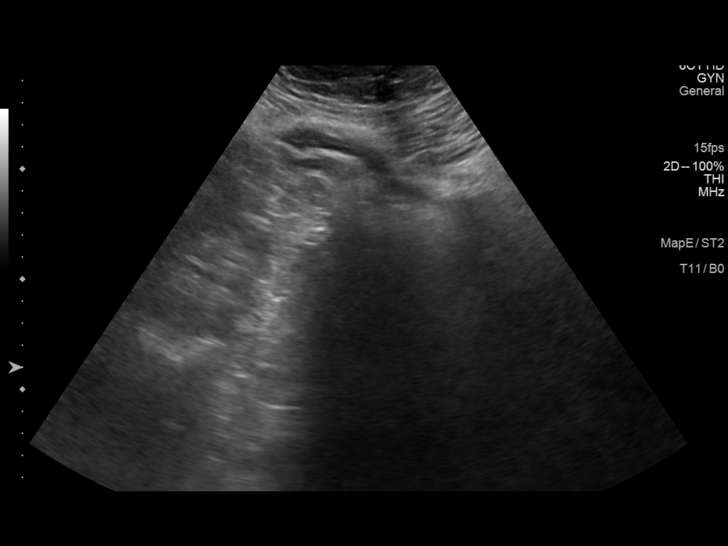
[im 21/82]
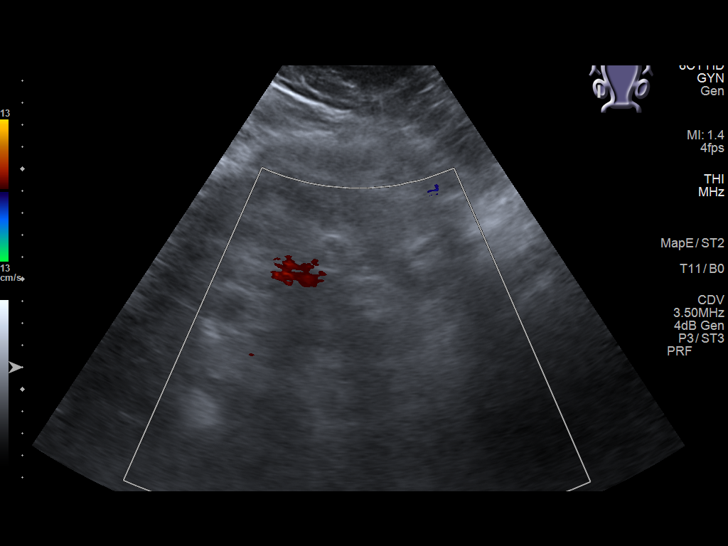
[im 28/82]
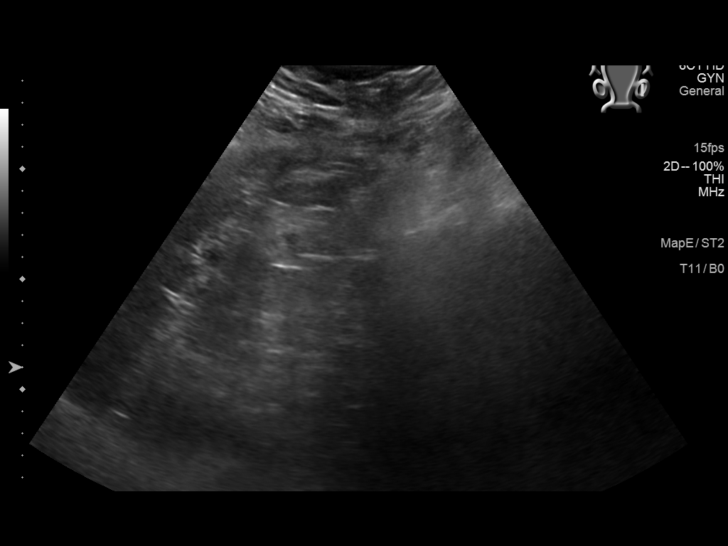
[im 34/82]
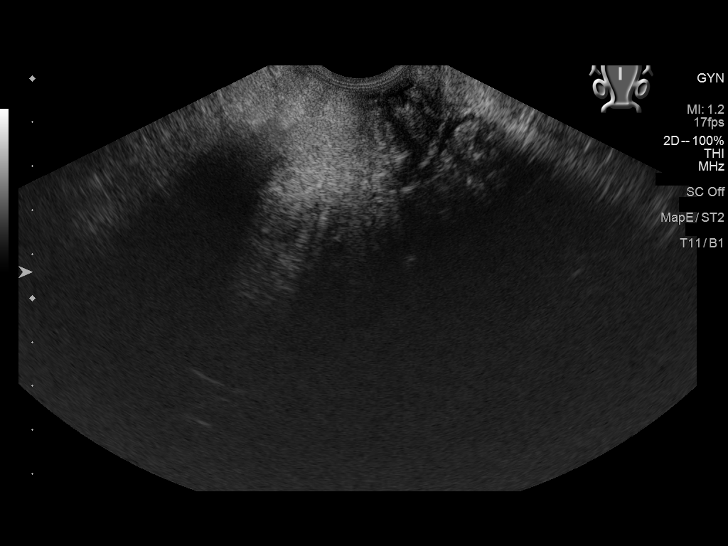
[im 41/82]
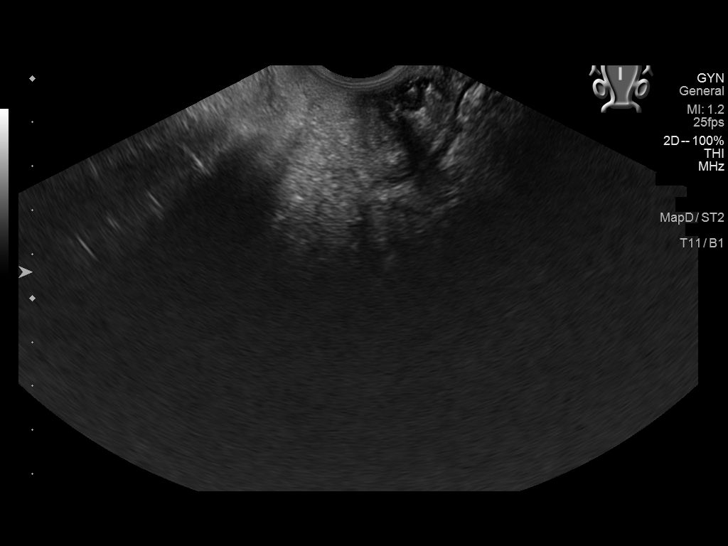
[im 48/82]
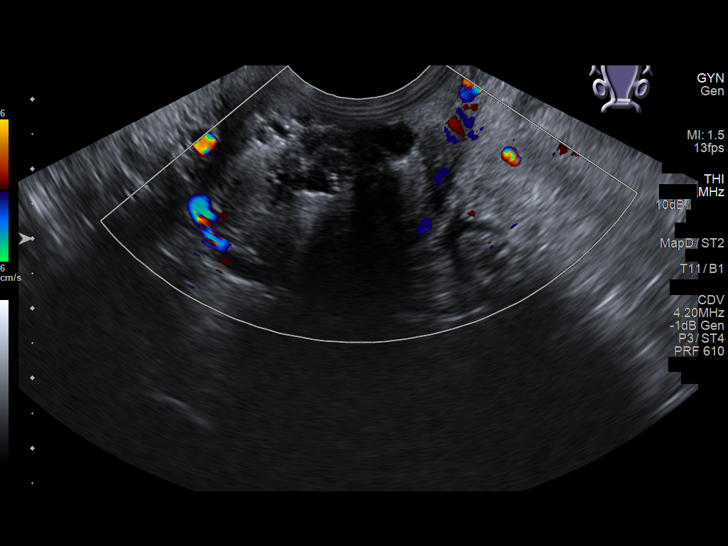
[im 55/82]
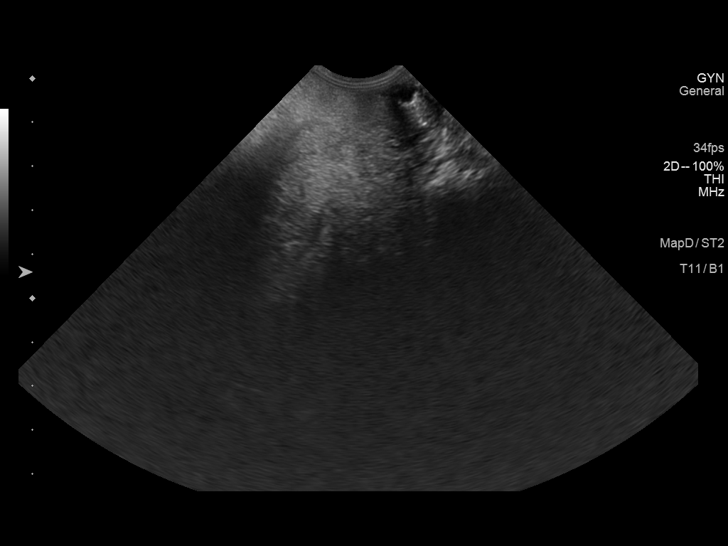
[im 61/82]
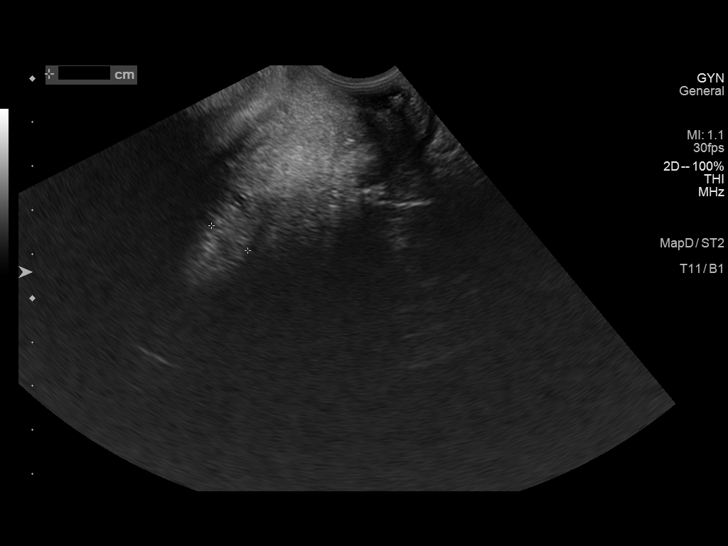
[im 68/82]
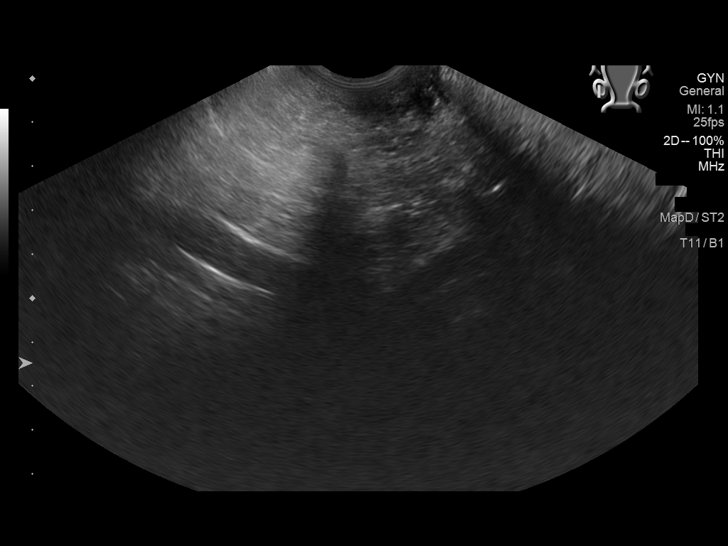
[im 75/82]
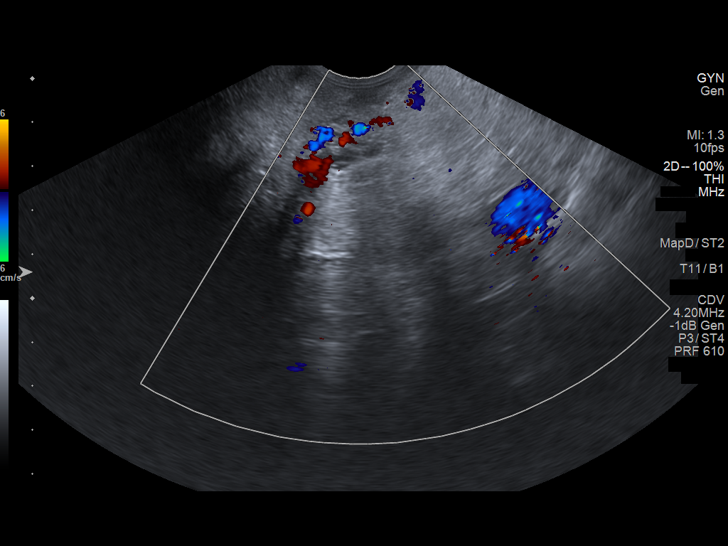
[im 82/82]
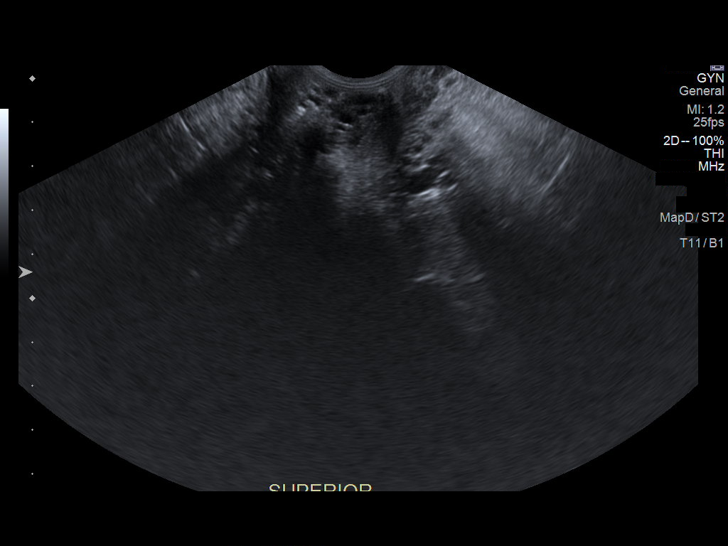

[13 of 25 positions shown; findings below may reference images not displayed]

FINDINGS: Uterus

Measurements: 11.5 x 5.3 x 6.2 cm = volume: 195.1 mL. Poorly
visualized due to habitus and position of the uterus. No definitive
focal mass.

Endometrium

Thickness: 10.8 mm.  No focal abnormality visualized.

Right ovary

Not seen

Left ovary

Not seen

Other findings

No abnormal free fluid.
IMPRESSION: 1. Endometrial thickness of 10.8 mm. In the setting of
post-menopausal bleeding, endometrial sampling is indicated to
exclude carcinoma. If results are benign, sonohysterogram should be
considered for focal lesion work-up. (Ref: Radiological Reasoning:
Algorithmic Workup of Abnormal Vaginal Bleeding with Endovaginal
Sonography and Sonohysterography. AJR [id number]; 191:S68-73)
2. Nonvisualized ovaries

## 2021-05-13 ENCOUNTER — Ambulatory Visit: Payer: Medicare Other

## 2021-05-14 ENCOUNTER — Ambulatory Visit: Payer: Medicaid Other | Admitting: Primary Care

## 2021-06-03 ENCOUNTER — Ambulatory Visit (INDEPENDENT_AMBULATORY_CARE_PROVIDER_SITE_OTHER): Payer: Medicaid Other | Admitting: Vascular Surgery

## 2021-06-03 ENCOUNTER — Encounter (INDEPENDENT_AMBULATORY_CARE_PROVIDER_SITE_OTHER): Payer: Medicaid Other

## 2021-06-20 ENCOUNTER — Ambulatory Visit (INDEPENDENT_AMBULATORY_CARE_PROVIDER_SITE_OTHER): Payer: Medicare Other | Admitting: Vascular Surgery

## 2021-06-20 ENCOUNTER — Encounter (INDEPENDENT_AMBULATORY_CARE_PROVIDER_SITE_OTHER): Payer: Self-pay | Admitting: Vascular Surgery

## 2021-06-20 ENCOUNTER — Ambulatory Visit (INDEPENDENT_AMBULATORY_CARE_PROVIDER_SITE_OTHER): Payer: Medicare Other

## 2021-06-20 VITALS — BP 139/71 | HR 81 | Resp 18 | Ht 62.0 in | Wt 312.0 lb

## 2021-06-20 DIAGNOSIS — I10 Essential (primary) hypertension: Secondary | ICD-10-CM | POA: Diagnosis not present

## 2021-06-20 DIAGNOSIS — I6523 Occlusion and stenosis of bilateral carotid arteries: Secondary | ICD-10-CM | POA: Diagnosis not present

## 2021-06-20 DIAGNOSIS — I890 Lymphedema, not elsewhere classified: Secondary | ICD-10-CM | POA: Diagnosis not present

## 2021-06-20 NOTE — Progress Notes (Signed)
? ? ?MRN : [id number] ? ?Kristie Dorsey is a 61 y.o. ([birth year]) female who presents with chief complaint of No chief complaint on file. ?. ? ?History of Present Illness: Patient returns in follow-up of her carotid disease.  She is doing well today.  She says she is working on her medical risk factors and comorbidities and trying to get healthier.  No focal neurologic symptoms or problems since her last visit.  She is exactly 1 year status post right carotid endarterectomy for high-grade stenosis. Carotid duplex today reveals a widely patent right carotid endarterectomy with mild intimal thickening and stable 1 to 39% left ICA stenosis. ? ?Current Outpatient Medications  ?Medication Sig Dispense Refill  ? albuterol (PROVENTIL HFA;VENTOLIN HFA) 108 (90 Base) MCG/ACT inhaler Inhale 2-4 puffs by mouth every 4 hours as needed for wheezing, cough, and/or shortness of breath (Patient taking differently: Inhale 2 puffs into the lungs every 4 (four) hours as needed for wheezing or shortness of breath. Inhale 2-4 puffs by mouth every 4 hours as needed for wheezing, cough, and/or shortness of breath) 1 Inhaler 1  ? aspirin EC 81 MG EC tablet Take 1 tablet (81 mg total) by mouth daily at 6 (six) AM. Swallow whole. 90 tablet 3  ? atorvastatin (LIPITOR) 10 MG tablet Take 1 tablet (10 mg total) by mouth daily. 30 tablet 6  ? buPROPion (WELLBUTRIN SR) 150 MG 12 hr tablet Take 150 mg by mouth 2 (two) times daily.    ? carvedilol (COREG) 12.5 MG tablet TAKE 1 TABLET BY MOUTH 2 TIMES A DAY WITH MEALS    ? clopidogrel (PLAVIX) 75 MG tablet Take 1 tablet (75 mg total) by mouth daily. 30 tablet 6  ? FLUoxetine (PROZAC) 40 MG capsule Take 40 mg by mouth daily.    ? fluticasone (FLONASE) 50 MCG/ACT nasal spray Place into both nostrils.    ? furosemide (LASIX) 40 MG tablet Take 40 mg by mouth daily.     ? levothyroxine (SYNTHROID, LEVOTHROID) 75 MCG tablet Take 75 mcg by mouth daily before breakfast.     ? megestrol (MEGACE) 40 MG tablet  Take by mouth.    ? meloxicam (MOBIC) 15 MG tablet Take 15 mg by mouth daily.    ? mometasone-formoterol (DULERA) 200-5 MCG/ACT AERO Inhale 2 puffs into the lungs 2 (two) times daily. 13 g 6  ? Multiple Vitamins-Minerals (MULTIVITAMIN WITH MINERALS) tablet Take 1 tablet by mouth daily.    ? OZEMPIC, 0.25 OR 0.5 MG/DOSE, 2 MG/1.5ML SOPN Inject into the skin.    ? sacubitril-valsartan (ENTRESTO) 49-51 MG Take 1 tablet by mouth 2 (two) times daily.    ? SPIRIVA RESPIMAT 2.5 MCG/ACT AERS INHALE 2 PUFFS INTO THE LUNGS DAILY. 4 g 2  ? spironolactone (ALDACTONE) 25 MG tablet Take 25 mg by mouth daily.    ? traZODone (DESYREL) 100 MG tablet Take 100 mg by mouth at bedtime.    ? ?No current facility-administered medications for this visit.  ? ? ?Past Medical History:  ?Diagnosis Date  ? Anginal pain ([place])   ? every once in a while  ? Aortic atherosclerosis ([place])   ? Arthritis   ? Cardiomyopathy ([place])   ? Carotid stenosis   ? CHF (congestive heart failure) ([place])   ? probable, takes HCTZ as a "fluid pill"  ? Complication of anesthesia   ? difficulty waking up after AVR  ? COPD (chronic obstructive pulmonary disease) ([place])   ? probable, no specific diagnosis  ?  Depression   ? take fluoxetine  ? Dyspnea   ? Hypertension   ? Hypothyroidism   ? LBBB (left bundle branch block)   ? Morbid obesity ([place])   ? Nonrheumatic aortic (valve) stenosis   ? s/p TAVR  ? Obesity hypoventilation syndrome ([place])   ? OSA treated with BiPAP   ? Thyroid disease   ? hypothryoidism  ? ? ?Past Surgical History:  ?Procedure Laterality Date  ? CARDIAC VALVE REPLACEMENT  11/2018  ? AVR  ? CHOLECYSTECTOMY    ? ENDARTERECTOMY Right 06/20/2020  ? Procedure: ENDARTERECTOMY CAROTID;  Surgeon: Algernon Huxley, MD;  Location: [facility];  Service: Vascular;  Laterality: Right;  ? HYSTEROSCOPY WITH D & C N/A 02/28/2020  ? Procedure: DILATATION AND CURETTAGE;  Surgeon: Ward, Honor Loh, MD;  Location: [facility];  Service: Gynecology;  Laterality: N/A;  ? LEFT HEART CATH AND  CORONARY ANGIOGRAPHY N/A 04/14/2018  ? Procedure: LEFT HEART CATH AND CORONARY ANGIOGRAPHY;  Surgeon: Yolonda Kida, MD;  Location: [facility] [facility];  Service: Cardiovascular;  Laterality: N/A;  ? TONSILLECTOMY    ? TUBAL LIGATION    ? ? ? ?Social History  ? ?Tobacco Use  ? Smoking status: Former  ?  Packs/day: 1.50  ?  Years: 46.00  ?  Pack years: 69.00  ?  Types: Cigarettes  ?  Start date: 03/02/1973  ?  Quit date: 11/01/2018  ?  Years since quitting: 2.6  ? Smokeless tobacco: Never  ? Tobacco comments:  ?  Vaping daily--05/02/2020  ?Vaping Use  ? Vaping Use: Every day  ? Start date: 11/01/2018  ? Substances: Nicotine  ? Devices: trying to quit  ?Substance Use Topics  ? Alcohol use: No  ? Drug use: No  ? ? ? ? ?Family History  ?Adopted: Yes  ?Problem Relation Age of Onset  ? Aneurysm Mother   ? Cancer Father   ? ? ?No Known Allergies ? ? ?REVIEW OF SYSTEMS (Negative unless checked) ? ?Constitutional: [] Weight loss  [] Fever  [] Chills ?Cardiac: [] Chest pain   [] Chest pressure   [] Palpitations   [] Shortness of breath when laying flat   [] Shortness of breath at rest   [x] Shortness of breath with exertion. ?Vascular:  [] Pain in legs with walking   [] Pain in legs at rest   [] Pain in legs when laying flat   [] Claudication   [] Pain in feet when walking  [] Pain in feet at rest  [] Pain in feet when laying flat   [] History of DVT   [] Phlebitis   [x] Swelling in legs   [] Varicose veins   [] Non-healing ulcers ?Pulmonary:   [] Uses home oxygen   [] Productive cough   [] Hemoptysis   [] Wheeze  [x] COPD   [] Asthma ?Neurologic:  [] Dizziness  [] Blackouts   [] Seizures   [] History of stroke   [] History of TIA  [] Aphasia   [] Temporary blindness   [] Dysphagia   [] Weakness or numbness in arms   [] Weakness or numbness in legs ?Musculoskeletal:  [] Arthritis   [] Joint swelling   [] Joint pain   [] Low back pain ?Hematologic:  [] Easy bruising  [] Easy bleeding   [] Hypercoagulable state   [] Anemic  [] Hepatitis ?Gastrointestinal:  [] Blood in stool    [] Vomiting blood  [] Gastroesophageal reflux/heartburn   [] Difficulty swallowing. ?Genitourinary:  [] Chronic kidney disease   [] Difficult urination  [] Frequent urination  [] Burning with urination   [] Blood in urine ?Skin:  [] Rashes   [] Ulcers   [] Wounds ?Psychological:  [] History of anxiety   []  History of  major depression. ? ?Physical Examination ? ?Vitals:  ? 06/20/21 0908  ?BP: 139/71  ?Pulse: 81  ?Resp: 18  ?Weight: (!) 312 lb (141.5 kg)  ?Height: 5\' 2"  (1.575 m)  ? ?Body mass index is 57.07 kg/m?. ?Gen:  WD/WN, NAD. Morbidly obese ?Head: [place]/AT, No temporalis wasting. ?Ear/Nose/Throat: Hearing grossly intact, nares w/o erythema or drainage, trachea midline ?Eyes: Conjunctiva clear. Sclera non-icteric ?Neck: Supple.  No bruit  ?Pulmonary:  Good air movement, equal and clear to auscultation bilaterally.  ?Cardiac: RRR, No JVD ?Vascular:  ?Vessel Right Left  ?Radial Palpable Palpable  ?    ?    ?    ? ?Musculoskeletal: M/S 5/5 throughout.  No deformity or atrophy. Mild LE edema. ?Neurologic: CN 2-12 intact. Sensation grossly intact in extremities.  Symmetrical.  Speech is fluent. Motor exam as listed above. ?Psychiatric: Judgment intact, Mood & affect appropriate for pt's clinical situation. ?Dermatologic: No rashes or ulcers noted.  No cellulitis or open wounds. ? ? ? ? ?CBC ?Lab Results  ?Component Value Date  ? WBC 8.6 06/21/2020  ? HGB 11.6 (L) 06/21/2020  ? HCT 35.7 (L) 06/21/2020  ? MCV 88.1 06/21/2020  ? PLT 185 06/21/2020  ? ? ?BMET ?   ?Component Value Date/Time  ? NA 140 06/21/2020 0330  ? NA 136 02/27/2013 1054  ? K 4.4 06/21/2020 0330  ? K 4.2 02/27/2013 1054  ? CL 107 06/21/2020 0330  ? CL 105 02/27/2013 1054  ? CO2 25 06/21/2020 0330  ? CO2 32 02/27/2013 1054  ? GLUCOSE 160 (H) 06/21/2020 0330  ? GLUCOSE 100 (H) 02/27/2013 1054  ? BUN 12 06/21/2020 0330  ? BUN 11 02/27/2013 1054  ? CREATININE 1.06 (H) 06/21/2020 0330  ? CREATININE 0.72 02/27/2013 1054  ? CALCIUM 8.8 (L) 06/21/2020 0330  ? CALCIUM 9.1  02/27/2013 1054  ? GFRNONAA >60 06/21/2020 0330  ? GFRNONAA >60 02/27/2013 1054  ? GFRAA >60 09/22/2019 1141  ? GFRAA >60 02/27/2013 1054  ? ?CrCl cannot be calculated (Patient's most recent lab resu

## 2021-06-20 NOTE — Assessment & Plan Note (Signed)
Symptom control is reasonable.  Weight loss, elevation, and compression of benefit for reducing swelling. ?

## 2021-06-20 NOTE — Assessment & Plan Note (Signed)
blood pressure control important in reducing the progression of atherosclerotic disease. On appropriate oral medications.  

## 2021-06-20 NOTE — Assessment & Plan Note (Signed)
Carotid duplex today reveals a widely patent right carotid endarterectomy with mild intimal thickening and stable 1 to 39% left ICA stenosis.  Doing well.  We are 1 year status post surgeries we can go to an annual follow-up.  Continue current medical regimen. ?

## 2021-06-23 ENCOUNTER — Ambulatory Visit (INDEPENDENT_AMBULATORY_CARE_PROVIDER_SITE_OTHER): Payer: Medicare Other | Admitting: Primary Care

## 2021-06-23 ENCOUNTER — Encounter: Payer: Self-pay | Admitting: Primary Care

## 2021-06-23 DIAGNOSIS — J9611 Chronic respiratory failure with hypoxia: Secondary | ICD-10-CM | POA: Diagnosis not present

## 2021-06-23 DIAGNOSIS — J449 Chronic obstructive pulmonary disease, unspecified: Secondary | ICD-10-CM | POA: Insufficient documentation

## 2021-06-23 DIAGNOSIS — G4733 Obstructive sleep apnea (adult) (pediatric): Secondary | ICD-10-CM | POA: Diagnosis not present

## 2021-06-23 DIAGNOSIS — J410 Simple chronic bronchitis: Secondary | ICD-10-CM

## 2021-06-23 MED ORDER — BREZTRI AEROSPHERE 160-9-4.8 MCG/ACT IN AERO: 2.0000 | INHALATION_SPRAY | Freq: Two times a day (BID) | RESPIRATORY_TRACT | 0 refills | Status: DC

## 2021-06-23 MED ORDER — AEROCHAMBER MV MISC: 0 refills | Status: AC

## 2021-06-23 NOTE — Assessment & Plan Note (Signed)
-   Continue 3L supplemental O2 at night blended into BIPAP. No daytime requirements  ?

## 2021-06-23 NOTE — Assessment & Plan Note (Signed)
-   Patient is 100% compliant with BIPAP use. NO issues with mask fit or pressure settings.  ?- Current pressure 18/8cm h20; residual AHI 0.5 ?- No changes today ?- Encourage weight loss efforts and advised against driving if experiencing excessive daytime sleepiness  ?- FU in 6 months or sooner if needed  ?

## 2021-06-23 NOTE — Patient Instructions (Signed)
Great work on wearing your BiPAP download shows excellent compliance and control of your sleep apnea ? ?Recommendations ?Stop Elwin Sleight and Spiriva tomorrow ?Start Breztri on 06/24/21 - take 2 puffs morning and evening (rinse mouth after use) ?Let us know if you prefer Breztri inhaler and we can send a prescription in ?Continue to wear BiPAP while sleeping with oxygen  ?Take Mucinex as needed for congestion ?Notify us if you develop increased sputum production or purulent mucus ? ?Follow-up ?6 months with Dr. Craige Cotta or Waynetta Sandy NP ?

## 2021-06-23 NOTE — Assessment & Plan Note (Signed)
-   Patient has morning cough with mucus production and dyspnea with exertion. COPD is not acutely exacerbated. Lungs were clear on exam. She is currently on Dulera and Spiriva respimat. Recommend trial Breztri Aerosphere two puffs twice daily with spacer. Reviewed proper inhaler techniques.  She will let us know if she wants RX sent. FU in 6 months or sooner if needed.  ?

## 2021-06-23 NOTE — Progress Notes (Signed)
Reviewed and agree with assessment/plan. ? ? ?Jessah Danser, MD ?[facility] ?06/23/2021, 1:36 PM ?Pager:  [phone] ? ?

## 2021-06-23 NOTE — Progress Notes (Addendum)
? ?@Patient  ID: Kristie SquiresJudy M Dorsey, female    DOB: [birth year], 61 y.o.   MRN: [id number] ? ?Chief Complaint  ?Patient presents with  ? Follow-up  ?  Wearing bipap avg 8-10hr nightly--feels pressure and mask is okay. C/o sob with exertion and prod cough with white sputum in the morning.   ? ? ?Referring provider: ?Oswaldo ConroyBender, Abby Daneele, MD ? ?HPI: ?Kristie SquiresJudy M Dorsey is a 61 y.o. female former smoker with COPD from emphysema, obstructive sleep apnea, obesity hypoventilation syndrome, and chronic respiratory failure with hypoxia. Patent of Dr. Craige CottaSood, previously seen by Dr. Jayme CloudGonzalez.  ? ?Previous [facility] pulmonary encounter: ?07/04/20- Dr. Craige CottaSood ? ?Uses Bipap on regular basis.  Has full face mask.  Has to make mask tight to prevent leak, but then gets marks on her face.  Sleeping much better since starting Bipap. ? ?Not having cough, wheeze, or sputum. ? ?Uses 3 liters oxygen with exertion and at night with Bipap. ? ?Had low dose CT chest in January.  Showed small nodule Rt upper lobe. ? ?She had Rt carotid surgery a few weeks ago. ? ?Auto Bipap download showed average AHI 0.8 with median Bipap 14/10 and 95 th percentile Bipap 18/14 cm H2O.  She did have significant air leak. ? ?06/23/2021- Interim hx  ?Patient presents today for 1 year follow-up for COPD, OSA/OHS, chronic respiratory failure. Accompanied by her daughter. She is doing very well today. She has a productive cough most mornings and dyspnea with exertion. She does not feel Elwin SleightDulera works well for her. BIPAP download showes 100% compliance with use. No issues with mask fit or pressure settings. Continues to wear 3L oxygen at night. No daytime use, only rarely PRN.  ? ?Airview download 05/19/20-06/17/21 ?Usage 30/30 days; 100% > 4 hours ?Average usage 10 hours 6 mins ?Pressure 18/8/4 (16/11.9cm h20- 95%) ?Airleaks 3.4L/min ?AHI 0.5  ? ?No Known Allergies ? ?Immunization History  ?Administered Date(s) Administered  ? Influenza,inj,Quad PF,6+ Mos 01/11/2020  ? Moderna Sars-Covid-2  Vaccination 11/14/2019, 12/12/2019  ? ? ?Past Medical History:  ?Diagnosis Date  ? Anginal pain (HCC)   ? every once in a while  ? Aortic atherosclerosis (HCC)   ? Arthritis   ? Cardiomyopathy (HCC)   ? Carotid stenosis   ? CHF (congestive heart failure) (HCC)   ? probable, takes HCTZ as a "fluid pill"  ? Complication of anesthesia   ? difficulty waking up after AVR  ? COPD (chronic obstructive pulmonary disease) (HCC)   ? probable, no specific diagnosis  ? Depression   ? take fluoxetine  ? Dyspnea   ? Hypertension   ? Hypothyroidism   ? LBBB (left bundle branch block)   ? Morbid obesity (HCC)   ? Nonrheumatic aortic (valve) stenosis   ? s/p TAVR  ? Obesity hypoventilation syndrome (HCC)   ? OSA treated with BiPAP   ? Thyroid disease   ? hypothryoidism  ? ? ?Tobacco History: ?Social History  ? ?Tobacco Use  ?Smoking Status Every Day  ? Packs/day: 1.50  ? Years: 46.00  ? Pack years: 69.00  ? Types: Cigarettes  ? Start date: 03/02/1973  ? Last attempt to quit: 11/01/2018  ? Years since quitting: 2.6  ?Smokeless Tobacco Never  ?Tobacco Comments  ? 5 cigarettes daily--06/23/2021   ? ?Ready to quit: Not Answered ?Counseling given: Not Answered ?Tobacco comments: 5 cigarettes daily--06/23/2021  ? ? ?Outpatient Medications Prior to Visit  ?Medication Sig Dispense Refill  ? albuterol (PROVENTIL HFA;VENTOLIN HFA) 108 (90 Base)  MCG/ACT inhaler Inhale 2-4 puffs by mouth every 4 hours as needed for wheezing, cough, and/or shortness of breath (Patient taking differently: Inhale 2 puffs into the lungs every 4 (four) hours as needed for wheezing or shortness of breath. Inhale 2-4 puffs by mouth every 4 hours as needed for wheezing, cough, and/or shortness of breath) 1 Inhaler 1  ? aspirin EC 81 MG EC tablet Take 1 tablet (81 mg total) by mouth daily at 6 (six) AM. Swallow whole. 90 tablet 3  ? atorvastatin (LIPITOR) 10 MG tablet Take 1 tablet (10 mg total) by mouth daily. 30 tablet 6  ? buPROPion (WELLBUTRIN SR) 150 MG 12 hr tablet Take  150 mg by mouth 2 (two) times daily.    ? carvedilol (COREG) 12.5 MG tablet TAKE 1 TABLET BY MOUTH 2 TIMES A DAY WITH MEALS    ? clopidogrel (PLAVIX) 75 MG tablet Take 1 tablet (75 mg total) by mouth daily. 30 tablet 6  ? FLUoxetine (PROZAC) 40 MG capsule Take 40 mg by mouth daily.    ? fluticasone (FLONASE) 50 MCG/ACT nasal spray Place into both nostrils.    ? furosemide (LASIX) 40 MG tablet Take 40 mg by mouth daily.     ? levothyroxine (SYNTHROID, LEVOTHROID) 75 MCG tablet Take 75 mcg by mouth daily before breakfast.     ? megestrol (MEGACE) 40 MG tablet Take by mouth.    ? meloxicam (MOBIC) 15 MG tablet Take 15 mg by mouth daily.    ? Multiple Vitamins-Minerals (MULTIVITAMIN WITH MINERALS) tablet Take 1 tablet by mouth daily.    ? OZEMPIC, 0.25 OR 0.5 MG/DOSE, 2 MG/1.5ML SOPN Inject into the skin.    ? sacubitril-valsartan (ENTRESTO) 49-51 MG Take 1 tablet by mouth 2 (two) times daily.    ? spironolactone (ALDACTONE) 25 MG tablet Take 25 mg by mouth daily.    ? traZODone (DESYREL) 100 MG tablet Take 100 mg by mouth at bedtime.    ? mometasone-formoterol (DULERA) 200-5 MCG/ACT AERO Inhale 2 puffs into the lungs 2 (two) times daily. 13 g 6  ? SPIRIVA RESPIMAT 2.5 MCG/ACT AERS INHALE 2 PUFFS INTO THE LUNGS DAILY. 4 g 2  ? ?No facility-administered medications prior to visit.  ? ?Review of Systems ? ?Review of Systems  ?Constitutional: Negative.   ?HENT: Negative.    ?Respiratory:  Positive for cough and shortness of breath.   ?Cardiovascular: Negative.  Negative for leg swelling.  ? ? ?Physical Exam ? ?BP 128/74 (BP Location: Right Wrist, Cuff Size: Large)   Pulse 75   Temp 97.8 ?F (36.6 ?C) (Temporal)   Ht 5\' 2"  (1.575 m)   Wt (!) 320 lb (145.2 kg)   LMP 02/21/2020   SpO2 96%   BMI 58.53 kg/m?  ?Physical Exam ?Constitutional:   ?   General: She is not in acute distress. ?   Appearance: Normal appearance. She is obese. She is not ill-appearing.  ?HENT:  ?   Head: Normocephalic and atraumatic.  ?    Mouth/Throat:  ?   Mouth: Mucous membranes are moist.  ?   Pharynx: Oropharynx is clear.  ?Cardiovascular:  ?   Rate and Rhythm: Normal rate and regular rhythm.  ?Pulmonary:  ?   Effort: Pulmonary effort is normal.  ?   Breath sounds: Normal breath sounds. No wheezing, rhonchi or rales.  ?Musculoskeletal:     ?   General: Normal range of motion.  ?   Comments: In transport chair   ?Skin: ?  General: Skin is warm and dry.  ?Neurological:  ?   General: No focal deficit present.  ?   Mental Status: She is alert and oriented to person, place, and time. Mental status is at baseline.  ?Psychiatric:     ?   Mood and Affect: Mood normal.     ?   Behavior: Behavior normal.     ?   Thought Content: Thought content normal.     ?   Judgment: Judgment normal.  ?  ? ?Lab Results: ? ?CBC ?   ?Component Value Date/Time  ? WBC 8.6 06/21/2020 0330  ? RBC 4.05 06/21/2020 0330  ? HGB 11.6 (L) 06/21/2020 0330  ? HGB 13.9 02/27/2013 1054  ? HCT 35.7 (L) 06/21/2020 0330  ? HCT 41.9 02/27/2013 1054  ? PLT 185 06/21/2020 0330  ? PLT 156 02/27/2013 1054  ? MCV 88.1 06/21/2020 0330  ? MCV 86 02/27/2013 1054  ? MCH 28.6 06/21/2020 0330  ? MCHC 32.5 06/21/2020 0330  ? RDW 13.9 06/21/2020 0330  ? RDW 13.7 02/27/2013 1054  ? LYMPHSABS 1.4 06/18/2020 1059  ? LYMPHSABS 2.0 02/27/2013 1054  ? MONOABS 0.5 06/18/2020 1059  ? MONOABS 0.6 02/27/2013 1054  ? EOSABS 0.3 06/18/2020 1059  ? EOSABS 0.5 02/27/2013 1054  ? BASOSABS 0.0 06/18/2020 1059  ? BASOSABS 0.1 02/27/2013 1054  ? ? ?BMET ?   ?Component Value Date/Time  ? NA 140 06/21/2020 0330  ? NA 136 02/27/2013 1054  ? K 4.4 06/21/2020 0330  ? K 4.2 02/27/2013 1054  ? CL 107 06/21/2020 0330  ? CL 105 02/27/2013 1054  ? CO2 25 06/21/2020 0330  ? CO2 32 02/27/2013 1054  ? GLUCOSE 160 (H) 06/21/2020 0330  ? GLUCOSE 100 (H) 02/27/2013 1054  ? BUN 12 06/21/2020 0330  ? BUN 11 02/27/2013 1054  ? CREATININE 1.06 (H) 06/21/2020 0330  ? CREATININE 0.72 02/27/2013 1054  ? CALCIUM 8.8 (L) 06/21/2020 0330  ?  CALCIUM 9.1 02/27/2013 1054  ? GFRNONAA >60 06/21/2020 0330  ? GFRNONAA >60 02/27/2013 1054  ? GFRAA >60 09/22/2019 1141  ? GFRAA >60 02/27/2013 1054  ? ? ?BNP ?   ?Component Value Date/Time  ? BNP 54.4 09/22/2019 1141  ?

## 2021-07-03 ENCOUNTER — Other Ambulatory Visit (INDEPENDENT_AMBULATORY_CARE_PROVIDER_SITE_OTHER): Payer: Self-pay | Admitting: Nurse Practitioner

## 2021-08-04 ENCOUNTER — Other Ambulatory Visit (INDEPENDENT_AMBULATORY_CARE_PROVIDER_SITE_OTHER): Payer: Self-pay | Admitting: Nurse Practitioner

## 2021-08-06 ENCOUNTER — Other Ambulatory Visit (INDEPENDENT_AMBULATORY_CARE_PROVIDER_SITE_OTHER): Payer: Self-pay

## 2021-08-06 MED ORDER — ATORVASTATIN CALCIUM 10 MG PO TABS: 10.0000 mg | ORAL_TABLET | Freq: Every day | ORAL | 0 refills | Status: DC

## 2021-08-07 ENCOUNTER — Other Ambulatory Visit (INDEPENDENT_AMBULATORY_CARE_PROVIDER_SITE_OTHER): Payer: Self-pay | Admitting: Nurse Practitioner

## 2021-08-10 IMAGING — CT CT CHEST LUNG CANCER SCREENING LOW DOSE W/O CM
2 of 5 series · 15 of 40 positions shown, 18 images · non-contrast
Comparison: 10/13/2019 chest CT angiogram.

CLINICAL DATA: 59-year-old asymptomatic female former smoker with
46 pack-year smoking history, quit smoking 1 year prior.

EXAM:
CT CHEST WITHOUT CONTRAST LOW-DOSE FOR LUNG CANCER SCREENING
TECHNIQUE: Multidetector CT imaging of the chest was performed following the
standard protocol without IV contrast.

[Series 3: lung 1.00 · axial · 0.78mm/px · z∈[-1211,-900]mm · 12 of 343 slices shown, 15 images]
[im 16/343  mediastinal]
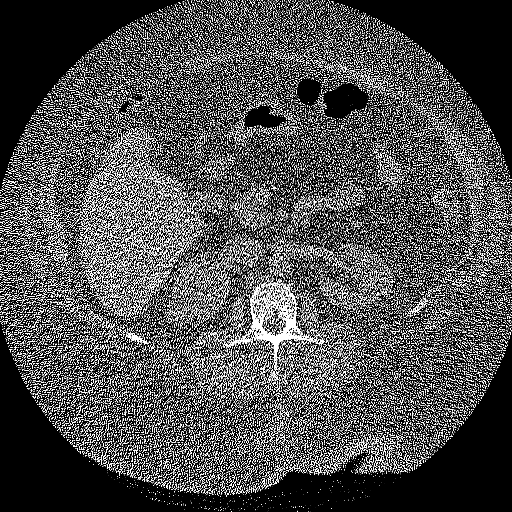
[im 16/343  lung]
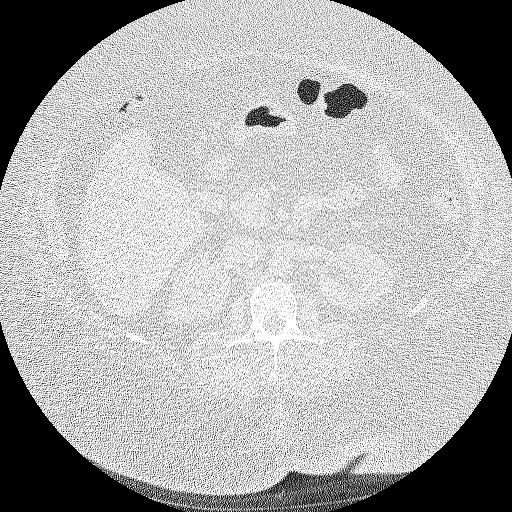
[im 47/343  lung]
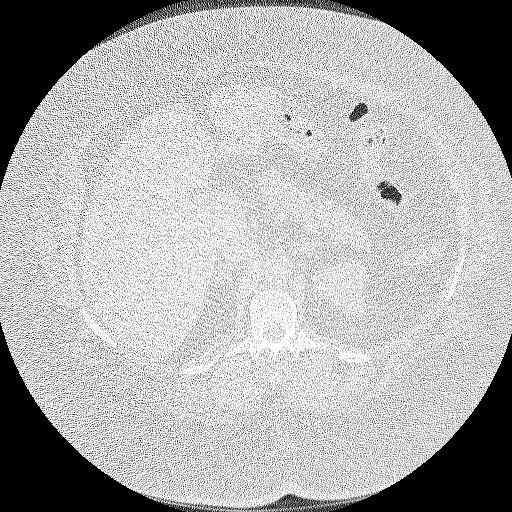
[im 94/343  lung]
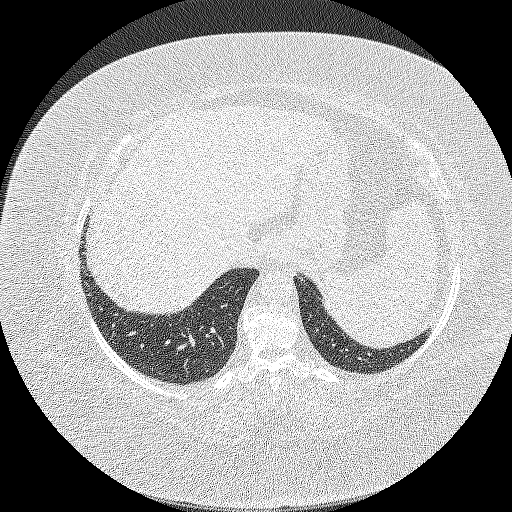
[im 109/343  lung]
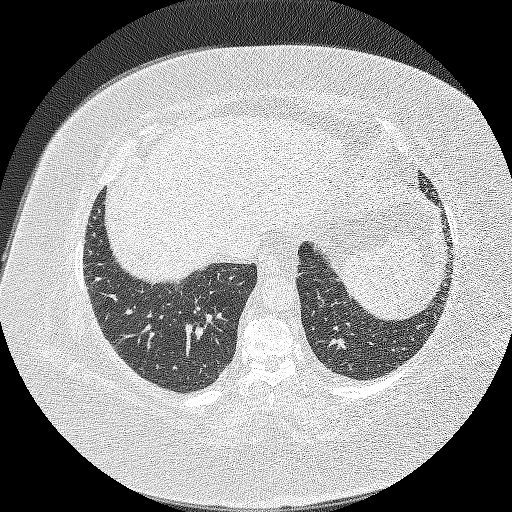
[im 140/343  mediastinal]
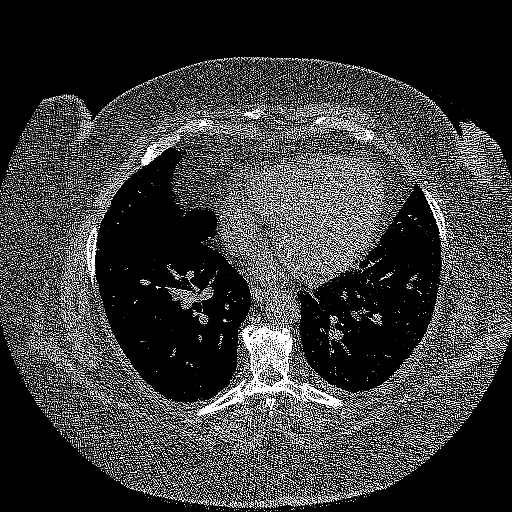
[im 140/343  lung]
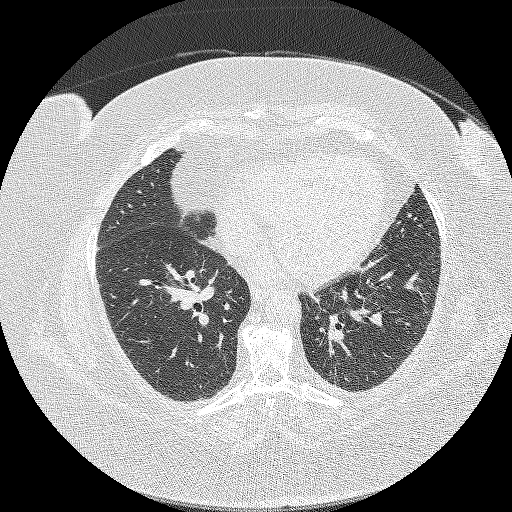
[im 172/343  lung]
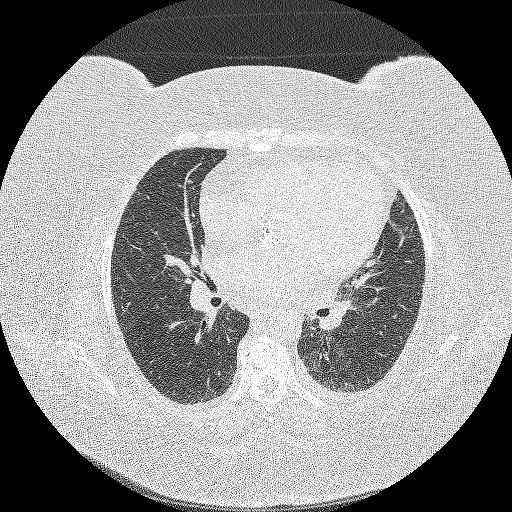
[im 187/343  lung]
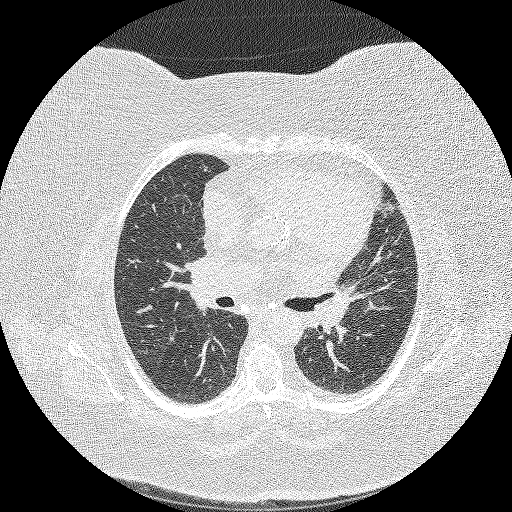
[im 218/343  lung]
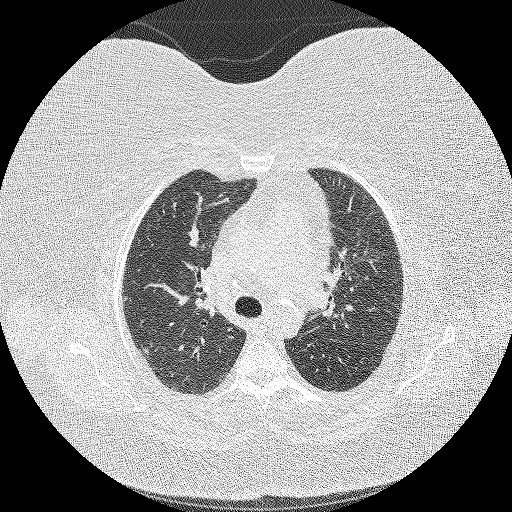
[im 249/343  mediastinal]
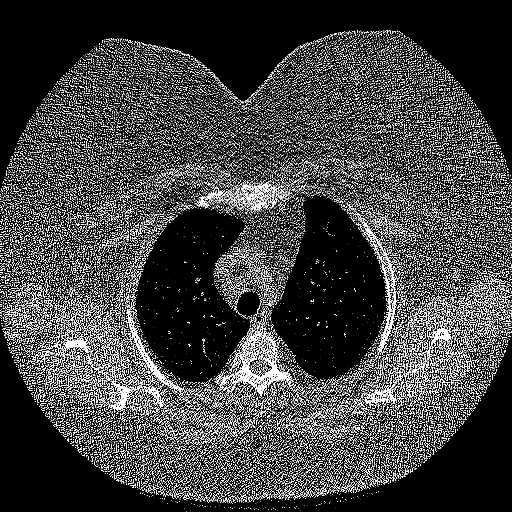
[im 249/343  lung]
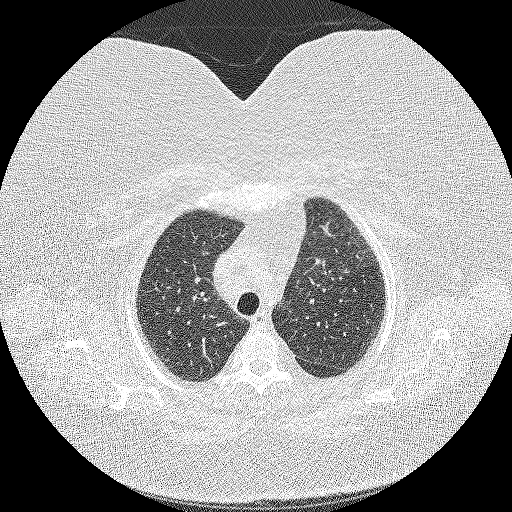
[im 265/343  lung]
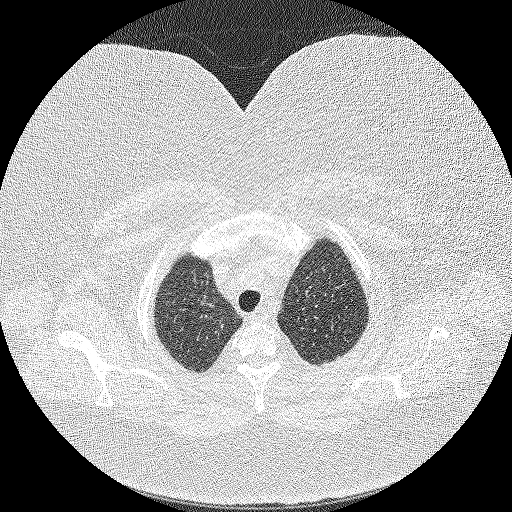
[im 296/343  lung]
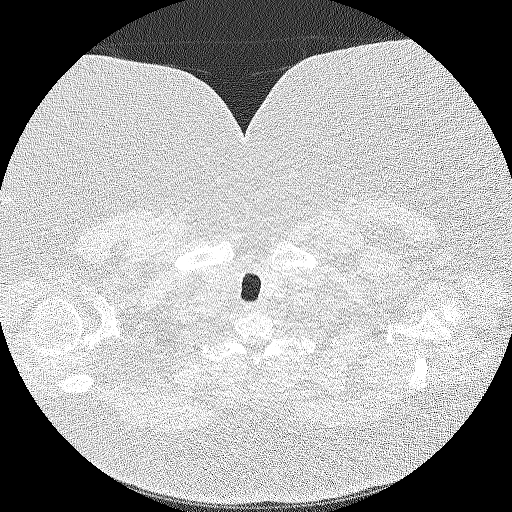
[im 327/343  lung]
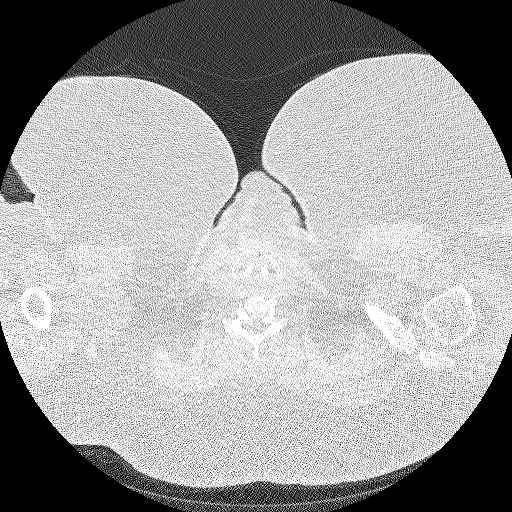

[Series 4: coronals lung 1.00 cor · coronal · 0.67mm/px · 3 of 376 slices shown]
[im 76/376  lung]
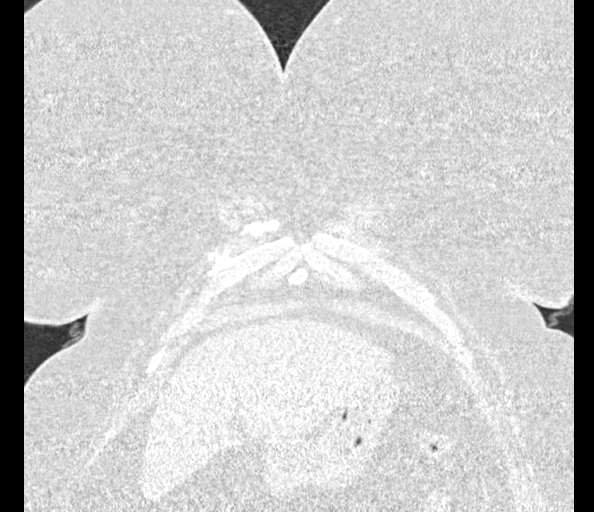
[im 151/376  lung]
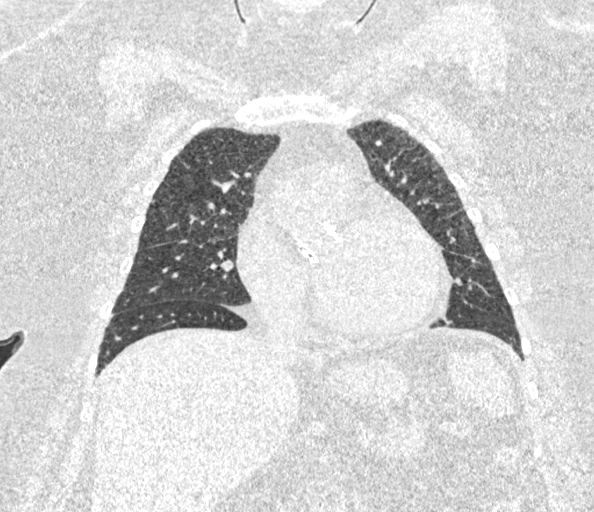
[im 226/376  lung]
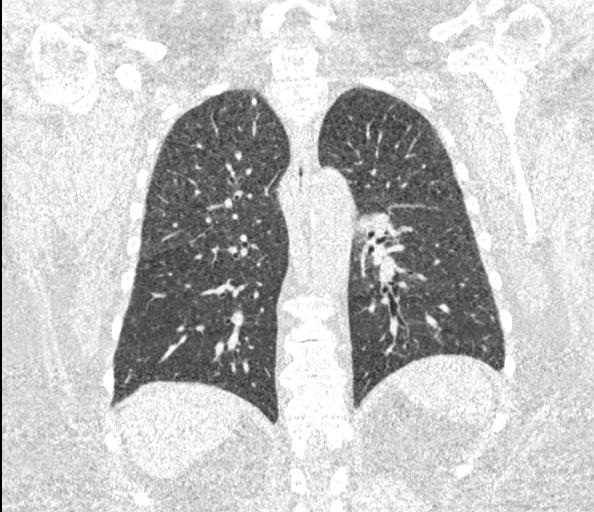

[15 of 40 positions shown; findings below may reference images not displayed]

FINDINGS: Cardiovascular: Borderline mild cardiomegaly, stable. Aortic valve
prosthesis in place. No significant pericardial effusion/thickening.
Atherosclerotic nonaneurysmal thoracic aorta. Top-normal caliber
main pulmonary artery (3.1 cm diameter).

Mediastinum/Nodes: No discrete thyroid nodules. Unremarkable
esophagus. No axillary adenopathy. Coarsely calcified enlarged right
paratracheal node is stable. Mild left prevascular adenopathy up to
1.1 cm, noncalcified (series 2/image 22), stable. Nonenlarged
coarsely calcified subcarinal and bilateral hilar nodes are stable
and compatible with prior granulomatous disease.

Lungs/Pleura: No pneumothorax. Trace dependent left pleural
effusion. No right pleural effusion. Mild centrilobular and
paraseptal emphysema with diffuse bronchial wall thickening. No
acute consolidative airspace disease or lung masses. Peripheral
right upper lobe solid pulmonary nodule measuring 2.8 mm in volume
derived mean diameter (series 3/image 126). No additional
significant solid pulmonary nodules. Numerous calcified granulomas
in both lungs.

Upper abdomen: Cholecystectomy.

Musculoskeletal: No aggressive appearing focal osseous lesions.
Marked thoracic spondylosis.
IMPRESSION: 1. Lung-RADS 2, benign appearance or behavior. Lung-RADS 2, benign
appearance or behavior. Continue annual screening with low-dose
chest CT without contrast in 12 months.
2. Trace dependent left pleural effusion.
3. Stable borderline mild cardiomegaly.
4. Aortic Atherosclerosis (X6LQE-HM5.5) and Emphysema (X6LQE-2IL.D).

## 2021-08-14 ENCOUNTER — Telehealth: Payer: Self-pay

## 2021-08-14 MED ORDER — SPIRIVA RESPIMAT 2.5 MCG/ACT IN AERS
2.0000 | INHALATION_SPRAY | Freq: Every day | RESPIRATORY_TRACT | 6 refills | Status: DC
Start: 2021-08-14 — End: 2022-12-09

## 2021-08-14 MED ORDER — DULERA 200-5 MCG/ACT IN AERO: 2.0000 | INHALATION_SPRAY | Freq: Two times a day (BID) | RESPIRATORY_TRACT | 6 refills | Status: DC

## 2021-08-14 NOTE — Telephone Encounter (Signed)
Patient is aware of below message and voiced her understanding.  Kristie Dorsey and Spiriva has been sent to preferred pharmacy. Nothing further needed.

## 2021-08-14 NOTE — Telephone Encounter (Signed)
Received Rx request from [facility] for spiriva and Dulera. According to last OV note, patient was switched to [name].   Spoke to patient. She stated that she switched back to [name] and [name], as she did not feel that [place] worked as well.  Dr. Jayme Cloud, please advise. Thanks

## 2021-08-14 NOTE — Telephone Encounter (Signed)
Okay to switch back to [facility] 200, 2 puffs twice a day and Spiriva 2.5 mcg, 2 puffs once daily.

## 2021-09-04 ENCOUNTER — Other Ambulatory Visit (INDEPENDENT_AMBULATORY_CARE_PROVIDER_SITE_OTHER): Payer: Self-pay | Admitting: Nurse Practitioner

## 2021-09-15 NOTE — H&P (Signed)
Kristie Dorsey is a 61 y.o. female here for Fx D+C , H/S and myosure resection of an endometrial polyp Follow up for PMB   . Pt underwent a workup 12/21 for PMB and underwent a Fx D+C ( no h/s ) results proliferative . She was placed on 40 mg Megace and bleeding stabilized and then starting 5 months ago it has been intermittent . Repeat u/s 2/22 still showed endometrial stripe of 11 mm  No PAin . G9 P8  All svd   Embx 03/24/21:  BENIGN ENDOMETRIUM WITH  INACTIVE GLANDS AND DECIDUALIZED STROMA COMPATIBLE WITH  PROGESTIN EFFECT.  NO HYPERPLASIA OR CARCINOMA.    Past Medical History:  has a past medical history of Aortic valve stenosis with insufficiency, COPD (chronic obstructive pulmonary disease) (CMS-HCC), Depression, Heart disease, Hypertension, Morbid obesity (CMS-HCC), OSA (obstructive sleep apnea), and Thyroid disease.  Past Surgical History:  has a past surgical history that includes Tubal ligation (1997); Replacement Aortic Valve (11/2018); and Dilation and curettage of uterus (02/28/2020). Family History: family history includes Cancer in her sister. She was adopted. Social History:  reports that she has quit smoking. She has never used smokeless tobacco. She reports that she does not currently use alcohol. She reports that she does not currently use drugs. OB/GYN History:  OB History       Gravida  8   Para  8   Term  8   Preterm      AB      Living  8        SAB      IAB      Ectopic      Molar      Multiple      Live Births  8             Allergies: has No Known Allergies. Medications:   Current Outpatient Medications:    albuterol 90 mcg/actuation inhaler, Inhale 2-4 puffs by mouth every 4 hours as needed for wheezing, cough, and/or shortness of breath, Disp: , Rfl:    atorvastatin (LIPITOR) 10 MG tablet, Take 10 mg by mouth once daily, Disp: , Rfl:    buPROPion (WELLBUTRIN XL) 150 MG XL tablet, Take by mouth, Disp: , Rfl:    carvediloL (COREG) 12.5 MG  tablet, TAKE 1 TABLET BY MOUTH 2 TIMES A DAY WITH MEALS, Disp: 60 tablet, Rfl: 5   clopidogreL (PLAVIX) 75 mg tablet, , Disp: , Rfl:    dapagliflozin propanediol (FARXIGA) 10 mg tablet, Take 1 tablet (10 mg total) by mouth once daily, Disp: 30 tablet, Rfl: 3   ENTRESTO 97-103 mg tablet, TAKE 1 TABLET BY MOUTH 2 TIMES A DAY, Disp: 60 tablet, Rfl: 2   ferrous sulfate 325 (65 FE) MG EC tablet, Take 1 tablet (325 mg total) by mouth daily with breakfast, Disp: 30 tablet, Rfl: 6   FLUoxetine (PROZAC) 40 MG capsule, Take by mouth, Disp: , Rfl:    fluticasone propionate (FLONASE) 50 mcg/actuation nasal spray, Place into one nostril, Disp: , Rfl:    FUROsemide (LASIX) 40 MG tablet, TAKE 1 TABLET BY MOUTH ONCE A DAY, Disp: 60 tablet, Rfl: 5   inhalational spacer (AEROCHAMBER MV) spacer, Use as instructed, Disp: , Rfl:    levothyroxine (SYNTHROID) 75 MCG tablet, Take by mouth, Disp: , Rfl:    megestroL (MEGACE) 40 MG tablet, Take 1 tablet (40 mg total) by mouth once daily, Disp: 30 tablet, Rfl: 0   meloxicam (MOBIC) 15 MG tablet, Take  15 mg by mouth once daily, Disp: , Rfl:    mometasone-formoterol (DULERA) 200-5 mcg/actuation inhaler, Inhale into the lungs, Disp: , Rfl:    multivitamin with minerals tablet, Take 1 tablet by mouth once daily, Disp: , Rfl:    OZEMPIC 0.25 mg or 0.5 mg(2 mg/1.5 mL) pen injector, , Disp: , Rfl:    spironolactone (ALDACTONE) 25 MG tablet, Take 1 tablet (25 mg total) by mouth once daily, Disp: 90 tablet, Rfl: 2   topiramate (TOPAMAX) 25 MG tablet, Take 1 tablet by mouth in the evening times 2 weeks then 2 Tablets by mouth in the evening., Disp: , Rfl:    traZODone (DESYREL) 100 MG tablet, Take by mouth, Disp: , Rfl:    aspirin 81 MG EC tablet, Take 1 tablet (81 mg total) by mouth once daily, Disp: 30 tablet, Rfl: 11   OZEMPIC 1 mg/dose (4 mg/3 mL) pen injector, , Disp: , Rfl:    tiotropium bromide (SPIRIVA RESPIMAT) 2.5 mcg/actuation inhalation spray, Inhale into the lungs,  Disp: , Rfl:    Review of Systems: General:                      No fatigue or weight loss Eyes:                           No vision changes Ears:                            No hearing difficulty Respiratory:                No cough or shortness of breath Pulmonary:                  No asthma or shortness of breath Cardiovascular:           No chest pain, palpitations, dyspnea on exertion Gastrointestinal:          No abdominal bloating, chronic diarrhea, constipations, masses, pain or hematochezia Genitourinary:             No hematuria, dysuria, abnormal vaginal discharge, pelvic pain, Menometrorrhagia Lymphatic:                   No swollen lymph nodes Musculoskeletal:         No muscle weakness Neurologic:                  No extremity weakness, syncope, seizure disorder Psychiatric:                  No history of depression, delusions or suicidal/homicidal ideation      Exam:       Vitals:    09/09/21 0901  BP: (!) 130/90  Pulse: 87      Body mass index is 57.07 kg/m.   WDWN white/  female in NAD   Lungs: CTA  CV : RRR without murmur   Abdomen: soft , no mass, normal active bowel sounds,  non-tender, no rebound tenderness Pelvic: tanner stage 5 ,  External genitalia: vulva /labia no lesions Urethra: no prolapse Vagina: normal physiologic d/c Cervix: no lesions, no cervical motion tenderness   Uterus: normal size shape and contour, non-tender Adnexa: no mass,  non-tender   Rectovaginal:  Saline infusion sonohysterography: betadine prep to the cervix followed by placement of the HSG catheter into the endometrial canal . Sterile  H2O is injected while performing a transvaginal u/s . Findings:    Saline  [place] Endometrial polyp= 2.01 x 1.76 x 1.80 cm   Ut wnl          Endometrium=16.05  mm   Neither ov seen     No adnexal masses seen   Impression:    The primary encounter diagnosis was PMB (postmenopausal bleeding). Diagnoses of Thickened endometrium and  Endometrial polyp were also pertinent to this visit.       Plan:  Recommend fx D+C , H/S , and myosure  resection of polyp  Benefits and risks to surgery: The proposed benefit of the surgery has been discussed with the patient. The possible risks include, but are not limited to: organ injury to the bowel , bladder, ureters, and major blood vessels and nerves. There is a possibility of additional surgeries resulting from these injuries. There is also the risk of blood transfusion and the need to receive blood products during or after the procedure which may rarely lead to HIV or Hepatitis C infection. There is a risk of developing a deep venous thrombosis or a pulmonary embolism . There is the possibility of wound infection and also anesthetic complications, even the rare possibility of death. The patient understands these risks and wishes to proceed. All questions have been answered  No orders of the defined types were placed in this encounter.      Vilma Prader, MD

## 2021-09-17 ENCOUNTER — Other Ambulatory Visit: Payer: Self-pay | Admitting: Obstetrics and Gynecology

## 2021-09-18 ENCOUNTER — Other Ambulatory Visit: Payer: Self-pay

## 2021-09-18 ENCOUNTER — Encounter
Admission: RE | Admit: 2021-09-18 | Discharge: 2021-09-18 | Disposition: A | Discharge status: Home / Self Care | Payer: Medicare Other | Source: Ambulatory Visit | Attending: Obstetrics and Gynecology | Admitting: Obstetrics and Gynecology

## 2021-09-18 DIAGNOSIS — Z01812 Encounter for preprocedural laboratory examination: Secondary | ICD-10-CM

## 2021-09-18 DIAGNOSIS — I10 Essential (primary) hypertension: Secondary | ICD-10-CM

## 2021-09-18 HISTORY — DX: Anemia, unspecified: D64.9

## 2021-09-18 HISTORY — DX: Atherosclerotic heart disease of native coronary artery without angina pectoris: I25.10

## 2021-09-18 HISTORY — DX: Prediabetes: R73.03

## 2021-09-18 NOTE — Patient Instructions (Addendum)
Your procedure is scheduled on: 09/25/21 - Thursday Report to the Registration Desk on the 1st floor of the [facility]. To find out your arrival time, please call [phone] between 1PM - 3PM on: 09/24/21 - Wednesday If your arrival time is 6:00 am, do not arrive prior to that time as the [facility] entrance doors do not open until 6:00 am.  REMEMBER: Instructions that are not followed completely may result in serious medical risk, up to and including death; or upon the discretion of your surgeon and anesthesiologist your surgery may need to be rescheduled.  Do not eat food after midnight the night before surgery.  No gum chewing, lozengers or hard candies.  You may however, drink CLEAR liquids up to 2 hours before you are scheduled to arrive for your surgery. Do not drink anything within 2 hours of your scheduled arrival time.  Clear liquids include: - water  - apple juice without pulp - gatorade (not RED colors) - black coffee or tea (Do NOT add milk or creamers to the coffee or tea) Do NOT drink anything that is not on this list.  Type 1 and Type 2 diabetics should only drink water.  In addition, your doctor has ordered for you to drink the provided  Ensure Pre-Surgery Clear Carbohydrate Drink  Drinking this carbohydrate drink up to two hours before surgery helps to reduce insulin resistance and improve patient outcomes. Please complete drinking 2 hours prior to scheduled arrival time.  TAKE ONLY THESE MEDICATIONS THE MORNING OF SURGERY WITH A SIP OF WATER:  - atorvastatin (LIPITOR)  - buPROPion (WELLBUTRIN SR) - carvedilol (COREG)  - FLUoxetine (PROZAC)  - fluticasone (FLONASE) - levothyroxine (SYNTHROID) - megestrol (MEGACE) - mometasone-formoterol (DULERA)  - Tiotropium Bromide Monohydrate (SPIRIVA RESPIMAT)   Use albuterol (PROVENTIL HFA;VENTOLIN HFA) on the day of surgery and bring to the hospital.  Hold OZEMPIC  7 days prior to your procedure.  Hold  dapagliflozin propanediol (FARXIGA) , do not take beginning 09/22/21, may resume taking the day after surgery.  Follow recommendations from Cardiologist, Pulmonologist or PCP regarding stopping Aspirin, Coumadin, Plavix, Eliquis, Pradaxa, or Pletal.  STOP taking One week prior to surgery: meloxicam (MOBIC) Stop Anti-inflammatories (NSAIDS) such as Advil, Aleve, Ibuprofen, Motrin, Naproxen, Naprosyn and Aspirin based products such as Excedrin, Goodys Powder, BC Powder.  Stop ANY OVER THE COUNTER supplements until after surgery.Multiple Vitamins-Minerals   You may take Tylenol if needed for pain up until the day of surgery.  No Alcohol for 24 hours before or after surgery.  No Smoking including e-cigarettes for 24 hours prior to surgery.  No chewable tobacco products for at least 6 hours prior to surgery.  No nicotine patches on the day of surgery.  Do not use any "recreational" drugs for at least a week prior to your surgery.  Please be advised that the combination of cocaine and anesthesia may have negative outcomes, up to and including death. If you test positive for cocaine, your surgery will be cancelled.  On the morning of surgery brush your teeth with toothpaste and water, you may rinse your mouth with mouthwash if you wish. Do not swallow any toothpaste or mouthwash.  Do not wear jewelry, make-up, hairpins, clips or nail polish.  Do not wear lotions, powders, or perfumes.   Do not shave body from the neck down 48 hours prior to surgery just in case you cut yourself which could leave a site for infection.  Also, freshly shaved skin may become  irritated if using the CHG soap.  Contact lenses, hearing aids and dentures may not be worn into surgery.  Do not bring valuables to the hospital. [facility] is not responsible for any missing/lost belongings or valuables.   Bring your C-PAP to the hospital with you in case you may have to spend the night.   Notify your doctor if there  is any change in your medical condition (cold, fever, infection).  Wear comfortable clothing (specific to your surgery type) to the hospital.  After surgery, you can help prevent lung complications by doing breathing exercises.  Take deep breaths and cough every 1-2 hours. Your doctor may order a device called an Incentive Spirometer to help you take deep breaths. When coughing or sneezing, hold a pillow firmly against your incision with both hands. This is called "splinting." Doing this helps protect your incision. It also decreases belly discomfort.  If you are being admitted to the hospital overnight, leave your suitcase in the car. After surgery it may be brought to your room.  If you are being discharged the day of surgery, you will not be allowed to drive home. You will need a responsible adult (18 years or older) to drive you home and stay with you that night.   If you are taking public transportation, you will need to have a responsible adult (18 years or older) with you. Please confirm with your physician that it is acceptable to use public transportation.   Please call the Pre-admissions Testing Dept. at [phone] if you have any questions about these instructions.  Surgery Visitation Policy:  Patients undergoing a surgery or procedure may have two family members or support persons with them as long as the person is not COVID-19 positive or experiencing its symptoms.   Inpatient Visitation:    Visiting hours are 7 a.m. to 8 p.m. Up to four visitors are allowed at one time in a patient room, including children. The visitors may rotate out with other people during the day. One designated support person (adult) may remain overnight.

## 2021-09-19 ENCOUNTER — Encounter: Payer: Self-pay | Admitting: Obstetrics and Gynecology

## 2021-09-19 NOTE — Progress Notes (Signed)
Perioperative Services  Pre-Admission/Anesthesia Testing Clinical Review  Date: 09/24/21  Patient Demographics:  Name: Kristie Dorsey DOB:   07-Mar-1960 MRN:   [id number]  Planned Surgical Procedure(s):    Case: [id number] Date/Time: 09/25/21 1054   Procedure: [place]   Anesthesia type: Choice   Pre-op diagnosis: postmenopausal bleeding, endometrial polyp   Location: [facility] OR ROOM 05 / [facility]   Surgeons: Schermerhorn, Gwen Her, MD   NOTE: Available PAT nursing documentation and vital signs have been reviewed. Clinical nursing staff has updated patient's PMH/PSHx, current medication list, and drug allergies/intolerances to ensure comprehensive history available to assist in medical decision making as it pertains to the aforementioned surgical procedure and anticipated anesthetic course. Extensive review of available clinical information performed. [place] PMH and PSHx updated with any diagnoses/procedures that  may have been inadvertently omitted during her intake with the pre-admission testing department's nursing staff.  Clinical Discussion:  Kristie Dorsey is a 61 y.o. female who is submitted for pre-surgical anesthesia review and clearance prior to her undergoing the above procedure. Patient is a Former Smoker (66 pack years; quit 11/2018). Pertinent PMH includes: cardiomyopathy, HFrEF, aortic valve stenosis (s/p TAVR), angina, LBBB, carotid stenosis, HTN, hypothyroidism, T2DM, COPD (on supplemental oxygen), DOE, OSAH (requires nocturnal BiPAP therapy), morbid obesity with associated hypoventilation syndrome, anemia, AUB secondary to endometrial polyp, OA, depression.  Patient is followed by cardiology Clayborn Bigness, MD). She was last seen in the cardiology clinic on 08/26/2021; notes reviewed.  At the time of her clinic visit, patient reported to be feeling "fairly well".  Patient denied any episodes of chest pain,  however had chronic dyspnea related to her underlying COPD diagnosis.  Patient remained on supplemental oxygen.  She denied any PND, orthopnea, palpitations, significant peripheral edema, vertiginous symptoms, or presyncope/syncope.  Patient with a past medical history significant for cardiovascular diagnoses.  Patient with a history worsening cardiomyopathy with resulting CHF.  TTE performed on 03/25/2018 revealed a moderately reduced left ventricular systolic function with an EF of 30-35%.  There was diffuse hypokinesis. Diastolic Doppler parameters consistent with abnormal relaxation (G1DD).  Severe aortic valve stenosis noted with a mean pressure gradient of 39 mmHg.    Diagnostic left heart catheterization was performed on 04/14/2018 revealing normal left ventricular systolic function with an EF of 60%.  Coronary anatomy was normal with no evidence of obstructive CAD.  There was trivial aortic valve regurgitation.  Aortic valve noted to be severely calcified with restricted motion.  Transvalvular gradient significantly increased at 40 mmHg.  Patient ultimately underwent TAVR procedure on 11/01/2018 placing a 23 mm Edwards SAPIEN bioprosthetic valve. TEE performed on 11/11/2018 revealed no paravalvular regurgitation and a well-seated prosthetic aortic valve.  Patient was discharged home on POD #4 following an uncomplicated surgical course with instructions to follow-up with outpatient cardiology.  Myocardial perfusion imaging study performed on 11/09/2019 revealing a moderately reduced left ventricular systolic function with an EF of 30%.  Blood pressure demonstrated normal response to exercise.  There was no evidence of stress-induced myocardial ischemia or arrhythmia; no scintigraphic evidence of scar.  Study determined to be intermediate risk.  Most recent TTE was performed on 08/20/2021 revealing a severely reduced left ventricular systolic function with an EF 25%.  There was global hypokinesis  with septal akinesis noted.  Left ventricle moderately enlarged.  There was moderate mitral annular calcification.  Left atrium moderately enlarged.  There was trivial tricuspid and mild mitral valve regurgitation.  Bioprosthetic aortic valve well-seated and functioning properly; mean transvalvular pressure gradient 17 mmHg.  Following TAVR, patient remains on daily DAPT therapy (ASA + clopidogrel).  Patient reportedly compliant with therapy with no evidence or reports of GI bleeding.  Blood pressure reasonably controlled at 132/84 on currently prescribed beta-blocker (carvedilol), diuretic (furosemide and spironolactone), and ARB/ANRI (Entresto) therapies.  Patient is on a statin for her HLD diagnosis and further ASCVD prevention.  T2DM well-controlled per patient report; last HgbA1c that I have access to review was 6.6% when checked on 07/05/2019.  Additionally, patient is on a GLP-1 (semaglutide).  Patient has an OSAH diagnosis and is reported to be compliant with prescribed nocturnal BiPAP therapy.  Patient also compliant with prescribed supplemental oxygen (2-3 L/Kristie Dorsey) as needed with exertion.  Functional capacity limited by BILATERAL lower extremity pain, cardiovascular/cardiopulmonary diagnoses, and multiple other medical comorbidities.  Per the DASI, patient not felt to be able to achieve 4 METS of activity without experiencing angina/anginal equivalent symptoms.  Once again discussed fluid restriction in the setting of known heart failure.  No changes were made to her medication regimen.  Given further decline in her EF on recent TTE, patient was started on an SGLT2i (empagliflozin) for treatment of her heart failure in the setting of concurrent T2DM.  Additionally, patient was referred to electrophysiology for consultation regarding possible CRT-D placement.  Spironolactone dose was increased during the visit.  No other changes were made to her medication regimen.  Patient to follow-up with outpatient  cardiology in 3 months or sooner if needed.  Kristie Dorsey is scheduled for an [place] on 09/25/2021 with Dr. Laverta Baltimore, MD.  Given patient's past medical history significant for cardiovascular diagnoses, presurgical cardiac clearance was sought by the PAT team. Per cardiology, "this patient is optimized for surgery and may proceed with the planned procedural course with a LOW risk of significant perioperative cardiovascular complications". As previously mentioned, this patient is on daily DAPT therapy.  She has been instructed on recommendations for holding her clopidogrel for 5 days prior to her procedure with plans to restart as soon as postoperative bleeding respectively minimized by her primary attending surgeon.  The patient is aware that her last dose of clinical should be on 09/19/2021.  The patient will continue her low-dose ASA dose throughout her perioperative course.  Patient denies previous perioperative complications with anesthesia in the past.  Patient experienced (+) delayed emergence from general anesthesia following her TAVR. in review of the available records, it is noted that patient underwent a general anesthetic course here at [facility] ([facility]) in 05/2020 without documented complications.      06/23/2021   10:00 AM 06/20/2021    9:08 AM 12/04/2020   11:06 AM  Vitals with BMI  Height _0  _1  _2   Weight 320 lbs 312 lbs 325 lbs  BMI 58.51 46.96 29.52  Systolic 841 324 401  Diastolic 74 71 72  Pulse 75 81 91    Providers/Specialists:   NOTE: Primary physician provider listed below. Patient may have been seen by APP or partner within same practice.   PROVIDER ROLE / SPECIALTY LAST Cannon Kettle, MD OB/GYN (Surgeon) 09/15/2021  Letta Median, MD Primary Care Provider ???  Katrine Coho, MD Cardiology 08/26/2021  Vernard Gambles, MD Pulmonary  Medicine 06/23/2021  Leotis Pain, MD Vascular Surgery 06/20/2021   Allergies:  Patient has no known allergies.  Current Home Medications:  No current facility-administered medications for this encounter.    albuterol (PROVENTIL HFA;VENTOLIN HFA) 108 (90 Base) MCG/ACT inhaler   aspirin EC 81 MG EC tablet   atorvastatin (LIPITOR) 10 MG tablet   buPROPion (WELLBUTRIN SR) 150 MG 12 hr tablet   carvedilol (COREG) 12.5 MG tablet   clopidogrel (PLAVIX) 75 MG tablet   dapagliflozin propanediol (FARXIGA) 10 MG TABS tablet   ferrous sulfate 325 (65 FE) MG tablet   FLUoxetine (PROZAC) 40 MG capsule   fluticasone (FLONASE) 50 MCG/ACT nasal spray   furosemide (LASIX) 40 MG tablet   levothyroxine (SYNTHROID, LEVOTHROID) 75 MCG tablet   megestrol (MEGACE) 40 MG tablet   meloxicam (MOBIC) 15 MG tablet   mometasone-formoterol (DULERA) 200-5 MCG/ACT AERO   Multiple Vitamins-Minerals (MULTIVITAMIN WITH MINERALS) tablet   OZEMPIC, 0.25 OR 0.5 MG/DOSE, 2 MG/1.5ML SOPN   sacubitril-valsartan (ENTRESTO) 49-51 MG   Spacer/Aero-Holding Chambers (AEROCHAMBER MV) inhaler   spironolactone (ALDACTONE) 25 MG tablet   Tiotropium Bromide Monohydrate (SPIRIVA RESPIMAT) 2.5 MCG/ACT AERS   traZODone (DESYREL) 100 MG tablet   History:   Past Medical History:  Diagnosis Date   Abnormal uterine bleeding due to endometrial polyp    Anemia    Anginal pain (HCC)    Aortic atherosclerosis (HCC)    Arthritis    Cardiomyopathy ([place])    a.) TTE 03/25/2018: EF 30-35%; b.) TTE 08/03/2019: EF 30%; c.) TTE 01/15/2020: EF 35%; d.) TTE 12/31/2020: EF 30%; e.) TTE 08/20/2021: EF 25%   Carotid stenosis    a.) s/p RIGHT CEA 06/20/2020   CHF (congestive heart failure) (HCC)    a.) TTE 03/25/18: EF 30-35%, diff HK, mild MR, sev AS (MPG 39), G1DD; b.) TTE 08/03/19: EF 30%, mild LVH, glob HK, mod LAE, mild MR, triv TR; c.) TTE 01/15/20: EF 35%, mild LVH, basal-apical/antsep/infsep HK, mild LAE, triv TR/PR, mod MR, G1DD; d.)  TTE 12/31/20: EF 30%, mod glob HK, mod LAE, triv TR/PR, mild MR, G1DD; e.) TTE 08/20/21: EF 25%, mod MVE/LAE, glob HK, septal AK, MAC, triv TR, mild MR   Complication of anesthesia    a.) delayed emergence following AVR   COPD (chronic obstructive pulmonary disease) (HCC)    Coronary artery disease    Depression    Dyspnea    Hypertension    Hypothyroidism    LBBB (left bundle branch block)    Morbid obesity (HCC)    MRSA nasal colonization 06/20/2020   Nonrheumatic aortic (valve) stenosis    a.) TTE 03/25/2018: EF 30-35, sev AS (MPG 39 mmHg); b.) 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; 23 mm Edwards SAPIEN 3   Obesity hypoventilation syndrome (HCC)    On supplemental oxygen therapy    a.) 2-3 L/[place] as needed   OSA treated with BiPAP    T2DM (type 2 diabetes mellitus) ([place])    Past Surgical History:  Procedure Laterality Date   CHOLECYSTECTOMY     ENDARTERECTOMY Right 06/20/2020   Procedure: ENDARTERECTOMY CAROTID;  Surgeon: Algernon Huxley, MD;  Location: [facility] ORS;  Service: Vascular;  Laterality: Right;   HYSTEROSCOPY WITH D & C N/A 02/28/2020   Procedure: DILATATION AND CURETTAGE;  Surgeon: Ward, Honor Loh, MD;  Location: [facility] ORS;  Service: Gynecology;  Laterality: N/A;   LEFT HEART CATH AND CORONARY ANGIOGRAPHY N/A 04/14/2018   Procedure: LEFT HEART CATH AND CORONARY ANGIOGRAPHY;  Surgeon: Yolonda Kida, MD;  Location: [facility];  Service: Cardiovascular;  Laterality: N/A;   TONSILLECTOMY     TRANSCATHETER AORTIC  VALVE REPLACEMENT, TRANSAORTIC Right 11/01/2018   Procedure: TRANSCATHETER AORTIC VALVE REPLACEMENT, TRANSAORTIC via a RIGHT FEMORAL APPROACH; Location: [facility]; Surgeon: Marisa Severin, MD   TUBAL LIGATION     Family History  Adopted: Yes  Problem Relation Age of Onset   Aneurysm Mother    Cancer Father    Social History   Tobacco Use   Smoking status: Former    Packs/day: 1.50    Years: 46.00    Total pack years: 69.00    Types: Cigarettes    Start date: 03/02/1973     Quit date: 11/01/2018    Years since quitting: 2.8   Smokeless tobacco: Never   Tobacco comments:    5 cigarettes daily--06/23/2021   Vaping Use   Vaping Use: Some days   Start date: 11/01/2018   Substances: Nicotine   Devices: trying to quit  Substance Use Topics   Alcohol use: No   Drug use: No    Pertinent Clinical Results:  LABS: Labs reviewed: Acceptable for surgery.  Hospital Outpatient Visit on 09/22/2021  Component Date Value Ref Range Status   ABO/RH(D) 09/22/2021 O POS   Final   Antibody Screen 09/22/2021 NEG   Final   Sample Expiration 09/22/2021 10/06/2021,2359   Final   Extend sample reason 09/22/2021    Final  Value:NO TRANSFUSIONS OR PREGNANCY IN THE PAST 3 MONTHS Performed at [facility], [address] [place], [place] [address]   WBC 09/22/2021 7.1  4.0 - 10.5 K/uL Final   RBC 09/22/2021 4.28  3.87 - 5.11 MIL/uL Final   Hemoglobin 09/22/2021 11.4 (L)  12.0 - 15.0 g/dL Final   HCT 09/22/2021 37.2  36.0 - 46.0 % Final   MCV 09/22/2021 86.9  80.0 - 100.0 fL Final   MCH 09/22/2021 26.6  26.0 - 34.0 pg Final   MCHC 09/22/2021 30.6  30.0 - 36.0 g/dL Final   RDW 09/22/2021 15.2  11.5 - 15.5 % Final   Platelets 09/22/2021 216  150 - 400 K/uL Final   nRBC 09/22/2021 0.0  0.0 - 0.2 % Final   Performed at [facility], [address] [place], [place] [address]    Ref Range & Units 08/26/2021  Glucose 70 - 110 mg/dL 96   Sodium 136 - 145 mmol/L 138   Potassium 3.6 - 5.1 mmol/L 4.0   Chloride 97 - 109 mmol/L 101   Carbon Dioxide (CO2) 22.0 - 32.0 mmol/L 29.8   Calcium 8.7 - 10.3 mg/dL 9.3   Urea Nitrogen (BUN) 7 - 25 mg/dL 9   Creatinine 0.6 - 1.1 mg/dL 0.9   Glomerular Filtration Rate (eGFR), MDRD Estimate >60 mL/min/1.73sq m 63   BUN/Crea Ratio 6.0 - 20.0 10.0   Anion Gap w/K 6.0 - 16.0 11.2   Specimen Collected: 08/26/21 12:27   Performed by: [place]: 08/26/21 15:57  Received From: [facility]  Result Received: 09/04/21 10:37    ECG: Date: 09/22/2021 Time ECG obtained: 1115 AM Rate: 82 bpm Rhythm:  Normal sinus rhythm; LBBB Axis (leads I and aVF): Normal Intervals: PR 162 ms. QRS 176 ms. QTc 509 ms. ST segment and T wave changes: No evidence of acute ST segment elevation or depression Comparison: Similar to previous tracing obtained on 06/18/2020   IMAGING / PROCEDURES: TRANSTHORACIC ECHOCARDIOGRAM performed on 08/20/2021 Severely reduced left ventricular systolic function with an EF of 25% Global hypokinesis with septal akinesis Moderate left ventricular and left atrial enlargement  Mitral annular calcification Trivial TR Mild MR No AR or PR Well-seated bioprosthetic aortic valve with a transvalvular gradient of 17.0 mmHg  VASCULAR US CAROTID performed on 06/20/2021 Right Carotid: The extracranial vessels were near-normal with only minimal wall thickening or plaque. Patent endarterectomy site with no hemodynamically significant stenosis. Patent endarterectomy site with no hemodynamically significant stenosis Left Carotid: Velocities in the left ICA are consistent with a 1-39% stenosis. The extracranial vessels were near-normal with only minimal wall thickening or plaque.   MYOCARDIAL PERFUSION IMAGING STUDY (LEXISCAN) performed on 11/09/2019 Moderately reduced left ventricular systolic function with an EF of 30-44% No evidence of stress-induced myocardial ischemia or arrhythmia Blood pressure demonstrated normal response to exercise Intermediate risk scan  LEFT HEART CATHETERIZATION AND CORONARY ANGIOGRAPHY performed on 04/14/2018 Normal left ventricular systolic function with an EF of 60% Severe aortic valve stenosis with trivial regurgitation.  Transvalvular gradient 40 mmHg. Normal coronary anatomy with no evidence of obstructive CAD  Impression and Plan:  Kristie Dorsey has been referred for pre-anesthesia review and clearance prior to her undergoing the  planned anesthetic and procedural courses. Available labs, pertinent testing, and imaging results were personally reviewed by me. This patient has been appropriately cleared by cardiology with an overall LOW risk of significant perioperative cardiovascular complications.  Based on clinical review performed today (09/24/21), barring any significant acute changes in the patient's overall condition, it is anticipated that she will be able to proceed with the planned surgical intervention. Any acute changes in clinical condition may necessitate her procedure being postponed and/or cancelled. Patient will meet with anesthesia team (MD and/or CRNA) on the day of her procedure for preoperative evaluation/assessment. Questions regarding anesthetic course will be fielded at that time.   Pre-surgical instructions were reviewed with the patient during her PAT appointment and questions were fielded by PAT clinical staff. Patient was advised that if any questions or concerns arise prior to her procedure then she should return a call to PAT and/or her surgeon's office to discuss.  Honor Loh, MSN, APRN, FNP-C, CEN [facility]  Peri-operative Services Nurse Practitioner Phone: [phone] Fax: [phone] 09/24/21 10:57 AM  NOTE: This note has been prepared using Dragon dictation software. Despite my best ability to proofread, there is always the potential that unintentional transcriptional errors may still occur from this process.

## 2021-09-22 ENCOUNTER — Encounter
Admission: RE | Admit: 2021-09-22 | Discharge: 2021-09-22 | Disposition: A | Discharge status: Home / Self Care | Payer: Medicare Other | Source: Ambulatory Visit | Attending: Obstetrics and Gynecology | Admitting: Obstetrics and Gynecology

## 2021-09-22 DIAGNOSIS — I10 Essential (primary) hypertension: Secondary | ICD-10-CM | POA: Insufficient documentation

## 2021-09-22 DIAGNOSIS — Z01818 Encounter for other preprocedural examination: Secondary | ICD-10-CM | POA: Insufficient documentation

## 2021-09-22 DIAGNOSIS — Z01812 Encounter for preprocedural laboratory examination: Secondary | ICD-10-CM

## 2021-09-22 LAB — TYPE AND SCREEN
ABO/RH(D): O POS
Antibody Screen: NEGATIVE

## 2021-09-22 LAB — CBC
HCT: 37.2 % (ref 36.0–46.0)
Hemoglobin: 11.4 g/dL — ABNORMAL LOW (ref 12.0–15.0)
MCH: 26.6 pg (ref 26.0–34.0)
MCHC: 30.6 g/dL (ref 30.0–36.0)
MCV: 86.9 fL (ref 80.0–100.0)
Platelets: 216 10*3/uL (ref 150–400)
RBC: 4.28 MIL/uL (ref 3.87–5.11)
RDW: 15.2 % (ref 11.5–15.5)
WBC: 7.1 10*3/uL (ref 4.0–10.5)
nRBC: 0 % (ref 0.0–0.2)

## 2021-09-23 NOTE — Progress Notes (Signed)
  Perioperative Services Pre-Admission/Anesthesia Testing    Date: 09/23/21  Name: Kristie Dorsey MRN:   [id number]  Re: GLP-1 clearance and provider recommendations   Planned Surgical Procedure(s):    Case: [id number] Date/Time: 09/25/21 1054   Procedure: FRACTIONAL DILATATION & CURETTAGE/HYSTEROSCOPY WITH MYOSURE   Anesthesia type: Choice   Pre-op diagnosis: postmenopausal bleeding, endometrial polyp   Location: [facility] OR ROOM 05 / [facility]   Surgeons: Schermerhorn, Ihor Austin, MD   Clinical Notes:  Patient is scheduled for the above procedure on 09/25/2021 with Dr. Jennell Corner, MD. In review of her medication reconciliation it was noted that patient is on a prescribed GLP-1 medication (semaglutide). Per guidelines issued by the American Society of Anesthesiologists (ASA), it is recommended that these medications be held for 7 days prior to the patient undergoing any type of elective surgical procedure.   Reached out to prescribing provider Hessie Diener, MD) to make them aware of the guidelines from anesthesia. Given that this patient takes the prescribe GLP-1 medication for her  diabetes diagnosis rather than for weight loss, recommendations from the prescribing provider were solicited. Prescribing provider made aware of the following so that informed decision/POC can be developed for this patient that may be taking medications belonging to these drug classes:  Oral GLP-1 medications will be held 1 day prior to surgery.  Injectable GLP-1 medications will be held 7 days prior to surgery.  Metformin is routinely held 48 hours prior to surgery due to renal concerns, potential need for contrasted imaging perioperatively, and the potential for tissue hypoxia leading to drug induced lactic acidosis.  All SGLT2i medications are held 72 hours prior to surgery as they can be associated with the increased potential for developing euglycemic diabetic ketoacidosis (EDKA).    Impression and Plan:  Kristie Dorsey is on a prescribed GLP-1 medication, which induces the known side effect of decreased gastric emptying. Efforts are bring made to mitigate the risk of perioperative hyperglycemic events, as elevated blood glucose levels have been found to contribute to intra/postoperative complications. Additionally, hyperglycemic extremes can potentially necessitate the postponing of a patient's elective case in order to better optimize perioperative glycemic control, again with the aforementioned guidelines in place. With this in mind, recommendations have been sought from the prescribing provider, who has cleared patient to proceed with holding the prescribed GLP-1 as per the guidelines from the ASA.   Provider recommending: no further recommendations received from prescribing provider.  Copy of signed clearance and recommendations placed on patient's chart for inclusion in their medical record and for review by the surgical/anesthetic team on the day of her procedure.   Quentin Mulling, MSN, APRN, FNP-C, CEN [facility]  Peri-operative Services Nurse Practitioner Phone: [phone] 09/23/21 9:38 AM  NOTE: This note has been prepared using Dragon dictation software. Despite my best ability to proofread, there is always the potential that unintentional transcriptional errors may still occur from this process.

## 2021-09-24 ENCOUNTER — Encounter: Payer: Self-pay | Admitting: Obstetrics and Gynecology

## 2021-09-25 ENCOUNTER — Ambulatory Visit: Payer: Medicare Other | Admitting: Urgent Care

## 2021-09-25 ENCOUNTER — Other Ambulatory Visit: Payer: Self-pay

## 2021-09-25 ENCOUNTER — Encounter: Payer: Self-pay | Admitting: Obstetrics and Gynecology

## 2021-09-25 ENCOUNTER — Encounter
Admission: RE | Disposition: A | Discharge status: Home / Self Care | Payer: Self-pay | Source: Home / Self Care | Attending: Obstetrics and Gynecology

## 2021-09-25 ENCOUNTER — Ambulatory Visit
Admission: RE | Admit: 2021-09-25 | Discharge: 2021-09-25 | Disposition: A | Discharge status: Home / Self Care | Payer: Medicare Other | Attending: Obstetrics and Gynecology | Admitting: Obstetrics and Gynecology

## 2021-09-25 DIAGNOSIS — I5020 Unspecified systolic (congestive) heart failure: Secondary | ICD-10-CM | POA: Diagnosis not present

## 2021-09-25 DIAGNOSIS — N840 Polyp of corpus uteri: Secondary | ICD-10-CM | POA: Diagnosis not present

## 2021-09-25 DIAGNOSIS — J449 Chronic obstructive pulmonary disease, unspecified: Secondary | ICD-10-CM | POA: Diagnosis not present

## 2021-09-25 DIAGNOSIS — E039 Hypothyroidism, unspecified: Secondary | ICD-10-CM | POA: Diagnosis not present

## 2021-09-25 DIAGNOSIS — E662 Morbid (severe) obesity with alveolar hypoventilation: Secondary | ICD-10-CM | POA: Diagnosis not present

## 2021-09-25 DIAGNOSIS — I25119 Atherosclerotic heart disease of native coronary artery with unspecified angina pectoris: Secondary | ICD-10-CM | POA: Insufficient documentation

## 2021-09-25 DIAGNOSIS — Z87891 Personal history of nicotine dependence: Secondary | ICD-10-CM | POA: Insufficient documentation

## 2021-09-25 DIAGNOSIS — Z79899 Other long term (current) drug therapy: Secondary | ICD-10-CM | POA: Insufficient documentation

## 2021-09-25 DIAGNOSIS — N950 Postmenopausal bleeding: Secondary | ICD-10-CM | POA: Insufficient documentation

## 2021-09-25 DIAGNOSIS — I110 Hypertensive heart disease with heart failure: Secondary | ICD-10-CM | POA: Insufficient documentation

## 2021-09-25 DIAGNOSIS — E119 Type 2 diabetes mellitus without complications: Secondary | ICD-10-CM | POA: Insufficient documentation

## 2021-09-25 DIAGNOSIS — Z01818 Encounter for other preprocedural examination: Secondary | ICD-10-CM

## 2021-09-25 DIAGNOSIS — Z6841 Body Mass Index (BMI) 40.0 and over, adult: Secondary | ICD-10-CM | POA: Diagnosis not present

## 2021-09-25 HISTORY — PX: DILATATION & CURETTAGE/HYSTEROSCOPY WITH MYOSURE: SHX6511

## 2021-09-25 HISTORY — DX: Polyp of corpus uteri: N84.0

## 2021-09-25 HISTORY — DX: Type 2 diabetes mellitus without complications: E11.9

## 2021-09-25 HISTORY — DX: Abnormal uterine and vaginal bleeding, unspecified: N93.9

## 2021-09-25 HISTORY — DX: Dependence on supplemental oxygen: Z99.81

## 2021-09-25 LAB — GLUCOSE, CAPILLARY: Glucose-Capillary: 84 mg/dL (ref 70–99)

## 2021-09-25 SURGERY — DILATATION & CURETTAGE/HYSTEROSCOPY WITH MYOSURE
Anesthesia: General

## 2021-09-25 MED ORDER — CHLORHEXIDINE GLUCONATE 0.12 % MT SOLN: 15.0000 mL | Freq: Once | OROMUCOSAL | Status: AC

## 2021-09-25 MED ORDER — SODIUM CHLORIDE 0.9 % IR SOLN
Status: DC | PRN
  Administered 2021-09-25: 479 mL

## 2021-09-25 MED ORDER — 0.9 % SODIUM CHLORIDE (POUR BTL) OPTIME
TOPICAL | Status: DC | PRN
  Administered 2021-09-25: 500 mL

## 2021-09-25 MED ORDER — LACTATED RINGERS IV SOLN: INTRAVENOUS | Status: DC

## 2021-09-25 MED ORDER — KETOROLAC TROMETHAMINE 30 MG/ML IJ SOLN
INTRAMUSCULAR | Status: DC | PRN
  Administered 2021-09-25: 15 mg via INTRAVENOUS

## 2021-09-25 MED ORDER — ACETAMINOPHEN 500 MG PO TABS: 1000.0000 mg | ORAL_TABLET | ORAL | Status: AC

## 2021-09-25 MED ORDER — ESMOLOL HCL 100 MG/10ML IV SOLN
INTRAVENOUS | Status: AC
  Filled 2021-09-25: qty 10

## 2021-09-25 MED ORDER — FENTANYL CITRATE (PF) 100 MCG/2ML IJ SOLN
INTRAMUSCULAR | Status: DC | PRN
  Administered 2021-09-25 (×2): 50 ug via INTRAVENOUS

## 2021-09-25 MED ORDER — FENTANYL CITRATE (PF) 100 MCG/2ML IJ SOLN
INTRAMUSCULAR | Status: AC
  Administered 2021-09-25: 25 ug via INTRAVENOUS
  Filled 2021-09-25: qty 2

## 2021-09-25 MED ORDER — OXYCODONE HCL 5 MG PO TABS
ORAL_TABLET | ORAL | Status: AC
  Filled 2021-09-25: qty 1

## 2021-09-25 MED ORDER — ESMOLOL HCL 100 MG/10ML IV SOLN
INTRAVENOUS | Status: DC | PRN
  Administered 2021-09-25: 20 mg via INTRAVENOUS

## 2021-09-25 MED ORDER — DEXAMETHASONE SODIUM PHOSPHATE 10 MG/ML IJ SOLN
INTRAMUSCULAR | Status: DC | PRN
  Administered 2021-09-25: 10 mg via INTRAVENOUS

## 2021-09-25 MED ORDER — PROPOFOL 10 MG/ML IV BOLUS
INTRAVENOUS | Status: DC | PRN
  Administered 2021-09-25: 160 mg via INTRAVENOUS

## 2021-09-25 MED ORDER — FAMOTIDINE 20 MG PO TABS
ORAL_TABLET | ORAL | Status: AC
  Administered 2021-09-25: 20 mg via ORAL
  Filled 2021-09-25: qty 1

## 2021-09-25 MED ORDER — CEFAZOLIN SODIUM-DEXTROSE 2-4 GM/100ML-% IV SOLN
2.0000 g | Freq: Once | INTRAVENOUS | Status: AC
  Administered 2021-09-25: 2 g via INTRAVENOUS

## 2021-09-25 MED ORDER — GABAPENTIN 300 MG PO CAPS
300.0000 mg | ORAL_CAPSULE | ORAL | Status: AC
Start: 2021-09-25 — End: 2021-09-25

## 2021-09-25 MED ORDER — PHENYLEPHRINE HCL (PRESSORS) 10 MG/ML IV SOLN
INTRAVENOUS | Status: DC | PRN
  Administered 2021-09-25: 80 ug via INTRAVENOUS

## 2021-09-25 MED ORDER — FENTANYL CITRATE (PF) 100 MCG/2ML IJ SOLN
25.0000 ug | INTRAMUSCULAR | Status: DC | PRN
  Administered 2021-09-25: 25 ug via INTRAVENOUS

## 2021-09-25 MED ORDER — OXYCODONE HCL 5 MG PO TABS
5.0000 mg | ORAL_TABLET | Freq: Once | ORAL | Status: AC
  Administered 2021-09-25: 5 mg via ORAL

## 2021-09-25 MED ORDER — FENTANYL CITRATE (PF) 100 MCG/2ML IJ SOLN
INTRAMUSCULAR | Status: AC
  Filled 2021-09-25: qty 2

## 2021-09-25 MED ORDER — ONDANSETRON HCL 4 MG/2ML IJ SOLN
INTRAMUSCULAR | Status: DC | PRN
  Administered 2021-09-25: 4 mg via INTRAVENOUS

## 2021-09-25 MED ORDER — FAMOTIDINE 20 MG PO TABS: 20.0000 mg | ORAL_TABLET | Freq: Once | ORAL | Status: AC

## 2021-09-25 MED ORDER — PHENYLEPHRINE 80 MCG/ML (10ML) SYRINGE FOR IV PUSH (FOR BLOOD PRESSURE SUPPORT)
PREFILLED_SYRINGE | INTRAVENOUS | Status: AC
  Filled 2021-09-25: qty 10

## 2021-09-25 MED ORDER — ORAL CARE MOUTH RINSE: 15.0000 mL | Freq: Once | OROMUCOSAL | Status: AC

## 2021-09-25 MED ORDER — POVIDONE-IODINE 10 % EX SWAB
2.0000 | Freq: Once | CUTANEOUS | Status: AC
  Administered 2021-09-25: 2 via TOPICAL

## 2021-09-25 MED ORDER — ACETAMINOPHEN 500 MG PO TABS
ORAL_TABLET | ORAL | Status: AC
  Administered 2021-09-25: 1000 mg via ORAL
  Filled 2021-09-25: qty 2

## 2021-09-25 MED ORDER — CEFAZOLIN SODIUM-DEXTROSE 2-4 GM/100ML-% IV SOLN
INTRAVENOUS | Status: AC
  Filled 2021-09-25: qty 100

## 2021-09-25 MED ORDER — GABAPENTIN 300 MG PO CAPS
ORAL_CAPSULE | ORAL | Status: AC
  Administered 2021-09-25: 300 mg via ORAL
  Filled 2021-09-25: qty 1

## 2021-09-25 MED ORDER — ONDANSETRON HCL 4 MG/2ML IJ SOLN: 4.0000 mg | Freq: Once | INTRAMUSCULAR | Status: DC | PRN

## 2021-09-25 MED ORDER — SUCCINYLCHOLINE CHLORIDE 200 MG/10ML IV SOSY
PREFILLED_SYRINGE | INTRAVENOUS | Status: DC | PRN
  Administered 2021-09-25: 120 mg via INTRAVENOUS

## 2021-09-25 MED ORDER — CHLORHEXIDINE GLUCONATE 0.12 % MT SOLN
OROMUCOSAL | Status: AC
  Administered 2021-09-25: 15 mL via OROMUCOSAL
  Filled 2021-09-25: qty 15

## 2021-09-25 MED ORDER — LIDOCAINE HCL (CARDIAC) PF 100 MG/5ML IV SOSY
PREFILLED_SYRINGE | INTRAVENOUS | Status: DC | PRN
  Administered 2021-09-25: 100 mg via INTRAVENOUS

## 2021-09-25 SURGICAL SUPPLY — 19 items
DRSG TELFA 3X8 NADH (GAUZE/BANDAGES/DRESSINGS) ×2 IMPLANT
GLOVE SURG SYN 8.0 (GLOVE) ×2 IMPLANT
GLOVE SURG SYN 8.0 PF PI (GLOVE) ×1 IMPLANT
GOWN STRL REUS W/ TWL LRG LVL3 (GOWN DISPOSABLE) ×1 IMPLANT
GOWN STRL REUS W/ TWL XL LVL3 (GOWN DISPOSABLE) ×1 IMPLANT
GOWN STRL REUS W/TWL LRG LVL3 (GOWN DISPOSABLE) ×2
GOWN STRL REUS W/TWL XL LVL3 (GOWN DISPOSABLE) ×2
IV NS IRRIG 3000ML ARTHROMATIC (IV SOLUTION) ×2 IMPLANT
KIT PROCEDURE FLUENT (KITS) ×1 IMPLANT
KIT TURNOVER CYSTO (KITS) ×2 IMPLANT
MANIFOLD NEPTUNE II (INSTRUMENTS) ×2 IMPLANT
PACK DNC HYST (MISCELLANEOUS) ×1 IMPLANT
PAD DRESSING TELFA 3X8 NADH (GAUZE/BANDAGES/DRESSINGS) ×1 IMPLANT
PAD OB MATERNITY 4.3X12.25 (PERSONAL CARE ITEMS) ×2 IMPLANT
PAD PREP 24X41 OB/GYN DISP (PERSONAL CARE ITEMS) ×2 IMPLANT
SCRUB CHG 4% DYNA-HEX 4OZ (MISCELLANEOUS) ×2 IMPLANT
SEAL ROD LENS SCOPE MYOSURE (ABLATOR) ×2 IMPLANT
TOWEL OR 17X26 4PK STRL BLUE (TOWEL DISPOSABLE) ×2 IMPLANT
WATER STERILE IRR 500ML POUR (IV SOLUTION) ×2 IMPLANT

## 2021-09-25 NOTE — Discharge Instructions (Signed)

## 2021-09-25 NOTE — Brief Op Note (Signed)
09/25/2021  11:54 AM  PATIENT:  Kristie Dorsey  61 y.o. female  PRE-OPERATIVE DIAGNOSIS:  postmenopausal bleeding,  POST-OPERATIVE DIAGNOSIS:  postmenopausal bleeding,   PROCEDURE:  Procedure(s): FRACTIONAL DILATATION & CURETTAGE/HYSTEROSCOPY (N/A)  SURGEON:  Surgeon(s) and Role:    * Natalyah Cummiskey, Ihor Austin, MD - Primary  PHYSICIAN ASSISTANT:   ASSISTANTS: CST   ANESTHESIA:   general  EBL:  5 mL IOF 100  uo 147cc  BLOOD ADMINISTERED:none  DRAINS: none   LOCAL MEDICATIONS USED:  NONE  SPECIMEN:  Source of Specimen:  ecc and endometrial curetting   DISPOSITION OF SPECIMEN:  PATHOLOGY  COUNTS:  YES  TOURNIQUET:  * No tourniquets in log *  DICTATION: .Other Dictation: Dictation Number verbal   PLAN OF CARE: Discharge to home after PACU  PATIENT DISPOSITION:  PACU - hemodynamically stable.   Delay start of Pharmacological VTE agent (>24hrs) due to surgical blood loss or risk of bleeding: not applicable

## 2021-09-25 NOTE — Anesthesia Procedure Notes (Signed)
Procedure Name: Intubation Date/Time: 09/25/2021 11:27 AM  Performed by: Ginger Carne, CRNAPre-anesthesia Checklist: Patient identified, Patient being monitored, Timeout performed, Emergency Drugs available and Suction available Patient Re-evaluated:Patient Re-evaluated prior to induction Oxygen Delivery Method: Circle system utilized Preoxygenation: Pre-oxygenation with 100% oxygen Induction Type: IV induction and Rapid sequence Laryngoscope Size: 3 and McGraph Grade View: Grade I Tube type: Oral Tube size: 7.0 mm Number of attempts: 1 Airway Equipment and Method: Stylet and Video-laryngoscopy Placement Confirmation: ETT inserted through vocal cords under direct vision, positive ETCO2 and breath sounds checked- equal and bilateral Secured at: 21 cm Tube secured with: Tape Dental Injury: Teeth and Oropharynx as per pre-operative assessment

## 2021-09-25 NOTE — Progress Notes (Signed)
Pt here Fx D+C , H/S and myosure resection of endometrial polyp  Labs reviewed . NPO and all question answered .  Proceed

## 2021-09-25 NOTE — Transfer of Care (Signed)
Immediate Anesthesia Transfer of Care Note  Patient: Kristie Dorsey  Procedure(s) Performed: FRACTIONAL DILATATION & CURETTAGE/HYSTEROSCOPY  Patient Location: PACU  Anesthesia Type:General  Level of Consciousness: awake and drowsy  Airway & Oxygen Therapy: Patient Spontanous Breathing and Patient connected to face mask oxygen  Post-op Assessment: Report given to RN and Post -op Vital signs reviewed and stable  Post vital signs: Reviewed and stable  Last Vitals:  Vitals Value Taken Time  BP 153/76 09/25/21 1205  Temp    Pulse 88 09/25/21 1205  Resp 21 09/25/21 1205  SpO2 97 % 09/25/21 1205    Last Pain:  Vitals:   09/25/21 0954  TempSrc: Oral  PainSc: 0-No pain         Complications: No notable events documented.

## 2021-09-25 NOTE — Anesthesia Preprocedure Evaluation (Signed)
Anesthesia Evaluation  Patient identified by MRN, date of birth, ID band Patient awake    Reviewed: Allergy & Precautions, NPO status , Patient's Chart, lab work & pertinent test results  Airway Mallampati: III  TM Distance: >3 FB Neck ROM: full    Dental  (+) Edentulous Upper, Edentulous Lower   Pulmonary neg pulmonary ROS, shortness of breath and with exertion, sleep apnea and Continuous Positive Airway Pressure Ventilation , COPD,  COPD inhaler, Patient abstained from smoking., former smoker,    Pulmonary exam normal  + decreased breath sounds      Cardiovascular Exercise Tolerance: Poor hypertension, Pt. on medications + angina with exertion + CAD and +CHF  negative cardio ROS Normal cardiovascular exam+ dysrhythmias  Rhythm:Regular     Neuro/Psych Depression negative neurological ROS  negative psych ROS   GI/Hepatic negative GI ROS, Neg liver ROS,   Endo/Other  negative endocrine ROSdiabetes, Type 2Hypothyroidism Morbid obesity  Renal/GU   negative genitourinary   Musculoskeletal   Abdominal (+) + obese,   Peds negative pediatric ROS (+)  Hematology negative hematology ROS (+) Blood dyscrasia, anemia ,   Anesthesia Other Findings Past Medical History: No date: Abnormal uterine bleeding due to endometrial polyp No date: Anemia No date: Anginal pain ([place]) No date: Aortic atherosclerosis (HCC) No date: Arthritis No date: Cardiomyopathy [facility])     Comment:  a.) TTE 03/25/2018: EF 30-35%; b.) TTE 08/03/2019: EF               30%; c.) TTE 01/15/2020: EF 35%; d.) TTE 12/31/2020: EF               30%; e.) TTE 08/20/2021: EF 25% No date: Carotid stenosis     Comment:  a.) s/p RIGHT CEA 06/20/2020 No date: CHF (congestive heart failure) ([place])     Comment:  a.) TTE 03/25/18: EF 30-35%, diff HK, mild MR, sev AS               (MPG 39), G1DD; b.) TTE 08/03/19: EF 30%, mild LVH, glob               HK, mod LAE, mild MR,  triv TR; c.) TTE 01/15/20: EF 35%,               mild LVH, basal-apical/antsep/infsep HK, mild LAE, triv               TR/PR, mod MR, G1DD; d.) TTE 12/31/20: EF 30%, mod glob               HK, mod LAE, triv TR/PR, mild MR, G1DD; e.) TTE 08/20/21:              EF 25%, mod MVE/LAE, glob HK, septal AK, MAC, triv TR,               mild MR No date: Complication of anesthesia     Comment:  a.) delayed emergence following AVR No date: COPD (chronic obstructive pulmonary disease) (HCC) No date: Coronary artery disease No date: Depression No date: Dyspnea No date: Hypertension No date: Hypothyroidism No date: LBBB (left bundle branch block) No date: Morbid obesity ([place]) 06/20/2020: MRSA nasal colonization No date: Nonrheumatic aortic (valve) stenosis     Comment:  a.) TTE 03/25/2018: EF 30-35, sev AS (MPG 39 mmHg); b.)               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; 23 mm Edwards SAPIEN 3 No date: Obesity hypoventilation syndrome ([place]) No date:  On supplemental oxygen therapy     Comment:  a.) 2-3 L/[place] as needed No date: OSA treated with BiPAP No date: T2DM (type 2 diabetes mellitus) ([place])  Past Surgical History: No date: CHOLECYSTECTOMY 06/20/2020: ENDARTERECTOMY; Right     Comment:  Procedure: ENDARTERECTOMY CAROTID;  Surgeon: Algernon Huxley, MD;  Location: [facility] ORS;  Service: Vascular;                Laterality: Right; 02/28/2020: HYSTEROSCOPY WITH D & C; N/A     Comment:  Procedure: DILATATION AND CURETTAGE;  Surgeon: Ward,               Honor Loh, MD;  Location: [facility] ORS;  Service: Gynecology;              Laterality: N/A; 04/14/2018: LEFT HEART CATH AND CORONARY ANGIOGRAPHY; N/A     Comment:  Procedure: LEFT HEART CATH AND CORONARY ANGIOGRAPHY;                Surgeon: Yolonda Kida, MD;  Location: [place]              CV LAB;  Service: Cardiovascular;  Laterality: N/A; No date: TONSILLECTOMY 11/01/2018: TRANSCATHETER AORTIC VALVE REPLACEMENT, TRANSAORTIC; Right      Comment:  Procedure: TRANSCATHETER AORTIC VALVE REPLACEMENT,               TRANSAORTIC via a RIGHT FEMORAL APPROACH; Location: [facility];               Surgeon: Marisa Severin, MD No date: TUBAL LIGATION  BMI    Body Mass Index: 57.61 kg/m      Reproductive/Obstetrics negative OB ROS                             Anesthesia Physical Anesthesia Plan  ASA: 4  Anesthesia Plan: General   Post-op Pain Management:    Induction: Intravenous  PONV Risk Score and Plan: Ondansetron, Dexamethasone, Midazolam and Treatment may vary due to age or medical condition  Airway Management Planned: Oral ETT  Additional Equipment:   Intra-op Plan:   Post-operative Plan: Extubation in OR  Informed Consent: I have reviewed the patients History and Physical, chart, labs and discussed the procedure including the risks, benefits and alternatives for the proposed anesthesia with the patient or authorized representative who has indicated his/her understanding and acceptance.     Dental Advisory Given  Plan Discussed with: CRNA and Surgeon  Anesthesia Plan Comments:         Anesthesia Quick Evaluation

## 2021-09-25 NOTE — Op Note (Signed)
NAME: Kristie Dorsey, Kristie Dorsey MEDICAL RECORD NO: [id number] ACCOUNT NO: [id number] DATE OF BIRTH: 01-18-1961 FACILITY: [facility] LOCATION: [facility] PHYSICIAN: Suzy Bouchard, MD  Operative Report   DATE OF PROCEDURE: 09/25/2021  PREOPERATIVE DIAGNOSES: 1.  Postmenopausal bleeding. 2.  Endometrial polyp.  POSTOPERATIVE DIAGNOSES: 1.  Postmenopausal bleeding. 2.  No evidence of an endometrial polyp.  PROCEDURES: 1.  Fractional dilation and curettage. 2.  Hysteroscopy.  SURGEON:  Suzy Bouchard, MD  ANESTHESIA:  General endotracheal anesthesia.  INDICATIONS:  This is a 61 year old female with postmenopausal bleeding.  The patient has a prior history of postmenopausal bleeding and underwent a dilation and curettage and MyoSure resection of polyps in 2021 and was placed on Megace, which stabilized  the bleeding until recently when she started bleeding again.  DESCRIPTION OF PROCEDURE:  After adequate general endotracheal anesthesia, the patient was placed in dorsal supine position with the legs in the [place] stirrups.  The patient's lower abdomen, perineum and vagina were prepped and draped in normal sterile  fashion.  The patient did receive 2 grams of IV Ancef for surgical prophylaxis.  Timeout was performed.  Straight catheterization of the bladder yielded 140 mL dark urine.  A speculum was then placed into the vagina and the anterior cervix was grasped  with a single tooth tenaculum.  An endocervical curettage was performed followed by dilation of the cervix to #16 Hanks dilator.  The hysteroscope was then advanced into the endometrial cavity, which revealed a fluffy endometrium; however, no evidence of  definitive polyps.  Hysteroscope was removed and the cervix was then dilated to #20 Hanks dilator and endometrial curettage was then performed with good curetting of endometrial tissue.  Good hemostasis was noted and single tooth tenaculum was removed  from the cervix and again,  good hemostasis noted.  There were no complications.  The patient tolerated the procedure well.  INTRAOPERATIVE FLUIDS:  100 mL.  ESTIMATED BLOOD LOSS:  5 mL  URINE OUTPUT:  140 mL.  CONDITION:  The patient was taken to recovery room in good condition.   MUK D: 09/25/2021 12:10:49 pm T: 09/25/2021 10:08:00 pm  JOB: [id number]/ [id number]

## 2021-09-26 ENCOUNTER — Encounter: Payer: Self-pay | Admitting: Obstetrics and Gynecology

## 2021-09-26 LAB — SURGICAL PATHOLOGY

## 2021-09-26 NOTE — Anesthesia Postprocedure Evaluation (Signed)
Anesthesia Post Note  Patient: Kristie Dorsey  Procedure(s) Performed: FRACTIONAL DILATATION & CURETTAGE/HYSTEROSCOPY  Patient location during evaluation: PACU Anesthesia Type: General Level of consciousness: awake and sedated Pain management: satisfactory to patient Vital Signs Assessment: post-procedure vital signs reviewed and stable Respiratory status: spontaneous breathing and respiratory function stable Cardiovascular status: stable Anesthetic complications: no   No notable events documented.   Last Vitals:  Vitals:   09/25/21 1245 09/25/21 1258  BP: (!) 152/51 (!) 166/51  Pulse: 83 91  Resp: 20 18  Temp: (!) 36.4 C (!) 36.2 C  SpO2: 95% 95%    Last Pain:  Vitals:   09/25/21 1258  TempSrc: Temporal  PainSc: 6                  VAN STAVEREN,Bethann Qualley

## 2021-12-09 ENCOUNTER — Other Ambulatory Visit (INDEPENDENT_AMBULATORY_CARE_PROVIDER_SITE_OTHER): Payer: Self-pay | Admitting: Nurse Practitioner

## 2021-12-11 ENCOUNTER — Other Ambulatory Visit (INDEPENDENT_AMBULATORY_CARE_PROVIDER_SITE_OTHER): Payer: Self-pay | Admitting: Nurse Practitioner

## 2021-12-29 ENCOUNTER — Encounter (INDEPENDENT_AMBULATORY_CARE_PROVIDER_SITE_OTHER): Payer: Self-pay

## 2021-12-30 ENCOUNTER — Ambulatory Visit: Payer: Medicare Other | Admitting: Primary Care

## 2022-01-01 ENCOUNTER — Ambulatory Visit: Payer: Medicare Other | Admitting: Adult Health

## 2022-01-06 ENCOUNTER — Telehealth: Payer: Self-pay | Admitting: Adult Health

## 2022-01-06 NOTE — Telephone Encounter (Signed)
I spoke with Otila Kluver. After the patient is seen on 11/13 she would like the office note and an update order for the O2 sent over. She wants to make sure the patient is still using the O2. I told her we will fax it after the patient's appointment. Nothing further is needed.

## 2022-01-06 NOTE — Telephone Encounter (Signed)
Called and spoke to patient and advised her that we were changing her visit to a mychart video visit with TP. I told her she will get a link sent to her phone 15-20 minutes before appointment and it will walk her though it. Nothing further needed

## 2022-01-06 NOTE — Telephone Encounter (Signed)
That is fine , if she wants to come today can see her this evening in double book as I am here or double book on 11/28 when I come back . Whatever works best

## 2022-01-09 ENCOUNTER — Ambulatory Visit: Payer: Medicare Other | Admitting: Adult Health

## 2022-01-12 ENCOUNTER — Other Ambulatory Visit: Payer: Self-pay | Admitting: *Deleted

## 2022-01-12 ENCOUNTER — Telehealth (INDEPENDENT_AMBULATORY_CARE_PROVIDER_SITE_OTHER): Payer: Medicare Other | Admitting: Adult Health

## 2022-01-12 DIAGNOSIS — G4733 Obstructive sleep apnea (adult) (pediatric): Secondary | ICD-10-CM | POA: Diagnosis not present

## 2022-01-12 DIAGNOSIS — Z122 Encounter for screening for malignant neoplasm of respiratory organs: Secondary | ICD-10-CM

## 2022-01-12 DIAGNOSIS — F17210 Nicotine dependence, cigarettes, uncomplicated: Secondary | ICD-10-CM

## 2022-01-12 DIAGNOSIS — Z87891 Personal history of nicotine dependence: Secondary | ICD-10-CM

## 2022-01-12 NOTE — Patient Instructions (Addendum)
Continue on Dulera and Spiriva.  Albuterol inhaler As needed   Continue on Oxygen 3l/m with activity and At bedtime  with BIPAP  Continue on BIPAP At bedtime   Work on healthy weight loss.  Follow up with Dr. Jayme Cloud in 4 months and As needed

## 2022-01-12 NOTE — Progress Notes (Signed)
Virtual Visit via Video Note  I connected with Kristie Dorsey on 01/14/22 at 10:30 AM EST by a video enabled telemedicine application and verified that I am speaking with the correct person using two identifiers.  Location: Patient: Home  Provider: Office    I discussed the limitations of evaluation and management by telemedicine and the availability of in person appointments. The patient expressed understanding and agreed to proceed.  History of Present Illness: 61 year old female former smoker with COPD with emphysema, obstructive sleep apnea, OHS, chronic respiratory failure  Today's video visit is for a 17-month follow-up.  Patient has underlying COPD with emphysema, obstructive sleep apnea and OHS.  Chronic respiratory failure on home oxygen.  Patient says overall she has been doing okay.  She did get cold-like symptoms several weeks ago.  Symptoms seem to be improving and cough is almost resolved.  She has no fever or discolored mucus.  Patient declines a flu shot.  She remains on Dulera twice daily and Spiriva daily.  Uses albuterol on occasion.  Remains on oxygen 3 L with activity and at bedtime.  Patient remains on BiPAP at night.  She wears her BiPAP every single night.  Says she cannot sleep without it.  She feels that she benefits from BiPAP.  BiPAP download shows excellent compliance with 97% usage.  Daily average usage at 12 hours.  Patient is on BiPAP auto with IPAP max 18 cm H2O.  EPAP minimum 8 cm H2O.  Pressure support of 4, AHI of 1/hour.    Past Medical History:  Diagnosis Date   Abnormal uterine bleeding due to endometrial polyp    Anemia    Anginal pain (HCC)    Aortic atherosclerosis (HCC)    Arthritis    Cardiomyopathy (HCC)    a.) TTE 03/25/2018: EF 30-35%; b.) TTE 08/03/2019: EF 30%; c.) TTE 01/15/2020: EF 35%; d.) TTE 12/31/2020: EF 30%; e.) TTE 08/20/2021: EF 25%   Carotid stenosis    a.) s/p RIGHT CEA 06/20/2020   CHF (congestive heart failure) (HCC)    a.) TTE  03/25/18: EF 30-35%, diff HK, mild MR, sev AS (MPG 39), G1DD; b.) TTE 08/03/19: EF 30%, mild LVH, glob HK, mod LAE, mild MR, triv TR; c.) TTE 01/15/20: EF 35%, mild LVH, basal-apical/antsep/infsep HK, mild LAE, triv TR/PR, mod MR, G1DD; d.) TTE 12/31/20: EF 30%, mod glob HK, mod LAE, triv TR/PR, mild MR, G1DD; e.) TTE 08/20/21: EF 25%, mod MVE/LAE, glob HK, septal AK, MAC, triv TR, mild MR   Complication of anesthesia    a.) delayed emergence following AVR   COPD (chronic obstructive pulmonary disease) (HCC)    Coronary artery disease    Depression    Dyspnea    Hypertension    Hypothyroidism    LBBB (left bundle branch block)    Morbid obesity (HCC)    MRSA nasal colonization 06/20/2020   Nonrheumatic aortic (valve) stenosis    a.) TTE 03/25/2018: EF 30-35, sev AS (MPG 39 mmHg); b.) 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; 23 mm Edwards SAPIEN 3   Obesity hypoventilation syndrome (HCC)    On supplemental oxygen therapy    a.) 2-3 L/[place] as needed   OSA treated with BiPAP    T2DM (type 2 diabetes mellitus) (HCC)    Current Outpatient Medications on File Prior to Visit  Medication Sig Dispense Refill   albuterol (PROVENTIL HFA;VENTOLIN HFA) 108 (90 Base) MCG/ACT inhaler Inhale 2-4 puffs by mouth every 4 hours as needed for wheezing, cough, and/or  shortness of breath (Patient taking differently: Inhale 2 puffs into the lungs every 4 (four) hours as needed for wheezing or shortness of breath. Inhale 2-4 puffs by mouth every 4 hours as needed for wheezing, cough, and/or shortness of breath) 1 Inhaler 1   aspirin EC 81 MG EC tablet Take 1 tablet (81 mg total) by mouth daily at 6 (six) AM. Swallow whole. 90 tablet 3   atorvastatin (LIPITOR) 10 MG tablet TAKE 1 TABLET BY MOUTH ONCE DAILY. 90 tablet 0   buPROPion (WELLBUTRIN SR) 150 MG 12 hr tablet Take 150 mg by mouth 2 (two) times daily.     carvedilol (COREG) 12.5 MG tablet TAKE 1 TABLET BY MOUTH 2 TIMES A DAY WITH MEALS     dapagliflozin propanediol (FARXIGA) 10 MG  TABS tablet Take 10 mg by mouth daily.     ENTRESTO 97-103 MG Take 1 tablet by mouth 2 (two) times daily.     FLUoxetine (PROZAC) 40 MG capsule Take 40 mg by mouth daily.     fluticasone (FLONASE) 50 MCG/ACT nasal spray Place into both nostrils.     furosemide (LASIX) 40 MG tablet Take 40 mg by mouth daily.      levothyroxine (SYNTHROID, LEVOTHROID) 75 MCG tablet Take 75 mcg by mouth daily before breakfast.      megestrol (MEGACE) 40 MG tablet Take by mouth.     meloxicam (MOBIC) 15 MG tablet Take 15 mg by mouth daily.     mometasone-formoterol (DULERA) 200-5 MCG/ACT AERO Inhale 2 puffs into the lungs 2 (two) times daily. 1 each 6   Multiple Vitamins-Minerals (MULTIVITAMIN WITH MINERALS) tablet Take 1 tablet by mouth daily.     OXYGEN Inhale 2-3 L/min into the lungs as needed (dyspnea, COPD related symptoms, obsesity related hypoventilation syndrome).     OZEMPIC, 2 MG/DOSE, 8 MG/3ML SOPN Inject into the skin.     Spacer/Aero-Holding Chambers (AEROCHAMBER MV) inhaler Use as instructed 1 each 0   spironolactone (ALDACTONE) 25 MG tablet Take 25 mg by mouth daily.     Tiotropium Bromide Monohydrate (SPIRIVA RESPIMAT) 2.5 MCG/ACT AERS Inhale 2 puffs into the lungs daily. 4 g 6   traZODone (DESYREL) 100 MG tablet Take 100 mg by mouth at bedtime.     ferrous sulfate 325 (65 FE) MG tablet Take 325 mg by mouth daily with breakfast. (Patient not taking: Reported on 01/12/2022)     OZEMPIC, 0.25 OR 0.5 MG/DOSE, 2 MG/1.5ML SOPN Inject into the skin. (Patient not taking: Reported on 01/12/2022)     No current facility-administered medications on file prior to visit.       Observations/Objective: Appears in no acute distress  03/25/2018-echocardiogram-LV ejection fraction 30 to 35%, grade 1 diastolic dysfunction, mild aortic valve regurgitation,   LDCT chest March 07, 2020 showed lung RADS 2, trace pleural effusion, mild emphysema right upper lobe pulmonary nodule measuring 2.8 mm  Assessment and  Plan: COPD-currently compensated on present regimen.  Continue on maintenance inhalers.  Activity as tolerated.  Recent URI symptoms seem to be resolving.  Symptomatic treatment  Obstructive sleep apnea/OHS-excellent control and compliance on BiPAP with oxygen.  Chronic respiratory failure-continue on oxygen to obtain O2 saturations greater than 88 to 90%.  Continue on oxygen 3 L with activity and at bedtime with BiPAP  Plan  Patient Instructions  Continue on Dulera and Spiriva.  Albuterol inhaler As needed   Continue on Oxygen 3l/m with activity and At bedtime  with BIPAP  Continue on BIPAP  At bedtime   Work on healthy weight loss.  Follow up with Dr. Jayme Cloud in 4 months and As needed      Follow Up Instructions:    I discussed the assessment and treatment plan with the patient. The patient was provided an opportunity to ask questions and all were answered. The patient agreed with the plan and demonstrated an understanding of the instructions.   The patient was advised to call back or seek an in-person evaluation if the symptoms worsen or if the condition fails to improve as anticipated.  I provided 30  minutes of non-face-to-face time during this encounter.   Rubye Oaks, NP

## 2022-01-13 ENCOUNTER — Other Ambulatory Visit (INDEPENDENT_AMBULATORY_CARE_PROVIDER_SITE_OTHER): Payer: Self-pay

## 2022-01-13 MED ORDER — CLOPIDOGREL BISULFATE 75 MG PO TABS: 75.0000 mg | ORAL_TABLET | Freq: Every day | ORAL | 3 refills | Status: AC

## 2022-01-14 NOTE — Progress Notes (Signed)
Agree with the details of the visit as noted by Tammy Parrett, NP.  C. Laura Jex Strausbaugh, MD [facility] 

## 2022-01-26 ENCOUNTER — Ambulatory Visit
Admission: RE | Admit: 2022-01-26 | Discharge: 2022-01-26 | Disposition: A | Discharge status: Home / Self Care | Payer: Medicare Other | Source: Ambulatory Visit | Attending: Acute Care | Admitting: Acute Care

## 2022-01-26 DIAGNOSIS — F17210 Nicotine dependence, cigarettes, uncomplicated: Secondary | ICD-10-CM | POA: Insufficient documentation

## 2022-01-26 DIAGNOSIS — Z122 Encounter for screening for malignant neoplasm of respiratory organs: Secondary | ICD-10-CM | POA: Diagnosis present

## 2022-01-26 DIAGNOSIS — Z87891 Personal history of nicotine dependence: Secondary | ICD-10-CM | POA: Diagnosis present

## 2022-01-28 ENCOUNTER — Other Ambulatory Visit: Payer: Self-pay | Admitting: Acute Care

## 2022-01-28 DIAGNOSIS — Z87891 Personal history of nicotine dependence: Secondary | ICD-10-CM

## 2022-01-28 DIAGNOSIS — Z122 Encounter for screening for malignant neoplasm of respiratory organs: Secondary | ICD-10-CM

## 2022-01-28 DIAGNOSIS — F17210 Nicotine dependence, cigarettes, uncomplicated: Secondary | ICD-10-CM

## 2022-03-30 NOTE — Progress Notes (Unsigned)
Psychiatric Initial Adult Assessment   Patient Identification: Kristie Dorsey MRN:  [id number] Date of Evaluation:  04/02/2022 Referral Source: Letta Median, MD  Chief Complaint:   Chief Complaint  Patient presents with   Depression   Visit Diagnosis:    ICD-10-CM   1. MDD (major depressive disorder), recurrent episode, mild ([place])  F33.0     2. Insomnia, unspecified type  G47.00       History of Present Illness:   Kristie Dorsey is a 62 y.o. year old female with a history of COPD, chronic respiratory failure on home oxygen, OSA, chronic systolic CHF (EF 93%, NYHA III), s/p ICD, s/p TAVR, hypothyroidism, who is referred for depression.   (One of her daughters was with her during the visit with the patient consent.) She states that she has been suffering from depression since 38.  She has bouts of depression, having no energy and feeling hopeless, although she denies SI.  Her PCP adjusted her medication, and it has been helping some.  She spends time doing household chores.  She has limited activity due to arthritis.  Although she does enjoy the time with her family at home, she occasionally feels overwhelmed.  She talks about her son, who is loud and overbearing.  She thinks he does not have boundary, and he thinks he runs the house.  She has not gone to church for the past few years.  Although she wants to go there, doing things is different.  She also feels people are judging her when she is around with others, although she knows that they do not care in reality.   Depression- The patient has mood symptoms as in PHQ-9/GAD-7. She denies SI.  She has initial insomnia with ruminative thoughts. She sleeps 4-5 hours. She uses the machine regularly.   Medication- sertraline 100 mg daily, Rexulti 0.5 mg daily (on these for the past few weeks), trazodone 150 mg at night   Household: two daughters, and son (grandchildren- 5,7,9 months) Marital status: married Number of children:  8 Employment:  unemployed, on disability since 2022 due to depression, used to work as a Educational psychologist, Pharmacist, hospital, Scientist, clinical (histocompatibility and immunogenetics) Education:  bachelor's degree Last PCP / ongoing medical evaluation:  She was adopted at age 71. Both of her biological parents abused alcohol. Her mother was using alcohol when she was born   From 340 lbs  Wt Readings from Last 3 Encounters:  04/02/22 (!) 312 lb (141.5 kg)  01/26/22 (!) 308 lb (139.7 kg)  09/25/21 (!) 315 lb (142.9 kg)      Associated Signs/Symptoms: Depression Symptoms:  depressed mood, anhedonia, insomnia, fatigue, anxiety, (Hypo) Manic Symptoms:   denies decreased ned for sleep, euphoria Anxiety Symptoms:   mild anxiety Psychotic Symptoms:   denies AH, VH, paranoia PTSD Symptoms: Negative  Past Psychiatric History:  Outpatient:  Psychiatry admission: at least once for depression Previous suicide attempt: denies Past trials of medication:  fluoxetine, sertraline, bupropion, Abilify History of violence:  History of head injury:   Previous Psychotropic Medications: Yes   Substance Abuse History in the last 12 months:  No.  Consequences of Substance Abuse: NA  Past Medical History:  Past Medical History:  Diagnosis Date   Abnormal uterine bleeding due to endometrial polyp    Anemia    Anginal pain ([place])    Aortic atherosclerosis ([place])    Arthritis    Cardiomyopathy ([place])    a.) TTE 03/25/2018: EF 30-35%; b.) TTE 08/03/2019: EF 30%; c.) TTE  01/15/2020: EF 35%; d.) TTE 12/31/2020: EF 30%; e.) TTE 08/20/2021: EF 25%   Carotid stenosis    a.) s/p RIGHT CEA 06/20/2020   CHF (congestive heart failure) ([place])    a.) TTE 03/25/18: EF 30-35%, diff HK, mild MR, sev AS (MPG 39), G1DD; b.) TTE 08/03/19: EF 30%, mild LVH, glob HK, mod LAE, mild MR, triv TR; c.) TTE 01/15/20: EF 35%, mild LVH, basal-apical/antsep/infsep HK, mild LAE, triv TR/PR, mod MR, G1DD; d.) TTE 12/31/20: EF 30%, mod glob HK, mod LAE, triv TR/PR, mild MR, G1DD; e.)  TTE 08/20/21: EF 25%, mod MVE/LAE, glob HK, septal AK, MAC, triv TR, mild MR   Complication of anesthesia    a.) delayed emergence following AVR   COPD (chronic obstructive pulmonary disease) (H[facility])    Coronary artery disease    Depression    Dyspnea    Hypertension    Hypothyroidism    LBBB (left bundle branch block)    Morbid obesity ([place])    MRSA nasal colonization 06/20/2020   Nonrheumatic aortic (valve) stenosis    a.) TTE 03/25/2018: EF 30-35, sev AS (MPG 39 mmHg); b.) 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; 23 mm Edwards SAPIEN 3   Obesity hypoventilation syndrome ([place])    On supplemental oxygen therapy    a.) 2-3 L/[place] as needed   OSA treated with BiPAP    T2DM (type 2 diabetes mellitus) ([place])     Past Surgical History:  Procedure Laterality Date   CHOLECYSTECTOMY     DILATATION & CURETTAGE/HYSTEROSCOPY WITH MYOSURE N/A 09/25/2021   Procedure: FRACTIONAL DILATATION & CURETTAGE/HYSTEROSCOPY;  Surgeon: Schermerhorn, Gwen Her, MD;  Location: [facility];  Service: Gynecology;  Laterality: N/A;   ENDARTERECTOMY Right 06/20/2020   Procedure: ENDARTERECTOMY CAROTID;  Surgeon: Algernon Huxley, MD;  Location: [facility];  Service: Vascular;  Laterality: Right;   HYSTEROSCOPY WITH D & C N/A 02/28/2020   Procedure: DILATATION AND CURETTAGE;  Surgeon: Ward, Honor Loh, MD;  Location: [facility];  Service: Gynecology;  Laterality: N/A;   LEFT HEART CATH AND CORONARY ANGIOGRAPHY N/A 04/14/2018   Procedure: LEFT HEART CATH AND CORONARY ANGIOGRAPHY;  Surgeon: Yolonda Kida, MD;  Location: [facility];  Service: Cardiovascular;  Laterality: N/A;   TONSILLECTOMY     TRANSCATHETER AORTIC VALVE REPLACEMENT, TRANSAORTIC Right 11/01/2018   Procedure: TRANSCATHETER AORTIC VALVE REPLACEMENT, TRANSAORTIC via a RIGHT FEMORAL APPROACH; Location: [facility]; Surgeon: Marisa Severin, MD   TUBAL LIGATION      Family Psychiatric History: as below  Family History:  Family History  Adopted: Yes  Problem Relation Age of Onset    Alcohol abuse Mother    Aneurysm Mother    Alcohol abuse Father    Cancer Father    Depression Daughter     Social History:   Social History   Socioeconomic History   Marital status: Married    Spouse name: eugene   Number of children: 2   Years of education: Not on file   Highest education level: Not on file  Occupational History   Occupation: Freight forwarder    Comment: disability  Tobacco Use   Smoking status: Former    Packs/day: 1.50    Years: 46.00    Total pack years: 69.00    Types: Cigarettes    Start date: 03/02/1973    Quit date: 11/01/2018    Years since quitting: 3.4   Smokeless tobacco: Never   Tobacco comments:    5 cigarettes daily--04/02/22 - trying to quit  Vaping  Use   Vaping Use: Some days   Start date: 11/01/2018   Substances: Nicotine   Devices: trying to quit  Substance and Sexual Activity   Alcohol use: No   Drug use: No   Sexual activity: Not Currently  Other Topics Concern   Not on file  Social History Narrative   Patient lives with husband and 2 daughters. Has dogs.   Patient feels safe in her home.   She still vapes on a daily basis. Nicotine in her liquid.   Social Determinants of Health   Financial Resource Strain: Not on file  Food Insecurity: Not on file  Transportation Needs: Not on file  Physical Activity: Not on file  Stress: Not on file  Social Connections: Not on file    Additional Social History: as above  Allergies:  No Known Allergies  Metabolic Disorder Labs: No results found for: "HGBA1C", "MPG" No results found for: "PROLACTIN" No results found for: "CHOL", "TRIG", "HDL", "CHOLHDL", "VLDL", "LDLCALC" No results found for: "TSH"  Therapeutic Level Labs: No results found for: "LITHIUM" No results found for: "CBMZ" No results found for: "VALPROATE"  Current Medications: Current Outpatient Medications  Medication Sig Dispense Refill   albuterol (PROVENTIL HFA;VENTOLIN HFA) 108 (90 Base) MCG/ACT inhaler Inhale  2-4 puffs by mouth every 4 hours as needed for wheezing, cough, and/or shortness of breath (Patient taking differently: Inhale 2 puffs into the lungs every 4 (four) hours as needed for wheezing or shortness of breath. Inhale 2-4 puffs by mouth every 4 hours as needed for wheezing, cough, and/or shortness of breath) 1 Inhaler 1   atorvastatin (LIPITOR) 10 MG tablet TAKE 1 TABLET BY MOUTH ONCE DAILY. 90 tablet 0   carvedilol (COREG) 6.25 MG tablet Take 6.25 mg by mouth daily.     dapagliflozin propanediol (FARXIGA) 10 MG TABS tablet Take 10 mg by mouth daily.     ENTRESTO 97-103 MG Take 1 tablet by mouth 2 (two) times daily.     ferrous sulfate 325 (65 FE) MG tablet Take 325 mg by mouth daily with breakfast.     fluticasone (FLONASE) 50 MCG/ACT nasal spray Place into both nostrils.     furosemide (LASIX) 40 MG tablet Take 40 mg by mouth daily.      levothyroxine (SYNTHROID, LEVOTHROID) 75 MCG tablet Take 75 mcg by mouth daily before breakfast.      megestrol (MEGACE) 40 MG tablet Take by mouth.     meloxicam (MOBIC) 15 MG tablet Take 15 mg by mouth daily.     mometasone-formoterol (DULERA) 200-5 MCG/ACT AERO Inhale 2 puffs into the lungs 2 (two) times daily. 1 each 6   Multiple Vitamins-Minerals (MULTIVITAMIN WITH MINERALS) tablet Take 1 tablet by mouth daily.     OXYGEN Inhale 2-3 L/min into the lungs as needed (dyspnea, COPD related symptoms, obsesity related hypoventilation syndrome).     OZEMPIC, 0.25 OR 0.5 MG/DOSE, 2 MG/1.5ML SOPN Inject into the skin.     OZEMPIC, 2 MG/DOSE, 8 MG/3ML SOPN Inject into the skin.     REXULTI 0.25 MG TABS tablet Take 0.25 mg by mouth daily.     sertraline (ZOLOFT) 100 MG tablet Take 100 mg by mouth daily.     Spacer/Aero-Holding Chambers (AEROCHAMBER MV) inhaler Use as instructed 1 each 0   spironolactone (ALDACTONE) 25 MG tablet Take 25 mg by mouth daily.     Tiotropium Bromide Monohydrate (SPIRIVA RESPIMAT) 2.5 MCG/ACT AERS Inhale 2 puffs into the lungs daily.  4 g  6   traZODone (DESYREL) 50 MG tablet Take 150 mg by mouth at bedtime.     zolpidem (AMBIEN) 5 MG tablet Take 1 tablet (5 mg total) by mouth at bedtime as needed for sleep. 30 tablet 1   No current facility-administered medications for this visit.    Musculoskeletal: Strength & Muscle Tone: within normal limits Gait & Station:  N/A (in a wheel chair) Patient leans: N/A  Psychiatric Specialty Exam: Review of Systems  Psychiatric/Behavioral:  Positive for dysphoric mood and sleep disturbance. Negative for agitation, behavioral problems, confusion, decreased concentration, hallucinations, self-injury and suicidal ideas. The patient is nervous/anxious. The patient is not hyperactive.   All other systems reviewed and are negative.   Blood pressure 119/78, pulse 77, temperature 97.9 F (36.6 C), height 5' 1"  (1.549 m), weight (!) 312 lb (141.5 kg), last menstrual period 02/21/2020, SpO2 94 %.Body mass index is 58.95 kg/m.  General Appearance: Fairly Groomed  Eye Contact:  Good  Speech:  Clear and Coherent  Volume:  Normal  Mood:  Depressed  Affect:  Appropriate, Congruent, and calm  Thought Process:  Coherent  Orientation:  Full (Time, Place, and Person)  Thought Content:  Logical  Suicidal Thoughts:  No  Homicidal Thoughts:  No  Memory:  Immediate;   Good  Judgement:  Good  Insight:  Good  Psychomotor Activity:  Normal, Normal tone, no rigidity, no resting/postural tremors, no tardive dyskinesia    Concentration:  Concentration: Good and Attention Span: Good  Recall:  Good  Fund of Knowledge:Good  Language: Good  Akathisia:  No  Handed:  Right  AIMS (if indicated):  not done  Assets:  Communication Skills Desire for Improvement  ADL's:  Intact  Cognition: WNL  Sleep:  Poor   Screenings: GAD-7    Flowsheet Row Office Visit from 04/02/2022 in [facility]  Total GAD-7 Score 4      PHQ2-9    [place] Office Visit from  04/02/2022 in [facility]  PHQ-2 Total Score 2  PHQ-9 Total Score 9      [place] Office Visit from 04/02/2022 in [facility] Admission (Discharged) from 09/25/2021 in [facility] 44 from 09/18/2021 in [facility] No Risk No Risk No Risk       Assessment and Plan:  LATONJA BOBECK is a 62 y.o. year old female with a history of COPD, chronic respiratory failure on home oxygen, OSA, chronic systolic CHF (EF 58%, NYHA III), s/p ICD, s/p TAVR, hypothyroidism, who is referred for depression.    1. MDD (major depressive disorder), recurrent episode, mild ([place]) Acute stressors include: Conflict with her son at home, feeling overcrowded in the house Other stressors include: Adoption at age 46 (both of her parents were abusing alcohol including the time she was born), unemployment (on disability), arthritis History: depression since age 50   She reports worsening of depression symptoms due to acute stressors, as mentioned above, although it has been improving since recent adjustment by her PCP.  Will continue current dose of sertraline and rexulti given she reports good benefit.  Noted that she has QTc prolongation; will use rexulti judiciously, especially given her additional risks, including cardiac disease. Planning to defer checking another EKG until her next appointment with the cardiologist in a month at this time.  2. Insomnia, unspecified type She reports  initial insomnia, and has limited benefit from trazodone. Will try Ambien as needed for insomnia for short term use.  Discussed potential risk of drowsiness, sleep behavior.  She was advised to withhold trazodone.   Plan Continue sertraline 100 mg daily  Continue rexulti 0.5 mg daily (QTc 486 msec on 11/2021, Biventricular pacemaker  rhythm has replaced Normal sinus rhythm) Start Ambien 5 mg at night as needed for insomnia Discontinue trazodone (was on 150 mg) Referral for therapy  Next appointment: 4/2 at 10:30, in person  The patient demonstrates the following risk factors for suicide: Chronic risk factors for suicide include: psychiatric disorder of depression . Acute risk factors for suicide include: family or marital conflict and unemployment. Protective factors for this patient include: positive social support. Considering these factors, the overall suicide risk at this point appears to be low. Patient is appropriate for outpatient follow up.     Collaboration of Care: Other reviewed notes in Epic  Patient/Guardian was advised Release of Information must be obtained prior to any record release in order to collaborate their care with an outside provider. Patient/Guardian was advised if they have not already done so to contact the registration department to sign all necessary forms in order for [place] to release information regarding their care.   Consent: Patient/Guardian gives verbal consent for treatment and assignment of benefits for services provided during this visit. Patient/Guardian expressed understanding and agreed to proceed.   Norman Clay, MD 2/1/202411:08 AM

## 2022-04-02 ENCOUNTER — Encounter: Payer: Self-pay | Admitting: Psychiatry

## 2022-04-02 ENCOUNTER — Ambulatory Visit (INDEPENDENT_AMBULATORY_CARE_PROVIDER_SITE_OTHER): Payer: 59 | Admitting: Psychiatry

## 2022-04-02 VITALS — BP 119/78 | HR 77 | Temp 97.9°F | Ht 61.0 in | Wt 312.0 lb

## 2022-04-02 DIAGNOSIS — F330 Major depressive disorder, recurrent, mild: Secondary | ICD-10-CM

## 2022-04-02 DIAGNOSIS — G4700 Insomnia, unspecified: Secondary | ICD-10-CM

## 2022-04-02 MED ORDER — ZOLPIDEM TARTRATE 5 MG PO TABS: 5.0000 mg | ORAL_TABLET | Freq: Every evening | ORAL | 1 refills | Status: DC | PRN

## 2022-04-02 NOTE — Patient Instructions (Signed)
Continue sertraline 100 mg daily  Continue rexulti 0.5 mg daily  Start Ambien 5 mg at night as needed for insomnia Discontinue trazodone  Referral for therapy  Next appointment: 4/2 at 10:30

## 2022-04-08 ENCOUNTER — Telehealth: Payer: Self-pay

## 2022-04-08 NOTE — Telephone Encounter (Signed)
pt left a message that the sleeping pill is not working and it is making her sick. pt was seen on 04-02-22 next appt 06-02-22

## 2022-04-08 NOTE — Telephone Encounter (Signed)
Pt notified and given information

## 2022-04-08 NOTE — Telephone Encounter (Signed)
called patient back to get more details and she states that she has been N/V and not sleeping, she states her nerves is got her all keyed up and it is hard for her to rest.

## 2022-04-08 NOTE — Telephone Encounter (Signed)
Please advise her to discontinue Ambien. Additionally, recommend that she contacts her primary care provider if her symptoms persist despite discontinuing Ambien.

## 2022-05-30 NOTE — Progress Notes (Deleted)
[facility] MD/PA/NP OP Progress Note  05/30/2022 5:08 PM Kristie Dorsey  MRN:  [id number]  Chief Complaint: No chief complaint on file.  HPI: ***  Ambien- nausea, vomiting     Household: two daughters, and son (grandchildren- 5,7,9 months) Marital status: married Number of children: 8 Employment:  unemployed, on disability since 2022 due to depression, used to work as a Educational psychologist, Pharmacist, hospital, Scientist, clinical (histocompatibility and immunogenetics) Education:  bachelor's degree Last PCP / ongoing medical evaluation:  She was adopted at age 83. Both of her biological parents abused alcohol. Her mother was using alcohol when she was born  Visit Diagnosis: No diagnosis found.  Past Psychiatric History: Please see initial evaluation for full details. I have reviewed the history. No updates at this time.     Past Medical History:  Past Medical History:  Diagnosis Date   Abnormal uterine bleeding due to endometrial polyp    Anemia    Anginal pain ([place])    Aortic atherosclerosis ([place])    Arthritis    Cardiomyopathy ([place])    a.) TTE 03/25/2018: EF 30-35%; b.) TTE 08/03/2019: EF 30%; c.) TTE 01/15/2020: EF 35%; d.) TTE 12/31/2020: EF 30%; e.) TTE 08/20/2021: EF 25%   Carotid stenosis    a.) s/p RIGHT CEA 06/20/2020   CHF (congestive heart failure) ([place])    a.) TTE 03/25/18: EF 30-35%, diff HK, mild MR, sev AS (MPG 39), G1DD; b.) TTE 08/03/19: EF 30%, mild LVH, glob HK, mod LAE, mild MR, triv TR; c.) TTE 01/15/20: EF 35%, mild LVH, basal-apical/antsep/infsep HK, mild LAE, triv TR/PR, mod MR, G1DD; d.) TTE 12/31/20: EF 30%, mod glob HK, mod LAE, triv TR/PR, mild MR, G1DD; e.) TTE 08/20/21: EF 25%, mod MVE/LAE, glob HK, septal AK, MAC, triv TR, mild MR   Complication of anesthesia    a.) delayed emergence following AVR   COPD (chronic obstructive pulmonary disease) (H[facility])    Coronary artery disease    Depression    Dyspnea    Hypertension    Hypothyroidism    LBBB (left bundle branch block)    Morbid obesity ([place])    MRSA nasal  colonization 06/20/2020   Nonrheumatic aortic (valve) stenosis    a.) TTE 03/25/2018: EF 30-35, sev AS (MPG 39 mmHg); b.) 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; 23 mm Edwards SAPIEN 3   Obesity hypoventilation syndrome ([place])    On supplemental oxygen therapy    a.) 2-3 L/[place] as needed   OSA treated with BiPAP    T2DM (type 2 diabetes mellitus) ([place])     Past Surgical History:  Procedure Laterality Date   CHOLECYSTECTOMY     DILATATION & CURETTAGE/HYSTEROSCOPY WITH MYOSURE N/A 09/25/2021   Procedure: FRACTIONAL DILATATION & CURETTAGE/HYSTEROSCOPY;  Surgeon: Schermerhorn, Gwen Her, MD;  Location: [facility];  Service: Gynecology;  Laterality: N/A;   ENDARTERECTOMY Right 06/20/2020   Procedure: ENDARTERECTOMY CAROTID;  Surgeon: Algernon Huxley, MD;  Location: [facility];  Service: Vascular;  Laterality: Right;   HYSTEROSCOPY WITH D & C N/A 02/28/2020   Procedure: DILATATION AND CURETTAGE;  Surgeon: Ward, Honor Loh, MD;  Location: [facility];  Service: Gynecology;  Laterality: N/A;   LEFT HEART CATH AND CORONARY ANGIOGRAPHY N/A 04/14/2018   Procedure: LEFT HEART CATH AND CORONARY ANGIOGRAPHY;  Surgeon: Yolonda Kida, MD;  Location: [facility];  Service: Cardiovascular;  Laterality: N/A;   TONSILLECTOMY     TRANSCATHETER AORTIC VALVE REPLACEMENT, TRANSAORTIC Right 11/01/2018   Procedure: TRANSCATHETER AORTIC VALVE REPLACEMENT, TRANSAORTIC via a RIGHT FEMORAL  APPROACH; Location: [facility]; Surgeon: Marisa Severin, MD   TUBAL LIGATION      Family Psychiatric History: Please see initial evaluation for full details. I have reviewed the history. No updates at this time.     Family History:  Family History  Adopted: Yes  Problem Relation Age of Onset   Alcohol abuse Mother    Aneurysm Mother    Alcohol abuse Father    Cancer Father    Depression Daughter     Social History:  Social History   Socioeconomic History   Marital status: Married    Spouse name: eugene   Number of children: 2   Years of  education: Not on file   Highest education level: Not on file  Occupational History   Occupation: Freight forwarder    Comment: disability  Tobacco Use   Smoking status: Former    Packs/day: 1.50    Years: 46.00    Additional pack years: 0.00    Total pack years: 69.00    Types: Cigarettes    Start date: 03/02/1973    Quit date: 11/01/2018    Years since quitting: 3.5   Smokeless tobacco: Never   Tobacco comments:    5 cigarettes daily--04/02/22 - trying to quit  Vaping Use   Vaping Use: Some days   Start date: 11/01/2018   Substances: Nicotine   Devices: trying to quit  Substance and Sexual Activity   Alcohol use: No   Drug use: No   Sexual activity: Not Currently  Other Topics Concern   Not on file  Social History Narrative   Patient lives with husband and 2 daughters. Has dogs.   Patient feels safe in her home.   She still vapes on a daily basis. Nicotine in her liquid.   Social Determinants of Health   Financial Resource Strain: Not on file  Food Insecurity: Not on file  Transportation Needs: Not on file  Physical Activity: Not on file  Stress: Not on file  Social Connections: Not on file    Allergies: No Known Allergies  Metabolic Disorder Labs: No results found for: "HGBA1C", "MPG" No results found for: "PROLACTIN" No results found for: "CHOL", "TRIG", "HDL", "CHOLHDL", "VLDL", "LDLCALC" No results found for: "TSH"  Therapeutic Level Labs: No results found for: "LITHIUM" No results found for: "VALPROATE" No results found for: "CBMZ"  Current Medications: Current Outpatient Medications  Medication Sig Dispense Refill   albuterol (PROVENTIL HFA;VENTOLIN HFA) 108 (90 Base) MCG/ACT inhaler Inhale 2-4 puffs by mouth every 4 hours as needed for wheezing, cough, and/or shortness of breath (Patient taking differently: Inhale 2 puffs into the lungs every 4 (four) hours as needed for wheezing or shortness of breath. Inhale 2-4 puffs by mouth every 4 hours as needed for  wheezing, cough, and/or shortness of breath) 1 Inhaler 1   atorvastatin (LIPITOR) 10 MG tablet TAKE 1 TABLET BY MOUTH ONCE DAILY. 90 tablet 0   carvedilol (COREG) 6.25 MG tablet Take 6.25 mg by mouth daily.     dapagliflozin propanediol (FARXIGA) 10 MG TABS tablet Take 10 mg by mouth daily.     ENTRESTO 97-103 MG Take 1 tablet by mouth 2 (two) times daily.     ferrous sulfate 325 (65 FE) MG tablet Take 325 mg by mouth daily with breakfast.     fluticasone (FLONASE) 50 MCG/ACT nasal spray Place into both nostrils.     furosemide (LASIX) 40 MG tablet Take 40 mg by mouth daily.  levothyroxine (SYNTHROID, LEVOTHROID) 75 MCG tablet Take 75 mcg by mouth daily before breakfast.      megestrol (MEGACE) 40 MG tablet Take by mouth.     meloxicam (MOBIC) 15 MG tablet Take 15 mg by mouth daily.     mometasone-formoterol (DULERA) 200-5 MCG/ACT AERO Inhale 2 puffs into the lungs 2 (two) times daily. 1 each 6   Multiple Vitamins-Minerals (MULTIVITAMIN WITH MINERALS) tablet Take 1 tablet by mouth daily.     OXYGEN Inhale 2-3 L/min into the lungs as needed (dyspnea, COPD related symptoms, obsesity related hypoventilation syndrome).     OZEMPIC, 0.25 OR 0.5 MG/DOSE, 2 MG/1.5ML SOPN Inject into the skin.     OZEMPIC, 2 MG/DOSE, 8 MG/3ML SOPN Inject into the skin.     REXULTI 0.25 MG TABS tablet Take 0.25 mg by mouth daily.     sertraline (ZOLOFT) 100 MG tablet Take 100 mg by mouth daily.     Spacer/Aero-Holding Chambers (AEROCHAMBER MV) inhaler Use as instructed 1 each 0   spironolactone (ALDACTONE) 25 MG tablet Take 25 mg by mouth daily.     Tiotropium Bromide Monohydrate (SPIRIVA RESPIMAT) 2.5 MCG/ACT AERS Inhale 2 puffs into the lungs daily. 4 g 6   traZODone (DESYREL) 50 MG tablet Take 150 mg by mouth at bedtime.     zolpidem (AMBIEN) 5 MG tablet Take 1 tablet (5 mg total) by mouth at bedtime as needed for sleep. 30 tablet 1   No current facility-administered medications for this visit.      Musculoskeletal: Strength & Muscle Tone: within normal limits Gait & Station: normal Patient leans: N/A  Psychiatric Specialty Exam: Review of Systems  Last menstrual period 02/21/2020.There is no height or weight on file to calculate BMI.  General Appearance: {Appearance:22683}  Eye Contact:  {BHH EYE CONTACT:22684}  Speech:  Clear and Coherent  Volume:  Normal  Mood:  {BHH MOOD:22306}  Affect:  {Affect (PAA):22687}  Thought Process:  Coherent  Orientation:  Full (Time, Place, and Person)  Thought Content: Logical   Suicidal Thoughts:  {ST/HT (PAA):22692}  Homicidal Thoughts:  {ST/HT (PAA):22692}  Memory:  Immediate;   Good  Judgement:  {Judgement (PAA):22694}  Insight:  {Insight (PAA):22695}  Psychomotor Activity:  Normal  Concentration:  Concentration: Good and Attention Span: Good  Recall:  Good  Fund of Knowledge: Good  Language: Good  Akathisia:  No  Handed:  Right  AIMS (if indicated): not done  Assets:  Communication Skills Desire for Improvement  ADL's:  Intact  Cognition: WNL  Sleep:  {BHH GOOD/FAIR/POOR:22877}   Screenings: GAD-7    Flowsheet Row Office Visit from 04/02/2022 in [facility]  Total GAD-7 Score 4      PHQ2-9    [place] Office Visit from 04/02/2022 in [facility]  PHQ-2 Total Score 2  PHQ-9 Total Score 9      [place] Office Visit from 04/02/2022 in [facility] Admission (Discharged) from 09/25/2021 in [facility] from 09/18/2021 in [facility] No Risk No Risk No Risk        Assessment and Plan:  Kristie Dorsey is a 62 y.o. year old female with a history of COPD, chronic respiratory failure on home oxygen, OSA, chronic systolic CHF (EF 123456, NYHA III), s/p ICD, s/p TAVR,  hypothyroidism, who is referred for depression.  1. MDD (major depressive disorder), recurrent episode, mild ([place]) Acute stressors include: Conflict with her son at home, feeling overcrowded in the house Other stressors include: Adoption at age 14 (both of her parents were abusing alcohol including the time she was born), unemployment (on disability), arthritis History: depression since age 57   She reports worsening of depression symptoms due to acute stressors, as mentioned above, although it has been improving since recent adjustment by her PCP.  Will continue current dose of sertraline and rexulti given she reports good benefit.  Noted that she has QTc prolongation; will use rexulti judiciously, especially given her additional risks, including cardiac disease. Planning to defer checking another EKG until her next appointment with the cardiologist in a month at this time.   2. Insomnia, unspecified type She reports initial insomnia, and has limited benefit from trazodone. Will try Ambien as needed for insomnia for short term use.  Discussed potential risk of drowsiness, sleep behavior.  She was advised to withhold trazodone.    Plan Continue sertraline 100 mg daily  Continue rexulti 0.5 mg daily (QTc 486 msec on 11/2021, Biventricular pacemaker rhythm has replaced Normal sinus rhythm) Start Ambien 5 mg at night as needed for insomnia Discontinue trazodone (was on 150 mg) Referral for therapy  Next appointment: 4/2 at 10:30, in person   The patient demonstrates the following risk factors for suicide: Chronic risk factors for suicide include: psychiatric disorder of depression . Acute risk factors for suicide include: family or marital conflict and unemployment. Protective factors for this patient include: positive social support. Considering these factors, the overall suicide risk at this point appears to be low. Patient is appropriate for outpatient follow up.   Collaboration of Care:  Collaboration of Care: {BH OP Collaboration of Care:21014065}  Patient/Guardian was advised Release of Information must be obtained prior to any record release in order to collaborate their care with an outside provider. Patient/Guardian was advised if they have not already done so to contact the registration department to sign all necessary forms in order for [place] to release information regarding their care.   Consent: Patient/Guardian gives verbal consent for treatment and assignment of benefits for services provided during this visit. Patient/Guardian expressed understanding and agreed to proceed.    Norman Clay, MD 05/30/2022, 5:08 PM

## 2022-06-02 ENCOUNTER — Ambulatory Visit: Payer: No Typology Code available for payment source | Admitting: Psychiatry

## 2022-06-08 ENCOUNTER — Other Ambulatory Visit (INDEPENDENT_AMBULATORY_CARE_PROVIDER_SITE_OTHER): Payer: Self-pay | Admitting: Nurse Practitioner

## 2022-06-18 ENCOUNTER — Other Ambulatory Visit (INDEPENDENT_AMBULATORY_CARE_PROVIDER_SITE_OTHER): Payer: Self-pay | Admitting: Nurse Practitioner

## 2022-06-18 DIAGNOSIS — I6523 Occlusion and stenosis of bilateral carotid arteries: Secondary | ICD-10-CM

## 2022-06-22 NOTE — Progress Notes (Unsigned)
Virtual Visit via Video Note  I connected with Kristie Dorsey on 06/25/22 at 10:30 AM EDT by a video enabled telemedicine application and verified that I am speaking with the correct person using two identifiers.  Location: Patient: home Provider: office Persons participated in the visit- patient, provider    I discussed the limitations of evaluation and management by telemedicine and the availability of in person appointments. The patient expressed understanding and agreed to proceed.    I discussed the assessment and treatment plan with the patient. The patient was provided an opportunity to ask questions and all were answered. The patient agreed with the plan and demonstrated an understanding of the instructions.   The patient was advised to call back or seek an in-person evaluation if the symptoms worsen or if the condition fails to improve as anticipated.  I provided 18 minutes of non-face-to-face time during this encounter.   Neysa Hotter, MD     [facility] MD/PA/NP OP Progress Note  06/25/2022 11:04 AM Kristie Dorsey  MRN:  [id number]  Chief Complaint:  Chief Complaint  Patient presents with   Follow-up   HPI:  This is a follow-up appointment for depression, anxiety and insomnia.  She states that she has been doing better.  She does not have bouts of depression compared to before.  She thinks things are positive.  She has lost weight since being on Ozempic, and has been working on diet.  She wishes to go to store and the church.  She does not go there anymore due to knee pain.  She also feels self-conscious of her appearance due to her weight.  She feels like she is not enjoying her life.  She is consenting to go to a pool in the summer.  She believes there is a better outlook.  She states that she was doing much better when she used to be on the higher dose of rexulti.  She is willing to uptitrate sertraline first to see how it will be more effective.  She cannot continue Ambien  due to she feels sick in her stomach.  She sleeps good with trazodone.  She feels anxious, and irritable at times.  She has not had any panic attacks.  She is hoping to work on smoking cessation.   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 Both of her biological parents abused alcohol. Her mother was using alcohol when she was born  Visit Diagnosis:    ICD-10-CM   1. MDD (major depressive disorder), recurrent episode, mild  F33.0     2. GAD (generalized anxiety disorder)  F41.1     3. Insomnia, unspecified type  G47.00       Past Psychiatric History: Please see initial evaluation for full details. I have reviewed the history. No updates at this time.     Past Medical History:  Past Medical History:  Diagnosis Date   Abnormal uterine bleeding due to endometrial polyp    Anemia    Anginal pain (HCC)  Aortic atherosclerosis (HCC)    Arthritis    Cardiomyopathy (HCC)    a.) TTE 03/25/2018: EF 30-35%; b.) TTE 08/03/2019: EF 30%; c.) TTE 01/15/2020: EF 35%; d.) TTE 12/31/2020: EF 30%; e.) TTE 08/20/2021: EF 25%   Carotid stenosis    a.) s/p RIGHT CEA 06/20/2020   CHF (congestive heart failure) (HCC)    a.) TTE 03/25/18: EF 30-35%, diff HK, mild MR, sev AS (MPG 39), G1DD; b.) TTE 08/03/19: EF 30%, mild LVH, glob HK, mod LAE, mild MR, triv TR; c.) TTE 01/15/20: EF 35%, mild LVH, basal-apical/antsep/infsep HK, mild LAE, triv TR/PR, mod MR, G1DD; d.) TTE 12/31/20: EF 30%, mod glob HK, mod LAE, triv  TR/PR, mild MR, G1DD; e.) TTE 08/20/21: EF 25%, mod MVE/LAE, glob HK, septal AK, MAC, triv TR, mild MR   Complication of anesthesia    a.) delayed emergence following AVR   COPD (chronic obstructive pulmonary disease) (HCC)    Coronary artery disease    Depression    Dyspnea    Hypertension    Hypothyroidism    LBBB (left bundle branch block)    Morbid obesity (HCC)    MRSA nasal colonization 06/20/2020   Nonrheumatic aortic (valve) stenosis    a.) TTE 03/25/2018: EF 30-35, sev AS (MPG 39 mmHg); b.) 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; 23 mm Edwards SAPIEN 3   Obesity hypoventilation syndrome (HCC)    On supplemental oxygen therapy    a.) 2-3 L/[place] as needed   OSA treated with BiPAP    T2DM (type 2 diabetes mellitus) (HCC)     Past Surgical History:  Procedure Laterality Date   CHOLECYSTECTOMY     DILATATION & CURETTAGE/HYSTEROSCOPY WITH MYOSURE N/A 09/25/2021   Procedure: FRACTIONAL DILATATION & CURETTAGE/HYSTEROSCOPY;  Surgeon: Schermerhorn, Ihor Austin, MD;  Location: [facility];  Service: Gynecology;  Laterality: N/A;   ENDARTERECTOMY Right 06/20/2020   Procedure: ENDARTERECTOMY CAROTID;  Surgeon: Annice Needy, MD;  Location: [facility];  Service: Vascular;  Laterality: Right;   HYSTEROSCOPY WITH D & C N/A 02/28/2020   Procedure: DILATATION AND CURETTAGE;  Surgeon: Ward, Elenora Fender, MD;  Location: [facility];  Service: Gynecology;  Laterality: N/A;   LEFT HEART CATH AND CORONARY ANGIOGRAPHY N/A 04/14/2018   Procedure: LEFT HEART CATH AND CORONARY ANGIOGRAPHY;  Surgeon: Alwyn Pea, MD;  Location: [facility];  Service: Cardiovascular;  Laterality: N/A;   TONSILLECTOMY     TRANSCATHETER AORTIC VALVE REPLACEMENT, TRANSAORTIC Right 11/01/2018   Procedure: TRANSCATHETER AORTIC VALVE REPLACEMENT, TRANSAORTIC via a RIGHT FEMORAL APPROACH; Location: [facility]; Surgeon: Elyn Peers, MD   TUBAL LIGATION      Family Psychiatric History: Please see initial evaluation for full details. I have reviewed  the history. No updates at this time.     Family History:  Family History  Adopted: Yes  Problem Relation Age of Onset   Alcohol abuse Mother    Aneurysm Mother    Alcohol abuse Father    Cancer Father    Depression Daughter     Social History:  Social History   Socioeconomic History   Marital status: Married    Spouse name: eugene   Number of children: 2   Years of education: Not on file   Highest education level: Not on file  Occupational History   Occupation: Designer, multimedia    Comment: disability  Tobacco Use   Smoking status: Former    Packs/day: 1.50    Years: 46.00    Additional pack years: 0.00  Total pack years: 69.00    Types: Cigarettes    Start date: 03/02/1973    Quit date: 11/01/2018    Years since quitting: 3.6   Smokeless tobacco: Never   Tobacco comments:    5 cigarettes daily--04/02/22 - trying to quit  Vaping Use   Vaping Use: Some days   Start date: 11/01/2018   Substances: Nicotine   Devices: trying to quit  Substance and Sexual Activity   Alcohol use: No   Drug use: No   Sexual activity: Not Currently  Other Topics Concern   Not on file  Social History Narrative   Patient lives with husband and 2 daughters. Has dogs.   Patient feels safe in her home.   She still vapes on a daily basis. Nicotine in her liquid.   Social Determinants of Health   Financial Resource Strain: Not on file  Food Insecurity: Not on file  Transportation Needs: Not on file  Physical Activity: Not on file  Stress: Not on file  Social Connections: Not on file    Allergies: No Known Allergies  Metabolic Disorder Labs: No results found for: "HGBA1C", "MPG" No results found for: "PROLACTIN" No results found for: "CHOL", "TRIG", "HDL", "CHOLHDL", "VLDL", "LDLCALC" No results found for: "TSH"  Therapeutic Level Labs: No results found for: "LITHIUM" No results found for: "VALPROATE" No results found for: "CBMZ"  Current Medications: Current Outpatient  Medications  Medication Sig Dispense Refill   acetaminophen (TYLENOL) 325 MG tablet Take by mouth.     albuterol (PROVENTIL HFA;VENTOLIN HFA) 108 (90 Base) MCG/ACT inhaler Inhale 2-4 puffs by mouth every 4 hours as needed for wheezing, cough, and/or shortness of breath (Patient taking differently: Inhale 2 puffs into the lungs every 4 (four) hours as needed for wheezing or shortness of breath. Inhale 2-4 puffs by mouth every 4 hours as needed for wheezing, cough, and/or shortness of breath) 1 Inhaler 1   atorvastatin (LIPITOR) 10 MG tablet TAKE 1 TABLET BY MOUTH ONCE DAILY. 90 tablet 2   carvedilol (COREG) 6.25 MG tablet Take 6.25 mg by mouth daily.     dapagliflozin propanediol (FARXIGA) 10 MG TABS tablet Take 10 mg by mouth daily.     ENTRESTO 97-103 MG Take 1 tablet by mouth 2 (two) times daily.     ferrous sulfate 325 (65 FE) MG tablet Take 325 mg by mouth daily with breakfast. (Patient not taking: Reported on 06/23/2022)     fluticasone (FLONASE) 50 MCG/ACT nasal spray Place into both nostrils.     furosemide (LASIX) 40 MG tablet Take 40 mg by mouth daily.      levothyroxine (SYNTHROID, LEVOTHROID) 75 MCG tablet Take 75 mcg by mouth daily before breakfast.      meloxicam (MOBIC) 15 MG tablet Take 15 mg by mouth daily.     mometasone-formoterol (DULERA) 200-5 MCG/ACT AERO Inhale 2 puffs into the lungs 2 (two) times daily. 1 each 6   Multiple Vitamins-Minerals (MULTIVITAMIN WITH MINERALS) tablet Take 1 tablet by mouth daily.     OXYGEN Inhale 2-3 L/min into the lungs as needed (dyspnea, COPD related symptoms, obsesity related hypoventilation syndrome).     OZEMPIC, 0.25 OR 0.5 MG/DOSE, 2 MG/1.5ML SOPN Inject into the skin.     OZEMPIC, 2 MG/DOSE, 8 MG/3ML SOPN Inject into the skin.     progesterone (PROMETRIUM) 200 MG capsule Take 200 mg by mouth at bedtime.     REXULTI 0.25 MG TABS tablet Take 0.25 mg by mouth daily.  sertraline (ZOLOFT) 100 MG tablet Take 100 mg by mouth daily.      Spacer/Aero-Holding Chambers (AEROCHAMBER MV) inhaler Use as instructed 1 each 0   spironolactone (ALDACTONE) 25 MG tablet Take 25 mg by mouth daily.     Tiotropium Bromide Monohydrate (SPIRIVA RESPIMAT) 2.5 MCG/ACT AERS Inhale 2 puffs into the lungs daily. 4 g 6   traMADol (ULTRAM) 50 MG tablet Take 50 mg by mouth daily as needed.     traZODone (DESYREL) 50 MG tablet Take 150 mg by mouth at bedtime.     No current facility-administered medications for this visit.     Musculoskeletal: Strength & Muscle Tone:  N/A Gait & Station:  N/A Patient leans: N/A  Psychiatric Specialty Exam: Review of Systems  Psychiatric/Behavioral:  Positive for dysphoric mood. Negative for agitation, behavioral problems, confusion, decreased concentration, hallucinations, self-injury, sleep disturbance and suicidal ideas. The patient is nervous/anxious. The patient is not hyperactive.   All other systems reviewed and are negative.   Last menstrual period 02/21/2020.There is no height or weight on file to calculate BMI.  General Appearance: Fairly Groomed  Eye Contact:  Good  Speech:  Clear and Coherent  Volume:  Normal  Mood:   better  Affect:  Appropriate, Congruent, and calm  Thought Process:  Coherent  Orientation:  Full (Time, Place, and Person)  Thought Content: Logical   Suicidal Thoughts:  No  Homicidal Thoughts:  No  Memory:  Immediate;   Good  Judgement:  Good  Insight:  Good  Psychomotor Activity:  Normal  Concentration:  Concentration: Good and Attention Span: Good  Recall:  Good  Fund of Knowledge: Good  Language: Good  Akathisia:  No  Handed:  Right  AIMS (if indicated): not done  Assets:  Communication Skills Desire for Improvement  ADL's:  Intact  Cognition: WNL  Sleep:  Good   Screenings: GAD-7    Flowsheet Row Office Visit from [facility]02/2022 in [facility]  Total GAD-7 Score 4      PHQ2-9    Flowsheet Row Office Visit from  04/02/2022 in [facility]  PHQ-2 Total Score 2  PHQ-9 Total Score 9      Flowsheet Row Office Visit from 04/02/2022 in [facility] Admission (Discharged) from 09/25/2021 in [facility] Pre-Admission Testing 44 from 09/18/2021 in [facility]  C-SSRS RISK CATEGORY No Risk No Risk No Risk        Assessment and Plan:  ERICAH SCOTTO is a 62 y.o. year old female with a history of COPD, chronic respiratory failure on home oxygen, OSA, chronic systolic CHF (EF 16%, NYHA III), s/p ICD, s/p TAVR, hypothyroidism, who is referred for depression.    1. MDD (major depressive disorder), recurrent episode, mild 2. GAD (generalized anxiety disorder) Acute stressors include: Conflict with her son at home, feeling overcrowded in the house Other stressors include: Adoption at age 8 (both of her parents were abusing alcohol including the time she was born), unemployment (on disability), arthritis History: depression since age 51   There has been improvement in depressive symptoms since the last visit.  Will do further uptitration of sertraline to optimize treatment for depression and anxiety.  Will continue rexulti for depression given she reports significant benefit.  Discussed risks, includes but not limited to QTc prolongation, metabolic side effect, EPS.   3. Insomnia, unspecified type - uses CPAP  machine regularly Improving.  She cannot tolerate Ambien due to feeling nausea.  She reports good benefit from trazodone; will continue current dose at this time to target insomnia.    Plan Increase sertraline 150 mg daily  Continue rexulti 0.5 mg daily (QTc HR 80, 470 msec 06/2022,  Continue Trazodone 150 mg at night as needed for insomnia Referral for therapy  Next appointment: 6/11 at 11 am for 30 mins, video - on Ozempic   The patient  demonstrates the following risk factors for suicide: Chronic risk factors for suicide include: psychiatric disorder of depression . Acute risk factors for suicide include: family or marital conflict and unemployment. Protective factors for this patient include: positive social support. Considering these factors, the overall suicide risk at this point appears to be low. Patient is appropriate for outpatient follow up.     Collaboration of Care: Collaboration of Care: Other reviewed notes in Epic  Patient/Guardian was advised Release of Information must be obtained prior to any record release in order to collaborate their care with an outside provider. Patient/Guardian was advised if they have not already done so to contact the registration department to sign all necessary forms in order for [place] to release information regarding their care.   Consent: Patient/Guardian gives verbal consent for treatment and assignment of benefits for services provided during this visit. Patient/Guardian expressed understanding and agreed to proceed.    Neysa Hotter, MD 06/25/2022, 11:04 AM

## 2022-06-23 ENCOUNTER — Ambulatory Visit (INDEPENDENT_AMBULATORY_CARE_PROVIDER_SITE_OTHER): Payer: 59 | Admitting: Vascular Surgery

## 2022-06-23 ENCOUNTER — Ambulatory Visit (INDEPENDENT_AMBULATORY_CARE_PROVIDER_SITE_OTHER): Payer: 59

## 2022-06-23 VITALS — BP 135/76 | HR 73 | Resp 18 | Ht 61.0 in | Wt 285.2 lb

## 2022-06-23 DIAGNOSIS — I10 Essential (primary) hypertension: Secondary | ICD-10-CM

## 2022-06-23 DIAGNOSIS — I6523 Occlusion and stenosis of bilateral carotid arteries: Secondary | ICD-10-CM | POA: Diagnosis not present

## 2022-06-23 DIAGNOSIS — I890 Lymphedema, not elsewhere classified: Secondary | ICD-10-CM | POA: Diagnosis not present

## 2022-06-23 NOTE — Assessment & Plan Note (Signed)
Carotid duplex today shows velocities that would be just into the 40 to 59% range bilaterally.  Status post previous right carotid endarterectomy and doing well.  Would recommend aspirin and a statin agent going forward.  Recheck in 1 year.

## 2022-06-23 NOTE — Progress Notes (Signed)
MRN : [id number]  Kristie Dorsey is a 62 y.o. (29-May-1960) female who presents with chief complaint of  Chief Complaint  Patient presents with   Follow-up  .  History of Present Illness: Patient returns in follow-up of her carotid disease.  She is doing well today.  She is working on weight loss and has lost over 30 pounds from her last visit and is congratulated on this today.  This has helped her leg swelling and activity level.  She denies any focal neurologic symptoms. Specifically, the patient denies amaurosis fugax, speech or swallowing difficulties, or arm or leg weakness or numbness.  She is now 2 years status post right carotid endarterectomy. Carotid duplex today shows velocities that would be just into the 40 to 59% range bilaterally.   Current Outpatient Medications  Medication Sig Dispense Refill   albuterol (PROVENTIL HFA;VENTOLIN HFA) 108 (90 Base) MCG/ACT inhaler Inhale 2-4 puffs by mouth every 4 hours as needed for wheezing, cough, and/or shortness of breath (Patient taking differently: Inhale 2 puffs into the lungs every 4 (four) hours as needed for wheezing or shortness of breath. Inhale 2-4 puffs by mouth every 4 hours as needed for wheezing, cough, and/or shortness of breath) 1 Inhaler 1   atorvastatin (LIPITOR) 10 MG tablet TAKE 1 TABLET BY MOUTH ONCE DAILY. 90 tablet 2   carvedilol (COREG) 6.25 MG tablet Take 6.25 mg by mouth daily.     dapagliflozin propanediol (FARXIGA) 10 MG TABS tablet Take 10 mg by mouth daily.     ENTRESTO 97-103 MG Take 1 tablet by mouth 2 (two) times daily.     fluticasone (FLONASE) 50 MCG/ACT nasal spray Place into both nostrils.     furosemide (LASIX) 40 MG tablet Take 40 mg by mouth daily.      levothyroxine (SYNTHROID, LEVOTHROID) 75 MCG tablet Take 75 mcg by mouth daily before breakfast.      meloxicam (MOBIC) 15 MG tablet Take 15 mg by mouth daily.     mometasone-formoterol (DULERA) 200-5 MCG/ACT AERO Inhale 2 puffs into the lungs 2 (two)  times daily. 1 each 6   Multiple Vitamins-Minerals (MULTIVITAMIN WITH MINERALS) tablet Take 1 tablet by mouth daily.     OXYGEN Inhale 2-3 L/min into the lungs as needed (dyspnea, COPD related symptoms, obsesity related hypoventilation syndrome).     OZEMPIC, 0.25 OR 0.5 MG/DOSE, 2 MG/1.5ML SOPN Inject into the skin.     OZEMPIC, 2 MG/DOSE, 8 MG/3ML SOPN Inject into the skin.     progesterone (PROMETRIUM) 200 MG capsule Take 200 mg by mouth at bedtime.     REXULTI 0.25 MG TABS tablet Take 0.25 mg by mouth daily.     sertraline (ZOLOFT) 100 MG tablet Take 100 mg by mouth daily.     Spacer/Aero-Holding Chambers (AEROCHAMBER MV) inhaler Use as instructed 1 each 0   spironolactone (ALDACTONE) 25 MG tablet Take 25 mg by mouth daily.     Tiotropium Bromide Monohydrate (SPIRIVA RESPIMAT) 2.5 MCG/ACT AERS Inhale 2 puffs into the lungs daily. 4 g 6   traMADol (ULTRAM) 50 MG tablet Take 50 mg by mouth daily as needed.     traZODone (DESYREL) 50 MG tablet Take 150 mg by mouth at bedtime.     acetaminophen (TYLENOL) 325 MG tablet Take by mouth.     ferrous sulfate 325 (65 FE) MG tablet Take 325 mg by mouth daily with breakfast. (Patient not taking: Reported on 06/23/2022)     zolpidem (AMBIEN)  5 MG tablet Take 1 tablet (5 mg total) by mouth at bedtime as needed for sleep. 30 tablet 1   No current facility-administered medications for this visit.    Past Medical History:  Diagnosis Date   Abnormal uterine bleeding due to endometrial polyp    Anemia    Anginal pain (HCC)    Aortic atherosclerosis (HCC)    Arthritis    Cardiomyopathy (HCC)    a.) TTE 03/25/2018: EF 30-35%; b.) TTE 08/03/2019: EF 30%; c.) TTE 01/15/2020: EF 35%; d.) TTE 12/31/2020: EF 30%; e.) TTE 08/20/2021: EF 25%   Carotid stenosis    a.) s/p RIGHT CEA 06/20/2020   CHF (congestive heart failure) (HCC)    a.) TTE 03/25/18: EF 30-35%, diff HK, mild MR, sev AS (MPG 39), G1DD; b.) TTE 08/03/19: EF 30%, mild LVH, glob HK, mod LAE, mild  MR, triv TR; c.) TTE 01/15/20: EF 35%, mild LVH, basal-apical/antsep/infsep HK, mild LAE, triv TR/PR, mod MR, G1DD; d.) TTE 12/31/20: EF 30%, mod glob HK, mod LAE, triv TR/PR, mild MR, G1DD; e.) TTE 08/20/21: EF 25%, mod MVE/LAE, glob HK, septal AK, MAC, triv TR, mild MR   Complication of anesthesia    a.) delayed emergence following AVR   COPD (chronic obstructive pulmonary disease) (HCC)    Coronary artery disease    Depression    Dyspnea    Hypertension    Hypothyroidism    LBBB (left bundle branch block)    Morbid obesity (HCC)    MRSA nasal colonization 06/20/2020   Nonrheumatic aortic (valve) stenosis    a.) TTE 03/25/2018: EF 30-35, sev AS (MPG 39 mmHg); b.) 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; 23 mm Edwards SAPIEN 3   Obesity hypoventilation syndrome (HCC)    On supplemental oxygen therapy    a.) 2-3 L/[place] as needed   OSA treated with BiPAP    T2DM (type 2 diabetes mellitus) (HCC)     Past Surgical History:  Procedure Laterality Date   CHOLECYSTECTOMY     DILATATION & CURETTAGE/HYSTEROSCOPY WITH MYOSURE N/A 09/25/2021   Procedure: FRACTIONAL DILATATION & CURETTAGE/HYSTEROSCOPY;  Surgeon: Schermerhorn, Ihor Austin, MD;  Location: [facility];  Service: Gynecology;  Laterality: N/A;   ENDARTERECTOMY Right 06/20/2020   Procedure: ENDARTERECTOMY CAROTID;  Surgeon: Annice Needy, MD;  Location: [facility];  Service: Vascular;  Laterality: Right;   HYSTEROSCOPY WITH D & C N/A 02/28/2020   Procedure: DILATATION AND CURETTAGE;  Surgeon: Ward, Elenora Fender, MD;  Location: [facility];  Service: Gynecology;  Laterality: N/A;   LEFT HEART CATH AND CORONARY ANGIOGRAPHY N/A 04/14/2018   Procedure: LEFT HEART CATH AND CORONARY ANGIOGRAPHY;  Surgeon: Alwyn Pea, MD;  Location: [facility];  Service: Cardiovascular;  Laterality: N/A;   TONSILLECTOMY     TRANSCATHETER AORTIC VALVE REPLACEMENT, TRANSAORTIC Right 11/01/2018   Procedure: TRANSCATHETER AORTIC VALVE REPLACEMENT, TRANSAORTIC via a RIGHT FEMORAL  APPROACH; Location: [facility]; Surgeon: Elyn Peers, MD   TUBAL LIGATION       Social History   Tobacco Use   Smoking status: Former    Packs/day: 1.50    Years: 46.00    Additional pack years: 0.00    Total pack years: 69.00    Types: Cigarettes    Start date: 03/02/1973    Quit date: 11/01/2018    Years since quitting: 3.6   Smokeless tobacco: Never   Tobacco comments:    5 cigarettes daily--04/02/22 - trying to quit  Vaping Use   Vaping Use: Some days  Start date: 11/01/2018   Substances: Nicotine   Devices: trying to quit  Substance Use Topics   Alcohol use: No   Drug use: No      Family History  Adopted: Yes  Problem Relation Age of Onset   Alcohol abuse Mother    Aneurysm Mother    Alcohol abuse Father    Cancer Father    Depression Daughter      No Known Allergies  REVIEW OF SYSTEMS (Negative unless checked)   Constitutional: [x] Weight loss  [] Fever  [] Chills Cardiac: [] Chest pain   [] Chest pressure   [] Palpitations   [] Shortness of breath when laying flat   [] Shortness of breath at rest   [x] Shortness of breath with exertion. Vascular:  [] Pain in legs with walking   [] Pain in legs at rest   [] Pain in legs when laying flat   [] Claudication   [] Pain in feet when walking  [] Pain in feet at rest  [] Pain in feet when laying flat   [] History of DVT   [] Phlebitis   [x] Swelling in legs   [] Varicose veins   [] Non-healing ulcers Pulmonary:   [] Uses home oxygen   [] Productive cough   [] Hemoptysis   [] Wheeze  [x] COPD   [] Asthma Neurologic:  [] Dizziness  [] Blackouts   [] Seizures   [] History of stroke   [] History of TIA  [] Aphasia   [] Temporary blindness   [] Dysphagia   [] Weakness or numbness in arms   [] Weakness or numbness in legs Musculoskeletal:  [] Arthritis   [] Joint swelling   [] Joint pain   [] Low back pain Hematologic:  [] Easy bruising  [] Easy bleeding   [] Hypercoagulable state   [] Anemic  [] Hepatitis Gastrointestinal:  [] Blood in stool   [] Vomiting blood   [] Gastroesophageal reflux/heartburn   [] Difficulty swallowing. Genitourinary:  [] Chronic kidney disease   [] Difficult urination  [] Frequent urination  [] Burning with urination   [] Blood in urine Skin:  [] Rashes   [] Ulcers   [] Wounds Psychological:  [] History of anxiety   []  History of major depression.  Physical Examination  Vitals:   06/23/22 0929  BP: 135/76  Pulse: 73  Resp: 18  Weight: 285 lb 3.2 oz (129.4 kg)  Height: 5\' 1"  (1.549 m)   Body mass index is 53.89 kg/m. Gen:  WD/WN, NAD. Obese  Head: [place]/AT, No temporalis wasting. Ear/Nose/Throat: Hearing grossly intact, nares w/o erythema or drainage, trachea midline Eyes: Conjunctiva clear. Sclera non-icteric Neck: Supple.  No bruit  Pulmonary:  Good air movement, equal and clear to auscultation bilaterally.  Cardiac: RRR, No JVD Vascular:  Vessel Right Left  Radial Palpable Palpable               Musculoskeletal: M/S 5/5 throughout.  No deformity or atrophy. Trace LE edema. Neurologic: CN 2-12 intact. Sensation grossly intact in extremities.  Symmetrical.  Speech is fluent. Motor exam as listed above. Psychiatric: Judgment intact, Mood & affect appropriate for pt's clinical situation. Dermatologic: No rashes or ulcers noted.  No cellulitis or open wounds.    CBC Lab Results  Component Value Date   WBC 7.1 09/22/2021   HGB 11.4 (L) 09/22/2021   HCT 37.2 09/22/2021   MCV 86.9 09/22/2021   PLT 216 09/22/2021    BMET    Component Value Date/Time   NA 140 06/21/2020 0330   NA 136 02/27/2013 1054   K 4.4 06/21/2020 0330   K 4.2 02/27/2013 1054   CL 107 06/21/2020 0330   CL 105 02/27/2013 1054   CO2 25 06/21/2020 0330  CO2 32 02/27/2013 1054   GLUCOSE 160 (H) 06/21/2020 0330   GLUCOSE 100 (H) 02/27/2013 1054   BUN 12 06/21/2020 0330   BUN 11 02/27/2013 1054   CREATININE 1.06 (H) 06/21/2020 0330   CREATININE 0.72 02/27/2013 1054   CALCIUM 8.8 (L) 06/21/2020 0330   CALCIUM 9.1 02/27/2013 1054   GFRNONAA  >60 06/21/2020 0330   GFRNONAA >60 02/27/2013 1054   GFRAA >60 09/22/2019 1141   GFRAA >60 02/27/2013 1054   CrCl cannot be calculated (Patient's most recent lab result is older than the maximum 21 days allowed.).  COAG Lab Results  Component Value Date   INR 1.0 06/18/2020    Radiology No results found.   Assessment/Plan Carotid stenosis Carotid duplex today shows velocities that would be just into the 40 to 59% range bilaterally.  Status post previous right carotid endarterectomy and doing well.  Would recommend aspirin and a statin agent going forward.  Recheck in 1 year.  HTN (hypertension) blood pressure control important in reducing the progression of atherosclerotic disease. On appropriate oral medications.     Lymphedema Symptom control is reasonable.  Weight loss, elevation, and compression of benefit for reducing swelling.  Her weight loss is definitely helped her lower extremity symptoms.  Festus Barren, MD  06/23/2022 9:58 AM    This note was created with Dragon medical transcription system.  Any errors from dictation are purely unintentional

## 2022-06-25 ENCOUNTER — Encounter: Payer: Self-pay | Admitting: Psychiatry

## 2022-06-25 ENCOUNTER — Telehealth (INDEPENDENT_AMBULATORY_CARE_PROVIDER_SITE_OTHER): Payer: 59 | Admitting: Psychiatry

## 2022-06-25 DIAGNOSIS — F411 Generalized anxiety disorder: Secondary | ICD-10-CM

## 2022-06-25 DIAGNOSIS — F330 Major depressive disorder, recurrent, mild: Secondary | ICD-10-CM | POA: Diagnosis not present

## 2022-06-25 DIAGNOSIS — G4700 Insomnia, unspecified: Secondary | ICD-10-CM | POA: Diagnosis not present

## 2022-06-25 MED ORDER — REXULTI 0.5 MG PO TABS
0.5000 mg | ORAL_TABLET | Freq: Every day | ORAL | 1 refills | Status: AC
Start: 2022-06-25 — End: 2022-08-24

## 2022-06-25 NOTE — Patient Instructions (Signed)
Increase sertraline 150 mg daily  Continue rexulti 0.5 mg daily  Continue Trazodone 150 mg at night as needed for insomnia Referral for therapy  Next appointment: 6/11 at 11 am

## 2022-08-02 NOTE — Progress Notes (Deleted)
BH MD/PA/NP OP Progress Note  08/02/2022 3:18 PM Kristie Dorsey  MRN:  [id number]  Chief Complaint: No chief complaint on file.  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 Both of her biological parents abused alcohol. Her mother was using alcohol when she was born  Visit Diagnosis: No diagnosis found.  Past Psychiatric History: Please see initial evaluation for full details. I have reviewed the history. No updates at this time.     Past Medical History:  Past Medical History:  Diagnosis Date   Abnormal uterine bleeding due to endometrial polyp    Anemia    Anginal pain (HCC)    Aortic atherosclerosis (HCC)    Arthritis    Cardiomyopathy (HCC)    a.) TTE 03/25/2018: EF 30-35%; b.) TTE 08/03/2019: EF 30%; c.) TTE 01/15/2020: EF 35%; d.) TTE 12/31/2020: EF 30%; e.) TTE 08/20/2021: EF 25%   Carotid stenosis    a.) s/p RIGHT CEA 06/20/2020   CHF (congestive heart failure) (HCC)    a.) TTE 03/25/18: EF 30-35%, diff HK, mild MR, sev AS (MPG 39), G1DD; b.) TTE 08/03/19: EF 30%, mild LVH, glob HK, mod LAE, mild MR, triv TR; c.) TTE 01/15/20: EF 35%, mild LVH, basal-apical/antsep/infsep HK, mild LAE, triv TR/PR, mod MR, G1DD; d.) TTE 12/31/20: EF 30%, mod glob HK, mod LAE, triv TR/PR, mild MR, G1DD; e.) TTE 08/20/21: EF 25%, mod MVE/LAE, glob HK, septal AK, MAC, triv TR, mild MR   Complication of anesthesia    a.) delayed emergence following AVR   COPD (chronic obstructive pulmonary  disease) (HCC)    Coronary artery disease    Depression    Dyspnea    Hypertension    Hypothyroidism    LBBB (left bundle branch block)    Morbid obesity (HCC)    MRSA nasal colonization 06/20/2020   Nonrheumatic aortic (valve) stenosis    a.) TTE 03/25/2018: EF 30-35, sev AS (MPG 39 mmHg); b.) 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; 23 mm Edwards SAPIEN 3   Obesity hypoventilation syndrome (HCC)    On supplemental oxygen therapy    a.) 2-3 L/[place] as needed   OSA treated with BiPAP    T2DM (type 2 diabetes mellitus) (HCC)     Past Surgical History:  Procedure Laterality Date   CHOLECYSTECTOMY     DILATATION & CURETTAGE/HYSTEROSCOPY WITH MYOSURE N/A 09/25/2021   Procedure: FRACTIONAL DILATATION & CURETTAGE/HYSTEROSCOPY;  Surgeon: Schermerhorn, Ihor Austin, MD;  Location: [facility];  Service: Gynecology;  Laterality: N/A;   ENDARTERECTOMY Right 06/20/2020   Procedure: ENDARTERECTOMY CAROTID;  Surgeon: Annice Needy, MD;  Location: [facility];  Service: Vascular;  Laterality: Right;   HYSTEROSCOPY WITH D & C N/A 02/28/2020   Procedure: DILATATION AND CURETTAGE;  Surgeon: Ward, Elenora Fender, MD;  Location: [facility];  Service: Gynecology;  Laterality: N/A;   LEFT HEART CATH AND CORONARY ANGIOGRAPHY N/A 04/14/2018   Procedure: LEFT HEART CATH AND CORONARY ANGIOGRAPHY;  Surgeon: Alwyn Pea, MD;  Location:  [facility];  Service: Cardiovascular;  Laterality: N/A;   TONSILLECTOMY     TRANSCATHETER AORTIC VALVE REPLACEMENT, TRANSAORTIC Right 11/01/2018   Procedure: TRANSCATHETER AORTIC VALVE REPLACEMENT, TRANSAORTIC via a RIGHT FEMORAL APPROACH; Location: [facility]; Surgeon: Elyn Peers, MD   TUBAL LIGATION      Family Psychiatric History: Please see initial evaluation for full details. I have reviewed the history. No updates at this time.     Family History:  Family History  Adopted: Yes  Problem Relation Age of Onset   Alcohol abuse Mother    Aneurysm Mother    Alcohol abuse Father    Cancer Father     Depression Daughter     Social History:  Social History   Socioeconomic History   Marital status: Married    Spouse name: eugene   Number of children: 2   Years of education: Not on file   Highest education level: Not on file  Occupational History   Occupation: Designer, multimedia    Comment: disability  Tobacco Use   Smoking status: Former    Packs/day: 1.50    Years: 46.00    Additional pack years: 0.00    Total pack years: 69.00    Types: Cigarettes    Start date: 03/02/1973    Quit date: 11/01/2018    Years since quitting: 3.7   Smokeless tobacco: Never   Tobacco comments:    5 cigarettes daily--04/02/22 - trying to quit  Vaping Use   Vaping Use: Some days   Start date: 11/01/2018   Substances: Nicotine   Devices: trying to quit  Substance and Sexual Activity   Alcohol use: No   Drug use: No   Sexual activity: Not Currently  Other Topics Concern   Not on file  Social History Narrative   Patient lives with husband and 2 daughters. Has dogs.   Patient feels safe in her home.   She still vapes on a daily basis. Nicotine in her liquid.   Social Determinants of Health   Financial Resource Strain: Not on file  Food Insecurity: Not on file  Transportation Needs: Not on file  Physical Activity: Not on file  Stress: Not on file  Social Connections: Not on file    Allergies: No Known Allergies  Metabolic Disorder Labs: No results found for: "HGBA1C", "MPG" No results found for: "PROLACTIN" No results found for: "CHOL", "TRIG", "HDL", "CHOLHDL", "VLDL", "LDLCALC" No results found for: "TSH"  Therapeutic Level Labs: No results found for: "LITHIUM" No results found for: "VALPROATE" No results found for: "CBMZ"  Current Medications: Current Outpatient Medications  Medication Sig Dispense Refill   acetaminophen (TYLENOL) 325 MG tablet Take by mouth.     albuterol (PROVENTIL HFA;VENTOLIN HFA) 108 (90 Base) MCG/ACT inhaler Inhale 2-4 puffs by mouth every 4 hours as  needed for wheezing, cough, and/or shortness of breath (Patient taking differently: Inhale 2 puffs into the lungs every 4 (four) hours as needed for wheezing or shortness of breath. Inhale 2-4 puffs by mouth every 4 hours as needed for wheezing, cough, and/or shortness of breath) 1 Inhaler 1   atorvastatin (LIPITOR) 10 MG tablet TAKE 1 TABLET BY MOUTH ONCE DAILY. 90 tablet 2   Brexpiprazole (REXULTI) 0.5 MG TABS Take 1 tablet (0.5 mg total) by mouth daily. 30 tablet 1   carvedilol (COREG) 6.25 MG tablet Take 6.25 mg by mouth daily.     dapagliflozin propanediol (FARXIGA) 10 MG TABS tablet Take 10 mg by mouth daily.  ENTRESTO 97-103 MG Take 1 tablet by mouth 2 (two) times daily.     ferrous sulfate 325 (65 FE) MG tablet Take 325 mg by mouth daily with breakfast. (Patient not taking: Reported on 06/23/2022)     fluticasone (FLONASE) 50 MCG/ACT nasal spray Place into both nostrils.     furosemide (LASIX) 40 MG tablet Take 40 mg by mouth daily.      levothyroxine (SYNTHROID, LEVOTHROID) 75 MCG tablet Take 75 mcg by mouth daily before breakfast.      meloxicam (MOBIC) 15 MG tablet Take 15 mg by mouth daily.     mometasone-formoterol (DULERA) 200-5 MCG/ACT AERO Inhale 2 puffs into the lungs 2 (two) times daily. 1 each 6   Multiple Vitamins-Minerals (MULTIVITAMIN WITH MINERALS) tablet Take 1 tablet by mouth daily.     OXYGEN Inhale 2-3 L/min into the lungs as needed (dyspnea, COPD related symptoms, obsesity related hypoventilation syndrome).     OZEMPIC, 0.25 OR 0.5 MG/DOSE, 2 MG/1.5ML SOPN Inject into the skin.     OZEMPIC, 2 MG/DOSE, 8 MG/3ML SOPN Inject into the skin.     progesterone (PROMETRIUM) 200 MG capsule Take 200 mg by mouth at bedtime.     sertraline (ZOLOFT) 100 MG tablet Take 100 mg by mouth daily.     Spacer/Aero-Holding Chambers (AEROCHAMBER MV) inhaler Use as instructed 1 each 0   spironolactone (ALDACTONE) 25 MG tablet Take 25 mg by mouth daily.     Tiotropium Bromide Monohydrate  (SPIRIVA RESPIMAT) 2.5 MCG/ACT AERS Inhale 2 puffs into the lungs daily. 4 g 6   traMADol (ULTRAM) 50 MG tablet Take 50 mg by mouth daily as needed.     traZODone (DESYREL) 50 MG tablet Take 150 mg by mouth at bedtime.     No current facility-administered medications for this visit.     Musculoskeletal: Strength & Muscle Tone: {desc; muscle tone:32375} Gait & Station: {PE GAIT ED UJWJ:19147} Patient leans: {Patient Leans:21022755}  Psychiatric Specialty Exam: Review of Systems  Last menstrual period 02/21/2020.There is no height or weight on file to calculate BMI.  General Appearance: {Appearance:22683}  Eye Contact:  {BHH EYE CONTACT:22684}  Speech:  {Speech:22685}  Volume:  {Volume (PAA):22686}  Mood:  {BHH MOOD:22306}  Affect:  {Affect (PAA):22687}  Thought Process:  {Thought Process (PAA):22688}  Orientation:  {BHH ORIENTATION (PAA):22689}  Thought Content: {Thought Content:22690}   Suicidal Thoughts:  {ST/HT (PAA):22692}  Homicidal Thoughts:  {ST/HT (PAA):22692}  Memory:  {BHH MEMORY:22881}  Judgement:  {Judgement (PAA):22694}  Insight:  {Insight (PAA):22695}  Psychomotor Activity:  {Psychomotor (PAA):22696}  Concentration:  {Concentration:21399}  Recall:  {BHH GOOD/FAIR/POOR:22877}  Fund of Knowledge: {BHH GOOD/FAIR/POOR:22877}  Language: {BHH GOOD/FAIR/POOR:22877}  Akathisia:  {BHH YES OR NO:22294}  Handed:  {Handed:22697}  AIMS (if indicated): {Desc; done/not:10129}  Assets:  {Assets (PAA):22698}  ADL's:  {BHH WGN'F:62130}  Cognition: {chl bhh cognition:304700322}  Sleep:  {BHH GOOD/FAIR/POOR:22877}   Screenings: GAD-7    Flowsheet Row Office Visit from [facility]/02/2022 in [facility]  Total GAD-7 Score 4      PHQ2-9    Flowsheet Row Office Visit from 04/02/2022 in [facility]  PHQ-2 Total Score 2  PHQ-9 Total Score 9      Flowsheet Row Office Visit from 04/02/2022 in [facility] Admission (Discharged) from 09/25/2021 in [facility] Pre-Admission Testing 45 from 09/18/2021 in [facility] PRE [facility]MISSION TESTING  C-SSRS RISK CATEGORY No  Risk No Risk No Risk        Assessment and Plan:  FAREEHA OKULA is a 62 y.o. year old female with a history of COPD, chronic respiratory failure on home oxygen, OSA, chronic systolic CHF (EF 82%, NYHA III), s/p ICD, s/p TAVR, hypothyroidism, who is referred for depression.    1. MDD (major depressive disorder), recurrent episode, mild 2. GAD (generalized anxiety disorder) Acute stressors include: Conflict with her son at home, feeling overcrowded in the house Other stressors include: Adoption at age 36 (both of her parents were abusing alcohol including the time she was born), unemployment (on disability), arthritis History: depression since age 82   There has been improvement in depressive symptoms since the last visit.  Will do further uptitration of sertraline to optimize treatment for depression and anxiety.  Will continue rexulti for depression given she reports significant benefit.  Discussed risks, includes but not limited to QTc prolongation, metabolic side effect, EPS.    3. Insomnia, unspecified type - uses CPAP machine regularly Improving.  She cannot tolerate Ambien due to feeling nausea.  She reports good benefit from trazodone; will continue current dose at this time to target insomnia.    Plan Increase sertraline 150 mg daily  Continue rexulti 0.5 mg daily (QTc HR 80, 470 msec 06/2022,  Continue Trazodone 150 mg at night as needed for insomnia Referral for therapy  Next appointment: 6/11 at 11 am for 30 mins, video - on Ozempic   The patient demonstrates the following risk factors for suicide: Chronic risk factors for suicide include: psychiatric disorder of depression . Acute risk factors for suicide include: family or  marital conflict and unemployment. Protective factors for this patient include: positive social support. Considering these factors, the overall suicide risk at this point appears to be low. Patient is appropriate for outpatient follow up.     Collaboration of Care: Collaboration of Care: {BH OP Collaboration of Care:21014065}  Patient/Guardian was advised Release of Information must be obtained prior to any record release in order to collaborate their care with an outside provider. Patient/Guardian was advised if they have not already done so to contact the registration department to sign all necessary forms in order for [place] to release information regarding their care.   Consent: Patient/Guardian gives verbal consent for treatment and assignment of benefits for services provided during this visit. Patient/Guardian expressed understanding and agreed to proceed.    Neysa Hotter, MD 08/02/2022, 3:18 PM

## 2022-08-11 ENCOUNTER — Telehealth: Payer: Self-pay | Admitting: Psychiatry

## 2022-08-11 ENCOUNTER — Encounter: Payer: 59 | Admitting: Psychiatry

## 2022-08-11 ENCOUNTER — Telehealth: Payer: 59 | Admitting: Psychiatry

## 2022-08-11 NOTE — Progress Notes (Signed)
This encounter was created in error - please disregard.

## 2022-08-11 NOTE — Telephone Encounter (Signed)
Sent a video visit link through Epic, but the patient didn't sign in. Tried calling for today's appointment, but got no answer. Left a voicemail instructing the patient to contact the office at [phone].  

## 2022-08-13 ENCOUNTER — Ambulatory Visit: Payer: Medicare Other | Admitting: Student in an Organized Health Care Education/Training Program

## 2022-09-07 ENCOUNTER — Other Ambulatory Visit: Payer: Self-pay | Admitting: Psychiatry

## 2022-09-07 ENCOUNTER — Telehealth: Payer: Self-pay

## 2022-09-07 MED ORDER — REXULTI 0.5 MG PO TABS: 0.5000 mg | ORAL_TABLET | Freq: Every day | ORAL | 1 refills | Status: AC

## 2022-09-07 NOTE — Telephone Encounter (Signed)
left message that rx was sent to the pharmacy  

## 2022-09-07 NOTE — Telephone Encounter (Signed)
Ordered

## 2022-09-07 NOTE — Telephone Encounter (Signed)
received fax requesting a refill on the rexulti 0.5mg . pt last seen on 4-25 next appt 8-26

## 2022-10-19 NOTE — Progress Notes (Signed)
Virtual Visit via Video Note  I connected with Kristie Dorsey on 10/26/22 at  9:30 AM EDT by a video enabled telemedicine application and verified that I am speaking with the correct person using two identifiers.  Location: Patient: home Provider: office Persons participated in the visit- patient, provider    I discussed the limitations of evaluation and management by telemedicine and the availability of in person appointments. The patient expressed understanding and agreed to proceed.     I discussed the assessment and treatment plan with the patient. The patient was provided an opportunity to ask questions and all were answered. The patient agreed with the plan and demonstrated an understanding of the instructions.   The patient was advised to call back or seek an in-person evaluation if the symptoms worsen or if the condition fails to improve as anticipated.  I provided 15 minutes of non-face-to-face time during this encounter.   Neysa Hotter, MD    [facility] MD/PA/NP OP Progress Note  10/26/2022 9:56 AM Kristie Dorsey  MRN:  [id number]  Chief Complaint:  Chief Complaint  Patient presents with   Follow-up   HPI:  This is a follow-up appointment for depression, anxiety.  She is not being seen since April.  She states that she is not doing well.  She continues to be stressed due to her son.  He constantly talks, and is pessimistic.  He will talk anything randomly, and he knows it all.  She was advised by her daughters not to pay attention to it.  She reports good relationship with her daughter at home.  She helps her grandchildren.  Although she loves him so much, they are all over everything.  She feels that this is her time as she has already raised her kids.  She is unable to do things due to knee pain.  She feels drained.  She is anxious, and feels like her mind keeps going.  She sleeps well overall, although she does not feel refreshed at times.  She denies SI.  She does not recall  about uptitration of sertraline, although she is willing to do this this time. She denies SI.   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 Both of her biological parents abused alcohol. Her mother was using alcohol when she was born  Visit Diagnosis:    ICD-10-CM   1. MDD (major depressive disorder), recurrent episode, mild (HCC)  F33.0     2. GAD (generalized anxiety disorder)  F41.1     3. Insomnia, unspecified type  G47.00       Past Psychiatric History: Please see initial evaluation for full details. I have reviewed the history. No updates at this time.     Past Medical History:  Past Medical History:  Diagnosis Date   Abnormal uterine  bleeding due to endometrial polyp    Anemia    Anginal pain (HCC)    Aortic atherosclerosis (HCC)    Arthritis    Cardiomyopathy (HCC)    a.) TTE 03/25/2018: EF 30-35%; b.) TTE 08/03/2019: EF 30%; c.) TTE 01/15/2020: EF 35%; d.) TTE 12/31/2020: EF 30%; e.) TTE 08/20/2021: EF 25%   Carotid stenosis    a.) s/p RIGHT CEA 06/20/2020   CHF (congestive heart failure) (HCC)    a.) TTE 03/25/18: EF 30-35%, diff HK, mild MR, sev AS (MPG 39), G1DD; b.) TTE 08/03/19: EF 30%, mild LVH, glob HK, mod LAE, mild MR, triv TR; c.) TTE 01/15/20: EF 35%, mild LVH, basal-apical/antsep/infsep HK, mild LAE, triv TR/PR, mod MR, G1DD;  d.) TTE 12/31/20: EF 30%, mod glob HK, mod LAE, triv TR/PR, mild MR, G1DD; e.) TTE 08/20/21: EF 25%, mod MVE/LAE, glob HK, septal AK, MAC, triv TR, mild MR   Complication of anesthesia    a.) delayed emergence following AVR   COPD (chronic obstructive pulmonary disease) (HCC)    Coronary artery disease    Depression    Dyspnea    Hypertension    Hypothyroidism    LBBB (left bundle branch block)    Morbid obesity (HCC)    MRSA nasal colonization 06/20/2020   Nonrheumatic aortic (valve) stenosis    a.) TTE 03/25/2018: EF 30-35, sev AS (MPG 39 mmHg); b.) 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; 23 mm Edwards SAPIEN 3   Obesity hypoventilation syndrome (HCC)    On supplemental oxygen therapy    a.) 2-3 L/[place] as needed   OSA treated with BiPAP    T2DM (type 2 diabetes mellitus) (HCC)     Past Surgical History:  Procedure Laterality Date   CHOLECYSTECTOMY     DILATATION & CURETTAGE/HYSTEROSCOPY WITH MYOSURE N/A 09/25/2021   Procedure: FRACTIONAL DILATATION & CURETTAGE/HYSTEROSCOPY;  Surgeon: Schermerhorn, Ihor Austin, MD;  Location: [facility];  Service: Gynecology;  Laterality: N/A;   ENDARTERECTOMY Right 06/20/2020   Procedure: ENDARTERECTOMY CAROTID;  Surgeon: Annice Needy, MD;  Location: [facility];  Service: Vascular;  Laterality: Right;   HYSTEROSCOPY WITH D & C N/A 02/28/2020   Procedure: DILATATION AND CURETTAGE;  Surgeon: Ward, Elenora Fender, MD;  Location: [facility];  Service: Gynecology;  Laterality: N/A;   LEFT HEART CATH AND CORONARY ANGIOGRAPHY N/A 04/14/2018   Procedure: LEFT HEART CATH AND CORONARY ANGIOGRAPHY;  Surgeon: Alwyn Pea, MD;  Location: [facility];  Service: Cardiovascular;  Laterality: N/A;   TONSILLECTOMY     TRANSCATHETER AORTIC VALVE REPLACEMENT, TRANSAORTIC Right 11/01/2018   Procedure: TRANSCATHETER AORTIC VALVE REPLACEMENT, TRANSAORTIC via a RIGHT FEMORAL APPROACH; Location: [facility]; Surgeon: Elyn Peers, MD   TUBAL LIGATION      Family Psychiatric History: Please see  initial evaluation for full details. I have reviewed the history. No updates at this time.    Family History:  Family History  Adopted: Yes  Problem Relation Age of Onset   Alcohol abuse Mother    Aneurysm Mother    Alcohol abuse Father    Cancer Father    Depression Daughter     Social History:  Social History   Socioeconomic History   Marital status: Married    Spouse name: eugene   Number of children: 2   Years of education: Not on file   Highest education level: Not on file  Occupational History   Occupation: Designer, multimedia    Comment: disability  Tobacco Use   Smoking status: Former  Current packs/day: 0.00    Average packs/day: 1.5 packs/day for 46.0 years (69.0 ttl pk-yrs)    Types: Cigarettes    Start date: 03/02/1973    Quit date: 11/01/2018    Years since quitting: 3.9   Smokeless tobacco: Never   Tobacco comments:    5 cigarettes daily--04/02/22 - trying to quit  Vaping Use   Vaping status: Some Days   Start date: 11/01/2018   Substances: Nicotine   Devices: trying to quit  Substance and Sexual Activity   Alcohol use: No   Drug use: No   Sexual activity: Not Currently  Other Topics Concern   Not on file  Social History Narrative   Patient lives with husband and 2 daughters. Has dogs.   Patient feels safe in her home.   She still vapes on a daily basis. Nicotine in her liquid.   Social Determinants of Health   Financial Resource Strain: Not on file  Food Insecurity: Food Insecurity Present (11/02/2018)   [facility]   Hunger Vital Sign    Worried About Running Out of Food in the Last Year: Sometimes true    Ran Out of Food in the Last Year: Sometimes true  Transportation Needs: Not on file  Physical Activity: Not on file  Stress: Not on file  Social Connections: Not on file    Allergies: No Known Allergies  Metabolic Disorder Labs: No results found for: "HGBA1C", "MPG" No results found for: "PROLACTIN" No  results found for: "CHOL", "TRIG", "HDL", "CHOLHDL", "VLDL", "LDLCALC" No results found for: "TSH"  Therapeutic Level Labs: No results found for: "LITHIUM" No results found for: "VALPROATE" No results found for: "CBMZ"  Current Medications: Current Outpatient Medications  Medication Sig Dispense Refill   acetaminophen (TYLENOL) 325 MG tablet Take by mouth.     albuterol (PROVENTIL HFA;VENTOLIN HFA) 108 (90 Base) MCG/ACT inhaler Inhale 2-4 puffs by mouth every 4 hours as needed for wheezing, cough, and/or shortness of breath (Patient taking differently: Inhale 2 puffs into the lungs every 4 (four) hours as needed for wheezing or shortness of breath. Inhale 2-4 puffs by mouth every 4 hours as needed for wheezing, cough, and/or shortness of breath) 1 Inhaler 1   atorvastatin (LIPITOR) 10 MG tablet TAKE 1 TABLET BY MOUTH ONCE DAILY. 90 tablet 2   Brexpiprazole (REXULTI) 0.5 MG TABS Take 1 tablet (0.5 mg total) by mouth daily. 30 tablet 1   carvedilol (COREG) 6.25 MG tablet Take 6.25 mg by mouth daily.     dapagliflozin propanediol (FARXIGA) 10 MG TABS tablet Take 10 mg by mouth daily.     ENTRESTO 97-103 MG Take 1 tablet by mouth 2 (two) times daily.     ferrous sulfate 325 (65 FE) MG tablet Take 325 mg by mouth daily with breakfast. (Patient not taking: Reported on 06/23/2022)     fluticasone (FLONASE) 50 MCG/ACT nasal spray Place into both nostrils.     furosemide (LASIX) 40 MG tablet Take 40 mg by mouth daily.      levothyroxine (SYNTHROID, LEVOTHROID) 75 MCG tablet Take 75 mcg by mouth daily before breakfast.      meloxicam (MOBIC) 15 MG tablet Take 15 mg by mouth daily.     mometasone-formoterol (DULERA) 200-5 MCG/ACT AERO Inhale 2 puffs into the lungs 2 (two) times daily. 1 each 6   Multiple Vitamins-Minerals (MULTIVITAMIN WITH MINERALS) tablet Take 1 tablet by mouth daily.     OXYGEN Inhale 2-3 L/min  into the lungs as needed (dyspnea, COPD related symptoms, obsesity related hypoventilation  syndrome).     OZEMPIC, 0.25 OR 0.5 MG/DOSE, 2 MG/1.5ML SOPN Inject into the skin.     OZEMPIC, 2 MG/DOSE, 8 MG/3ML SOPN Inject into the skin.     progesterone (PROMETRIUM) 200 MG capsule Take 200 mg by mouth at bedtime.     sertraline (ZOLOFT) 100 MG tablet Take 100 mg by mouth daily.     Spacer/Aero-Holding Chambers (AEROCHAMBER MV) inhaler Use as instructed 1 each 0   spironolactone (ALDACTONE) 25 MG tablet Take 25 mg by mouth daily.     Tiotropium Bromide Monohydrate (SPIRIVA RESPIMAT) 2.5 MCG/ACT AERS Inhale 2 puffs into the lungs daily. 4 g 6   traMADol (ULTRAM) 50 MG tablet Take 50 mg by mouth daily as needed.     traZODone (DESYREL) 50 MG tablet Take 150 mg by mouth at bedtime.     No current facility-administered medications for this visit.     Musculoskeletal: Strength & Muscle Tone:  N/A Gait & Station:  N/A Patient leans: N/A  Psychiatric Specialty Exam: Review of Systems  Psychiatric/Behavioral:  Positive for dysphoric mood and sleep disturbance. Negative for agitation, behavioral problems, confusion, decreased concentration, hallucinations, self-injury and suicidal ideas. The patient is nervous/anxious. The patient is not hyperactive.   All other systems reviewed and are negative.   Last menstrual period 02/21/2020.There is no height or weight on file to calculate BMI.  General Appearance: Fairly Groomed  Eye Contact:  Good  Speech:  Clear and Coherent  Volume:  Normal  Mood:  Depressed  Affect:  Appropriate, Congruent, and calm  Thought Process:  Coherent  Orientation:  Full (Time, Place, and Person)  Thought Content: Logical   Suicidal Thoughts:  No  Homicidal Thoughts:  No  Memory:  Immediate;   Good  Judgement:  Good  Insight:  Good  Psychomotor Activity:  Normal  Concentration:  Concentration: Good and Attention Span: Good  Recall:  Good  Fund of Knowledge: Good  Language: Good  Akathisia:  No  Handed:  Right  AIMS (if indicated): not done  Assets:   Communication Skills Desire for Improvement  ADL's:  Intact  Cognition: WNL  Sleep:  Fair   Screenings: GAD-7    Flowsheet Row Office Visit from [facility]/02/2022 in [facility]  Total GAD-7 Score 4      PHQ2-9    Flowsheet Row Office Visit from 04/02/2022 in [facility]  PHQ-2 Total Score 2  PHQ-9 Total Score 9      Flowsheet Row Office Visit from 04/02/2022 in [facility] Admission (Discharged) from 09/25/2021 in [facility] Pre-Admission Testing 44 from 09/18/2021 in [facility] PRE ADMISSION TESTING  C-SSRS RISK CATEGORY No Risk No Risk No Risk        Assessment and Plan:  Kristie Dorsey is a 62 y.o. year old female with a history of COPD, chronic respiratory failure on home oxygen, OSA, chronic systolic CHF (EF 21%, NYHA III), s/p ICD, s/p TAVR, hypothyroidism, who is referred for depression.   1. MDD (major depressive disorder), recurrent episode, mild (HCC) 2. GAD (generalized anxiety disorder) Acute stressors include: Conflict with her son at home, feeling overcrowded in the house, knee pain Other stressors include: Adoption at age 59 (both of her parents were abusing alcohol including the time she was born), unemployment (on disability), arthritis History:  depression since age 53   There has been worsening in depressive symptoms since the last visit in the context of stressors as above.  She has not up titrated sertraline; she agrees to pursue this to optimize treatment for depression and anxiety.  Will continue rexulti for depression given she reports significant benefit from this medication. Discussed risks, includes but not limited to QTc prolongation, metabolic side effect, EPS.     3. Insomnia, unspecified type - uses CPAP machine regularly  Overall stable.  Will continue trazodone as needed for  insomnia.    Plan Increase sertraline 150 mg daily - she declined a refill Continue rexulti 0.5 mg daily (QTc HR 80, 470 msec 06/2022) Continue Trazodone 150 mg at night as needed for insomnia Referral for therapy  Next appointment: 10/14 at 11:30, IP - on Ozempic   The patient demonstrates the following risk factors for suicide: Chronic risk factors for suicide include: psychiatric disorder of depression . Acute risk factors for suicide include: family or marital conflict and unemployment. Protective factors for this patient include: positive social support. Considering these factors, the overall suicide risk at this point appears to be low. Patient is appropriate for outpatient follow up.   Collaboration of Care: Collaboration of Care: Other reviewed notes in Epic  Patient/Guardian was advised Release of Information must be obtained prior to any record release in order to collaborate their care with an outside provider. Patient/Guardian was advised if they have not already done so to contact the registration department to sign all necessary forms in order for [place] to release information regarding their care.   Consent: Patient/Guardian gives verbal consent for treatment and assignment of benefits for services provided during this visit. Patient/Guardian expressed understanding and agreed to proceed.    Neysa Hotter, MD 10/26/2022, 9:56 AM

## 2022-10-26 ENCOUNTER — Telehealth (INDEPENDENT_AMBULATORY_CARE_PROVIDER_SITE_OTHER): Payer: 59 | Admitting: Psychiatry

## 2022-10-26 ENCOUNTER — Encounter: Payer: Self-pay | Admitting: Psychiatry

## 2022-10-26 DIAGNOSIS — F330 Major depressive disorder, recurrent, mild: Secondary | ICD-10-CM | POA: Diagnosis not present

## 2022-10-26 DIAGNOSIS — F411 Generalized anxiety disorder: Secondary | ICD-10-CM | POA: Diagnosis not present

## 2022-10-26 DIAGNOSIS — G4700 Insomnia, unspecified: Secondary | ICD-10-CM | POA: Diagnosis not present

## 2022-10-26 MED ORDER — TRAZODONE HCL 50 MG PO TABS: 150.0000 mg | ORAL_TABLET | Freq: Every day | ORAL | 1 refills | Status: DC

## 2022-10-26 NOTE — Patient Instructions (Signed)
Increase sertraline 150 mg daily Continue rexulti 0.5 mg daily Continue Trazodone 150 mg at night as needed for insomnia Referral for therapy  Next appointment: 10/14 at 11:30

## 2022-11-10 ENCOUNTER — Ambulatory Visit (INDEPENDENT_AMBULATORY_CARE_PROVIDER_SITE_OTHER): Payer: 59 | Admitting: Psychology

## 2022-11-10 ENCOUNTER — Encounter: Payer: Self-pay | Admitting: Psychology

## 2022-11-10 DIAGNOSIS — F331 Major depressive disorder, recurrent, moderate: Secondary | ICD-10-CM | POA: Diagnosis not present

## 2022-11-10 DIAGNOSIS — F419 Anxiety disorder, unspecified: Secondary | ICD-10-CM
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
## 2022-11-16 ENCOUNTER — Other Ambulatory Visit: Payer: Self-pay

## 2022-11-16 ENCOUNTER — Other Ambulatory Visit: Payer: Self-pay | Admitting: Psychiatry

## 2022-11-16 MED ORDER — REXULTI 0.5 MG PO TABS
0.5000 mg | ORAL_TABLET | Freq: Every day | ORAL | 1 refills | Status: AC
Start: 2022-11-16 — End: 2023-01-15

## 2022-11-16 NOTE — Telephone Encounter (Signed)
I've ordered Rexulti as requested and noticed that her appointment, originally scheduled for 10/14, was not set up on our end. Please contact her to arrange a 30-minute in-person follow-up appointment and place her on the waitlist for October. Let me know if she needs a refill.

## 2022-11-17 NOTE — Telephone Encounter (Signed)
Patient scheduled 01/04/23. She is okay with not being on a wait list.

## 2022-11-18 ENCOUNTER — Ambulatory Visit (INDEPENDENT_AMBULATORY_CARE_PROVIDER_SITE_OTHER): Payer: 59 | Admitting: Psychology

## 2022-11-18 DIAGNOSIS — F419 Anxiety disorder, unspecified: Secondary | ICD-10-CM

## 2022-11-18 DIAGNOSIS — F331 Major depressive disorder, recurrent, moderate: Secondary | ICD-10-CM | POA: Diagnosis not present

## 2022-11-18 NOTE — Progress Notes (Signed)
                Forde Radon, [facility]
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
## 2022-11-26 ENCOUNTER — Ambulatory Visit: Payer: 59 | Admitting: Psychology

## 2022-11-26 DIAGNOSIS — F331 Major depressive disorder, recurrent, moderate: Secondary | ICD-10-CM | POA: Diagnosis not present

## 2022-11-26 DIAGNOSIS — F419 Anxiety disorder, unspecified: Secondary | ICD-10-CM | POA: Diagnosis not present

## 2022-11-26 NOTE — Progress Notes (Signed)
[facility] Counselor/Therapist Progress Note  Patient ID: Kristie Dorsey, MRN: [id number],    Date: 11/26/2022  Time Spent: 11:56am-12:46pm   Treatment Type: Individual Therapy  pt is seen for a virtual video visit via caregility.  Pt joins from her home, reporting privacy, and counselor from her office.  Pt consents to virtual visit and is aware of limitations of virtual visits.   Reported Symptoms: continued depressed mood, low self worth, fatigue, loss of interest and withdrawing from others  Mental Status Exam: Appearance:  Well Groomed     Behavior: Appropriate  Motor: Normal  Speech/Language:  Clear and Coherent  Affect: Depressed  Mood: depressed  Thought process: normal  Thought content:   WNL  Sensory/Perceptual disturbances:   WNL  Orientation: oriented to person, place, time/date, and situation  Attention: Good  Concentration: Good  Memory: WNL  Fund of knowledge:  Good  Insight:   Good  Judgment:  Good  Impulse Control: Good   Risk Assessment: Danger to Self:  No Self-injurious Behavior: No Danger to Others: No Duty to Warn:no Physical Aggression / Violence:No  Access to Firearms a concern: No  Gang Involvement:No   Subjective: Counselor assessed pt current functioning per pt report.  Processed w/ pt depressed mood and related stressors and thoughts.  Explored depressive thinking and negative self talk.  Assisted pt in recognizing and reframing.  Discussed need ask for help and to say no.   Pt affect congruent w/ report of depressed mood.  Pt reports did reach out to psychiatrist and encouraged to try the medication longer.  Pt agreed.  Pt reports she is not feeling any better and now feels like doing nothing and just wants others to leave her alone. Pt reported that she struggles w/ feeling bad about self that not doing more and guilty when telling her daughter not she can't watch her 1y/o grandchild.  Pt acknowledges she puts pressure on self that  has to do all these things or not good enough.  Pt is able to acknowledge that she does have support from others that acknowledge she is taking on too much.  Pt is able to acknowledged depressive thinking and refame that not a bad person because she can't do things. Pt aware that lack current relationship stressor also play a role her self worth/depression.   Pt agrees to practice challenging negative self worth statements, agree to help that is offered and practice saying no.    Interventions: Cognitive Behavioral Therapy and supportive  Diagnosis:Major depressive disorder, recurrent episode, moderate (HCC)  Anxiety disorder, unspecified type  Plan: Pt to f/u w/ biweekly counseling. Pt to f/u w/ Psychiatrist for medication management.  Pt to f/u w/ PCP as scheduled.    Individualized Treatment Plan Strengths: seeking care and positive relationship w/ PCP  Supports: husband and kids    Goal/Needs for Treatment:  In order of importance to patient 1) reduce depression 2) improve coping w/ pain 3) ---    Client Statement of Needs: "I want to work on being happy.  Not being so sad and feeling so lonely.  I struggle with the pain and that I can't do anything about it"      Treatment Level:outpatient counseling  Symptoms:depressed mood, low energy, loss of interest, hopeless and overwhelmed.   Client Treatment Preferences:Pt want to start w/ counseling every week.  "  There's a lot I need to talk about. "  Pt to continue w/ Dr. Vanetta Shawl for medication management.  Healthcare consumer's goal for treatment:   Counselor, Forde Radon, [facility] will support the patient's ability to achieve the goals identified. Cognitive Behavioral Therapy, Assertive Communication/Conflict Resolution Training, Relaxation Training, ACT, Humanistic and other evidenced-based practices will be used to promote progress towards healthy functioning.    Healthcare consumer will: Actively participate in therapy, working  towards healthy functioning.     *Justification for Continuation/Discontinuation of Goal: R=Revised, O=Ongoing, A=Achieved, D=Discontinued   Goal 1) Decreased depressive episodes by utilizing coping skills per pt report, counselor observation and PHQ9 score.   Baseline date 11/10/22 : Progress towards goal 0; How Often - Daily Target Date Goal Was reviewed Status Code Progress towards goal/Likert rating  11/10/23                            Goal 2) learn and utilize relaxation and mindfulness skills to assist coping w/ pain per pt report and counselor observation.   Baseline date 11/10/22: Progress towards goal 0; How Often - Daily Target Date Goal Was reviewed Status Code Progress towards goal  11/10/23                            This plan has been reviewed and created by the following participants:  This plan will be reviewed at least every 12 months. Date Behavioral Health Clinician Date Guardian/Patient   11/09/12            [facility] [name] [facility] 11/10/22 Verbal Consent Provided                                 Forde Radon [facility]

## 2022-12-02 ENCOUNTER — Ambulatory Visit: Payer: 59 | Admitting: Psychology

## 2022-12-02 DIAGNOSIS — F419 Anxiety disorder, unspecified: Secondary | ICD-10-CM

## 2022-12-02 DIAGNOSIS — F331 Major depressive disorder, recurrent, moderate: Secondary | ICD-10-CM
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
## 2022-12-09 ENCOUNTER — Telehealth: Payer: Self-pay | Admitting: Pulmonary Disease

## 2022-12-09 MED ORDER — SPIRIVA RESPIMAT 2.5 MCG/ACT IN AERS: 2.0000 | INHALATION_SPRAY | Freq: Every day | RESPIRATORY_TRACT | 0 refills | Status: DC

## 2022-12-09 MED ORDER — DULERA 200-5 MCG/ACT IN AERO: 2.0000 | INHALATION_SPRAY | Freq: Two times a day (BID) | RESPIRATORY_TRACT | 0 refills | Status: DC

## 2022-12-09 NOTE — Telephone Encounter (Signed)
One month supply of spiriva and dulera has been sent to preferred pharmacy.  Patient is aware and voiced her understanding.  She will keep scheduled visit for 10/21. Nothing further needed.

## 2022-12-09 NOTE — Telephone Encounter (Signed)
Pt states [facility] has requested refill on her spiriva.

## 2022-12-15 ENCOUNTER — Other Ambulatory Visit: Payer: Self-pay | Admitting: Acute Care

## 2022-12-15 DIAGNOSIS — Z122 Encounter for screening for malignant neoplasm of respiratory organs: Secondary | ICD-10-CM

## 2022-12-15 DIAGNOSIS — Z87891 Personal history of nicotine dependence: Secondary | ICD-10-CM

## 2022-12-15 DIAGNOSIS — F17210 Nicotine dependence, cigarettes, uncomplicated: Secondary | ICD-10-CM

## 2022-12-16 ENCOUNTER — Ambulatory Visit (INDEPENDENT_AMBULATORY_CARE_PROVIDER_SITE_OTHER): Payer: 59 | Admitting: Psychology

## 2022-12-16 DIAGNOSIS — F419 Anxiety disorder, unspecified: Secondary | ICD-10-CM

## 2022-12-16 DIAGNOSIS — F331 Major depressive disorder, recurrent, moderate: Secondary | ICD-10-CM
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
## 2022-12-21 ENCOUNTER — Ambulatory Visit: Payer: 59 | Admitting: Pulmonary Disease

## 2022-12-30 ENCOUNTER — Ambulatory Visit: Payer: 59 | Admitting: Psychology

## 2023-01-01 NOTE — Progress Notes (Unsigned)
BH MD/PA/NP OP Progress Note  01/05/2023 1:36 PM Kristie Dorsey  MRN:  [id number]  Chief Complaint:  Chief Complaint  Patient presents with   Follow-up   HPI:  This is a follow-up appointment for depression, anxiety and insomnia.  She states that she is not doing very well.  She struggles with middle insomnia lately.  Although she has knee pain, she does not think it is necessary affecting her insomnia.  She believes that things are going fine with her children.  She reports limited communication with her husband, who tends to watch phone when they are together.  Although she loves reading and taking, these are not enjoyable as much compared to before.  She denies any seasonal pattern.  She tends to feel anxious due to random thoughts through her mind.  She agrees to discuss this with her therapist to see if she can be offered therapy for this (CBT-i).  She denies change in appetite.  She denies SI.  She has occasional panic attacks.  She thinks paroxetine used to work better, although she does not know why it was discontinued.  She agrees to try venlafaxine instead to mitigate potential drug interaction.    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 Both of her biological parents abused alcohol. Her mother was using alcohol when she was born  Visit Diagnosis:    ICD-10-CM   1. Major depressive disorder, recurrent episode, moderate (HCC)  F33.1     2. Anxiety disorder,  unspecified type  F41.9     3. Insomnia, unspecified type  G47.00       Past Psychiatric History: Please see initial evaluation for full details. I have reviewed the history. No updates at this time.     Past Medical History:  Past Medical History:  Diagnosis Date   Abnormal uterine bleeding due to endometrial polyp    Anemia    Anginal pain (HCC)    Aortic atherosclerosis (HCC)    Arthritis    Cardiomyopathy (HCC)    a.) TTE 03/25/2018: EF 30-35%; b.) TTE 08/03/2019: EF 30%; c.) TTE 01/15/2020: EF 35%; d.) TTE 12/31/2020: EF 30%; e.) TTE 08/20/2021: EF 25%   Carotid stenosis    a.) s/p RIGHT CEA 06/20/2020   CHF (congestive heart failure) (HCC)    a.) TTE 03/25/18: EF 30-35%, diff HK, mild MR, sev AS (MPG 39), G1DD; b.) TTE 08/03/19: EF 30%, mild LVH, glob HK, mod LAE, mild MR, triv TR; c.) TTE 01/15/20: EF 35%, mild LVH, basal-apical/antsep/infsep HK, mild LAE, triv TR/PR, mod MR, G1DD; d.) TTE 12/31/20: EF 30%, mod glob HK, mod LAE, triv TR/PR, mild MR, G1DD; e.) TTE 08/20/21: EF 25%, mod MVE/LAE, glob HK, septal AK, MAC, triv TR, mild MR   Complication of anesthesia    a.) delayed emergence following AVR   COPD (chronic obstructive pulmonary disease) (HCC)    Coronary artery disease  Depression    Dyspnea    Hypertension    Hypothyroidism    LBBB (left bundle branch block)    Morbid obesity (HCC)    MRSA nasal colonization 06/20/2020   Nonrheumatic aortic (valve) stenosis    a.) TTE 03/25/2018: EF 30-35, sev AS (MPG 39 mmHg); b.) 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; 23 mm Edwards SAPIEN 3   Obesity hypoventilation syndrome (HCC)    On supplemental oxygen therapy    a.) 2-3 L/[place] as needed   OSA treated with BiPAP    T2DM (type 2 diabetes mellitus) (HCC)     Past Surgical History:  Procedure Laterality Date   CHOLECYSTECTOMY     DILATATION & CURETTAGE/HYSTEROSCOPY WITH MYOSURE N/A 09/25/2021   Procedure: FRACTIONAL DILATATION & CURETTAGE/HYSTEROSCOPY;  Surgeon: Schermerhorn, Ihor Austin, MD;  Location: [facility];  Service: Gynecology;  Laterality: N/A;   ENDARTERECTOMY Right 06/20/2020   Procedure: ENDARTERECTOMY CAROTID;  Surgeon: Annice Needy, MD;  Location: [facility];  Service: Vascular;  Laterality: Right;   HYSTEROSCOPY WITH D & C N/A 02/28/2020   Procedure: DILATATION AND CURETTAGE;  Surgeon: Ward, Elenora Fender, MD;  Location: [facility];  Service: Gynecology;  Laterality: N/A;   LEFT HEART CATH AND CORONARY ANGIOGRAPHY N/A 04/14/2018   Procedure: LEFT HEART CATH AND CORONARY ANGIOGRAPHY;  Surgeon: Alwyn Pea, MD;  Location: [facility];  Service: Cardiovascular;  Laterality: N/A;   TONSILLECTOMY     TRANSCATHETER AORTIC VALVE REPLACEMENT, TRANSAORTIC Right 11/01/2018   Procedure: TRANSCATHETER AORTIC VALVE REPLACEMENT, TRANSAORTIC via a RIGHT FEMORAL APPROACH; Location: [facility]; Surgeon: Elyn Peers, MD   TUBAL LIGATION      Family Psychiatric History: Please see initial evaluation for full details. I have reviewed the history. No updates at this time.     Family History:  Family History  Adopted: Yes  Problem Relation Age of Onset   Alcohol abuse Mother    Aneurysm Mother    Alcohol abuse Father    Cancer Father    Depression Daughter    ADD / ADHD Son    Bipolar disorder Son     Social History:  Social History   Socioeconomic History   Marital status: Married    Spouse name: eugene   Number of children: 2   Years of education: Not on file   Highest education level: Not on file  Occupational History   Occupation: Designer, multimedia    Comment: disability  Tobacco Use   Smoking status: Former    Current packs/day: 0.00    Average packs/day: 1.5 packs/day for 46.0 years (69.0 ttl pk-yrs)    Types: Cigarettes    Start date: 03/02/1973    Quit date: 11/01/2018    Years since quitting: 4.1   Smokeless tobacco: Never   Tobacco comments:    5 cigarettes daily--04/02/22 - trying to quit  Vaping Use   Vaping status: Some Days   Start date: 11/01/2018    Substances: Nicotine   Devices: trying to quit  Substance and Sexual Activity   Alcohol use: No   Drug use: No   Sexual activity: Not Currently  Other Topics Concern   Not on file  Social History Narrative   Patient lives with husband and 2 daughters. Has dogs.   Patient feels safe in her home.   She still vapes on a daily basis. Nicotine in her liquid.   Social Determinants of Health   Financial Resource Strain: Not on file  Food Insecurity: Food Insecurity Present (11/02/2018)  [facility]   Hunger Vital Sign    Worried About Running Out of Food in the Last Year: Sometimes true    Ran Out of Food in the Last Year: Sometimes true  Transportation Needs: Not on file  Physical Activity: Not on file  Stress: Not on file  Social Connections: Not on file    Allergies: No Known Allergies  Metabolic Disorder Labs: No results found for: "HGBA1C", "MPG" No results found for: "PROLACTIN" No results found for: "CHOL", "TRIG", "HDL", "CHOLHDL", "VLDL", "LDLCALC" No results found for: "TSH"  Therapeutic Level Labs: No results found for: "LITHIUM" No results found for: "VALPROATE" No results found for: "CBMZ"  Current Medications: Current Outpatient Medications  Medication Sig Dispense Refill   [START ON 01/18/2023] venlafaxine XR (EFFEXOR-XR) 150 MG 24 hr capsule Take 1 capsule (150 mg total) by mouth daily with breakfast. Start after completing 75 mg daily for one week 30 capsule 1   venlafaxine XR (EFFEXOR-XR) 37.5 MG 24 hr capsule Take 1 capsule (37.5 mg total) by mouth daily with breakfast for 7 days, THEN 2 capsules (75 mg total) daily with breakfast for 7 days. 21 capsule 0   acetaminophen (TYLENOL) 325 MG tablet Take by mouth.     albuterol (PROVENTIL HFA;VENTOLIN HFA) 108 (90 Base) MCG/ACT inhaler Inhale 2-4 puffs by mouth every 4 hours as needed for wheezing, cough, and/or shortness of breath (Patient taking differently: Inhale 2 puffs  into the lungs every 4 (four) hours as needed for wheezing or shortness of breath. Inhale 2-4 puffs by mouth every 4 hours as needed for wheezing, cough, and/or shortness of breath) 1 Inhaler 1   atorvastatin (LIPITOR) 10 MG tablet TAKE 1 TABLET BY MOUTH ONCE DAILY. 90 tablet 2   Brexpiprazole (REXULTI) 0.5 MG TABS Take 1 tablet (0.5 mg total) by mouth daily. 30 tablet 1   carvedilol (COREG) 6.25 MG tablet Take 6.25 mg by mouth daily.     dapagliflozin propanediol (FARXIGA) 10 MG TABS tablet Take 10 mg by mouth daily.     ENTRESTO 97-103 MG Take 1 tablet by mouth 2 (two) times daily.     ferrous sulfate 325 (65 FE) MG tablet Take 325 mg by mouth daily with breakfast. (Patient not taking: Reported on 06/23/2022)     fluticasone (FLONASE) 50 MCG/ACT nasal spray Place into both nostrils.     furosemide (LASIX) 40 MG tablet Take 40 mg by mouth daily.      levothyroxine (SYNTHROID, LEVOTHROID) 75 MCG tablet Take 75 mcg by mouth daily before breakfast.      meloxicam (MOBIC) 15 MG tablet Take 15 mg by mouth daily.     mometasone-formoterol (DULERA) 200-5 MCG/ACT AERO Inhale 2 puffs into the lungs 2 (two) times daily. 1 each 0   Multiple Vitamins-Minerals (MULTIVITAMIN WITH MINERALS) tablet Take 1 tablet by mouth daily.     OXYGEN Inhale 2-3 L/min into the lungs as needed (dyspnea, COPD related symptoms, obsesity related hypoventilation syndrome).     OZEMPIC, 0.25 OR 0.5 MG/DOSE, 2 MG/1.5ML SOPN Inject into the skin.     OZEMPIC, 2 MG/DOSE, 8 MG/3ML SOPN Inject into the skin.     progesterone (PROMETRIUM) 200 MG capsule Take 200 mg by mouth at bedtime.     Spacer/Aero-Holding Chambers (AEROCHAMBER MV) inhaler Use as instructed 1 each 0   spironolactone (ALDACTONE) 25 MG tablet Take 25 mg by mouth daily.     Tiotropium Bromide Monohydrate (SPIRIVA RESPIMAT) 2.5  MCG/ACT AERS Inhale 2 puffs into the lungs daily. 4 g 0   traMADol (ULTRAM) 50 MG tablet Take 50 mg by mouth daily as needed. (Patient not  taking: Reported on 11/10/2022)     traZODone (DESYREL) 50 MG tablet Take 3 tablets (150 mg total) by mouth at bedtime. 90 tablet 1   No current facility-administered medications for this visit.     Musculoskeletal: Strength & Muscle Tone: within normal limits Gait & Station: normal Patient leans: N/A  Psychiatric Specialty Exam: Review of Systems  Psychiatric/Behavioral:  Positive for dysphoric mood and sleep disturbance. Negative for agitation, behavioral problems, confusion, decreased concentration, hallucinations, self-injury and suicidal ideas. The patient is nervous/anxious. The patient is not hyperactive.   All other systems reviewed and are negative.   Last menstrual period 02/21/2020.There is no height or weight on file to calculate BMI.  General Appearance: Well Groomed  Eye Contact:  Good  Speech:  Clear and Coherent  Volume:  Normal  Mood:  Anxious  Affect:  Appropriate, Congruent, and slightly tense  Thought Process:  Coherent  Orientation:  Full (Time, Place, and Person)  Thought Content: Logical   Suicidal Thoughts:  No  Homicidal Thoughts:  No  Memory:  Immediate;   Good  Judgement:  Good  Insight:  Good  Psychomotor Activity:  Normal  Concentration:  Concentration: Good and Attention Span: Good  Recall:  Good  Fund of Knowledge: Good  Language: Good  Akathisia:  No  Handed:  Right  AIMS (if indicated): not done  Assets:  Communication Skills Desire for Improvement  ADL's:  Intact  Cognition: WNL  Sleep:  Poor   Screenings: GAD-7    Flowsheet Row Office Visit from 04/02/2022 in [facility]  Total GAD-7 Score 4      PHQ2-9    Flowsheet Row Counselor from 11/10/2022 in [facility] [facility] Office Visit from 09/07/2954 in [facility]  PHQ-2 Total Score 4 2  PHQ-9 Total Score 12 9      Flowsheet Row Office Visit from 04/02/2022 in  [facility] Admission (Discharged) from 09/25/2021 in [facility] Pre-Admission Testing 44 from 09/18/2021 in [facility] PRE ADMISSION TESTING  C-SSRS RISK CATEGORY No Risk No Risk No Risk        Assessment and Plan:  CADINCE HILSCHER is a 62 y.o. year old female with a history of COPD, chronic respiratory failure on home oxygen, OSA, chronic systolic CHF (EF 21%, NYHA III), s/p ICD, s/p TAVR, hypothyroidism, who is referred for depression.   1. Major depressive disorder, recurrent episode, moderate (HCC) 2. Anxiety disorder, unspecified type Acute stressors include: Conflict with her son at home, feeling overcrowded in the house, knee pain Other stressors include: Adoption at age 50 (both of her parents were abusing alcohol including the time she was born), unemployment (on disability), arthritis History: depression since age 9   There has been worsening in depressive symptoms and anxiety without recent triggers despite uptitration of sertraline.  Will cross taper from sertraline to venlafaxine to see if it is more beneficial for her condition.  Discussed potential risk of hypertension.  This medication was chosen to mitigate potential drug use option, and risk of QTc prolongation.  Will continue rexulti adjunctive treatment for depression.  Noted that she reports significant benefit from this medication. Discussed risks, includes but not limited to  QTc prolongation, metabolic side effect, EPS.   3. Insomnia, unspecified type - uses CPAP machine regularly   Significant worsening.  Provided psychoeducation to improve sleep hygiene.  Will continue trazodone as needed at this time for insomnia, while implementing the interventions as outlined above.    Plan Decrease sertraline to 100 mg for 1 week, then reduce by 50 mg weekly until discontinuation Start venlafaxine 37.5 mg daily for 1 week, then  75 mg for 1 week, then 150 mg Continue rexulti 0.5 mg daily (QTc HR 80, 470 msec 06/2022) Continue Trazodone 150 mg at night as needed for insomnia Next appointment: 1/6 at 2:30, IP - on Ozempic  Past trials- trazodone, buspar, ambien,    The patient demonstrates the following risk factors for suicide: Chronic risk factors for suicide include: psychiatric disorder of depression . Acute risk factors for suicide include: family or marital conflict and unemployment. Protective factors for this patient include: positive social support. Considering these factors, the overall suicide risk at this point appears to be low. Patient is appropriate for outpatient follow up.     Collaboration of Care: Collaboration of Care: Other reviewed notes in Epic  Patient/Guardian was advised Release of Information must be obtained prior to any record release in order to collaborate their care with an outside provider. Patient/Guardian was advised if they have not already done so to contact the registration department to sign all necessary forms in order for [place] to release information regarding their care.   Consent: Patient/Guardian gives verbal consent for treatment and assignment of benefits for services provided during this visit. Patient/Guardian expressed understanding and agreed to proceed.    Neysa Hotter, MD 01/05/2023, 1:36 PM

## 2023-01-05 ENCOUNTER — Telehealth (INDEPENDENT_AMBULATORY_CARE_PROVIDER_SITE_OTHER): Payer: 59 | Admitting: Psychiatry

## 2023-01-05 ENCOUNTER — Encounter: Payer: Self-pay | Admitting: Psychiatry

## 2023-01-05 DIAGNOSIS — G4700 Insomnia, unspecified: Secondary | ICD-10-CM | POA: Diagnosis not present

## 2023-01-05 DIAGNOSIS — F331 Major depressive disorder, recurrent, moderate: Secondary | ICD-10-CM

## 2023-01-05 DIAGNOSIS — F419 Anxiety disorder, unspecified: Secondary | ICD-10-CM | POA: Diagnosis not present

## 2023-01-05 MED ORDER — VENLAFAXINE HCL ER 150 MG PO CP24: 150.0000 mg | ORAL_CAPSULE | Freq: Every day | ORAL | 1 refills | Status: DC

## 2023-01-05 MED ORDER — VENLAFAXINE HCL ER 37.5 MG PO CP24: ORAL_CAPSULE | ORAL | 0 refills | Status: DC

## 2023-01-13 ENCOUNTER — Ambulatory Visit: Payer: 59 | Admitting: Psychology

## 2023-01-13 DIAGNOSIS — F331 Major depressive disorder, recurrent, moderate: Secondary | ICD-10-CM | POA: Diagnosis not present

## 2023-01-13 NOTE — Progress Notes (Signed)
[facility] Counselor/Therapist Progress Note  Patient ID: DESMARIE NANDIN, MRN: [id number],    Date: 01/13/2023  Time Spent: 1:31pm-2:00pm   Treatment Type: Individual Therapy  pt is seen for a virtual video visit via caregility.  Pt joins from her home, reporting privacy, and counselor from her home office.  Pt consents to virtual visit and is aware of limitations of virtual visits.   Reported Symptoms: decreased depressed mood, pt reports feeling more joy and connected in past week.   Mental Status Exam: Appearance:  Well Groomed     Behavior: Appropriate  Motor: Normal  Speech/Language:  Clear and Coherent  Affect: Appropriate  Mood: normal  Thought process: normal  Thought content:   WNL  Sensory/Perceptual disturbances:   WNL  Orientation: oriented to person, place, time/date, and situation  Attention: Good  Concentration: Good  Memory: WNL  Fund of knowledge:  Good  Insight:   Good  Judgment:  Good  Impulse Control: Good   Risk Assessment: Danger to Self:  No Self-injurious Behavior: No Danger to Others: No Duty to Warn:no Physical Aggression / Violence:No  Access to Firearms a concern: No  Gang Involvement:No   Subjective: Counselor assessed pt current functioning per pt report.  Processed w/ pt mood and improvements reported.  Explored pt concerns w/ her relationship and assisted w/ ways of communicating and engaging w/ husband.  Discussed upcoming holidays, traditions and ways to keep connected and plan for positive engagement.   Pt affect bright.  Pt reports she is feeling better in past week w/ mood.  Pt reports no depressed moods in past week.  Pt reports feeling like smiling and engaging.  Pt reported that Dr. Vanetta Shawl has changed medication and is stopping off sertraline and starting venlafaxine.   Pt reports that she is worried about her relationship w/ husband and lack of feeling he cares.  Pt reports she is expressing feelings to him and may  change for a day but then back to not engaging.  Pt receptive to ideas shared for continuing to engage.  Pt reports she plans to go to church w/ her grandson this weekend as has been positive for her in past and will invite husband.  Pt discussed enjoying holidays and tradition w/ her kids.   Interventions: Cognitive Behavioral Therapy and supportive  Diagnosis:Major depressive disorder, recurrent episode, moderate (HCC)  Plan: Pt to f/u w/ biweekly counseling. Pt to f/u w/ Psychiatrist for medication management.  Pt to f/u w/ PCP as scheduled.    Individualized Treatment Plan Strengths: seeking care and positive relationship w/ PCP  Supports: husband and kids    Goal/Needs for Treatment:  In order of importance to patient 1) reduce depression 2) improve coping w/ pain 3) ---    Client Statement of Needs: "I want to work on being happy.  Not being so sad and feeling so lonely.  I struggle with the pain and that I can't do anything about it"      Treatment Level:outpatient counseling  Symptoms:depressed mood, low energy, loss of interest, hopeless and overwhelmed.   Client Treatment Preferences:Pt want to start w/ counseling every week.  "  There's a lot I need to talk about. "  Pt to continue w/ Dr. Vanetta Shawl for medication management.     Healthcare consumer's goal for treatment:   Counselor, Forde Radon, [facility] will support the patient's ability to achieve the goals identified. Cognitive Behavioral Therapy, Assertive Communication/Conflict Resolution Training, Relaxation Training, ACT,  Humanistic and other evidenced-based practices will be used to promote progress towards healthy functioning.    Healthcare consumer will: Actively participate in therapy, working towards healthy functioning.     *Justification for Continuation/Discontinuation of Goal: R=Revised, O=Ongoing, A=Achieved, D=Discontinued   Goal 1) Decreased depressive episodes by utilizing coping skills per pt report,  counselor observation and PHQ9 score.   Baseline date 11/10/22 : Progress towards goal 0; How Often - Daily Target Date Goal Was reviewed Status Code Progress towards goal/Likert rating  11/10/23                            Goal 2) learn and utilize relaxation and mindfulness skills to assist coping w/ pain per pt report and counselor observation.   Baseline date 11/10/22: Progress towards goal 0; How Often - Daily Target Date Goal Was reviewed Status Code Progress towards goal  11/10/23                            This plan has been reviewed and created by the following participants:  This plan will be reviewed at least every 12 months. Date Behavioral Health Clinician Date Guardian/Patient   11/10/22            [name] 11/10/22 Verbal Consent Provided                                     Forde Radon [facility]

## 2023-02-01 ENCOUNTER — Ambulatory Visit: Admission: RE | Admit: 2023-02-01 | Payer: 59 | Source: Ambulatory Visit

## 2023-02-08 ENCOUNTER — Ambulatory Visit: Payer: 59 | Admitting: Psychology

## 2023-02-08 DIAGNOSIS — F419 Anxiety disorder, unspecified: Secondary | ICD-10-CM

## 2023-02-08 DIAGNOSIS — F331 Major depressive disorder, recurrent, moderate: Secondary | ICD-10-CM | POA: Diagnosis not present

## 2023-02-08 NOTE — Progress Notes (Signed)
[facility] Counselor/Therapist Progress Note  Patient ID: Kristie Dorsey, MRN: [id number],    Date: 02/08/2023  Time Spent: 1:30pm-2:00pm   Treatment Type: Individual Therapy  pt is seen for a virtual video visit via caregility.  Pt joins from her home, reporting privacy, and counselor from her home office.  Pt consents to virtual visit and is aware of limitations of virtual visits.   Reported Symptoms: increased depressed mood and feeling irritable and overwhelmed the past 2 weeks.     Mental Status Exam: Appearance:  Well Groomed     Behavior: Appropriate  Motor: Normal  Speech/Language:  Clear and Coherent  Affect: Depressed  Mood: depressed  Thought process: normal  Thought content:   WNL  Sensory/Perceptual disturbances:   WNL  Orientation: oriented to person, place, time/date, and situation  Attention: Good  Concentration: Good  Memory: WNL  Fund of knowledge:  Good  Insight:   Good  Judgment:  Good  Impulse Control: Good   Risk Assessment: Danger to Self:  No Self-injurious Behavior: No Danger to Others: No Duty to Warn:no Physical Aggression / Violence:No  Access to Firearms a concern: No  Gang Involvement:No   Subjective: Counselor assessed pt current functioning per pt report.  Processed w/ pt depressed mood and explored contributing factors.  Discussed holidays and some grief and past trauma. Assisted in identifying supports and positive connections to make. Pt affect congruent w/ report of depressed mood.  Pt reports that when stopped taking sertraline she started feeling depressed again.  Pt acknowledges that the holidays and missing adoptive parents may also be impacting.  Pt discussed how she has wanted to withdraw, has felt more irritable w/those in the house.  Pt repots was positive to talk w/ daughter who is a support and identifies that usually enjoys talking w/ her adoptive sister.  Pt reports she also sent a MyChart message to Dr. Vanetta Shawl  to inform of change since change in med regimine.    Interventions: Cognitive Behavioral Therapy and supportive  Diagnosis:Major depressive disorder, recurrent episode, moderate (HCC)  Anxiety disorder, unspecified type  Plan: Pt to f/u next week w/ counseling.   Pt to f/u w/ Psychiatrist for medication management.  Pt to f/u w/ PCP as scheduled.    Individualized Treatment Plan Strengths: seeking care and positive relationship w/ PCP  Supports: husband and kids    Goal/Needs for Treatment:  In order of importance to patient 1) reduce depression 2) improve coping w/ pain 3) ---    Client Statement of Needs: "I want to work on being happy.  Not being so sad and feeling so lonely.  I struggle with the pain and that I can't do anything about it"      Treatment Level:outpatient counseling  Symptoms:depressed mood, low energy, loss of interest, hopeless and overwhelmed.   Client Treatment Preferences:Pt want to start w/ counseling every week.  "  There's a lot I need to talk about. "  Pt to continue w/ Dr. Vanetta Shawl for medication management.     Healthcare consumer's goal for treatment:   Counselor, Kristie Dorsey, [facility] will support the patient's ability to achieve the goals identified. Cognitive Behavioral Therapy, Assertive Communication/Conflict Resolution Training, Relaxation Training, ACT, Humanistic and other evidenced-based practices will be used to promote progress towards healthy functioning.    Healthcare consumer will: Actively participate in therapy, working towards healthy functioning.     *Justification for Continuation/Discontinuation of Goal: R=Revised, O=Ongoing, A=Achieved, D=Discontinued  Goal 1) Decreased depressive episodes by utilizing coping skills per pt report, counselor observation and PHQ9 score.   Baseline date 11/10/22 : Progress towards goal 0; How Often - Daily Target Date Goal Was reviewed Status Code Progress towards goal/Likert rating  11/10/23                             Goal 2) learn and utilize relaxation and mindfulness skills to assist coping w/ pain per pt report and counselor observation.   Baseline date 11/10/22: Progress towards goal 0; How Often - Daily Target Date Goal Was reviewed Status Code Progress towards goal  11/10/23                            This plan has been reviewed and created by the following participants:  This plan will be reviewed at least every 12 months. Date Behavioral Health Clinician Date Guardian/Patient   11/10/22            [facility] Kristie Dorsey 11/10/22 Verbal Consent Provided                                   Kristie Dorsey [facility]

## 2023-02-16 ENCOUNTER — Other Ambulatory Visit: Payer: Self-pay | Admitting: Psychiatry

## 2023-02-16 MED ORDER — TRAZODONE HCL 50 MG PO TABS: 150.0000 mg | ORAL_TABLET | Freq: Every day | ORAL | 1 refills | Status: DC

## 2023-02-18 ENCOUNTER — Ambulatory Visit: Payer: 59 | Admitting: Psychology

## 2023-02-28 NOTE — Progress Notes (Signed)
 No show

## 2023-03-05 ENCOUNTER — Ambulatory Visit: Payer: 59 | Admitting: Pulmonary Disease

## 2023-03-08 ENCOUNTER — Ambulatory Visit: Payer: 59 | Admitting: Psychology

## 2023-03-08 ENCOUNTER — Ambulatory Visit (INDEPENDENT_AMBULATORY_CARE_PROVIDER_SITE_OTHER): Payer: 59 | Admitting: Psychiatry

## 2023-03-08 DIAGNOSIS — Z91199 Patient's noncompliance with other medical treatment and regimen due to unspecified reason: Secondary | ICD-10-CM

## 2023-03-23 ENCOUNTER — Other Ambulatory Visit (INDEPENDENT_AMBULATORY_CARE_PROVIDER_SITE_OTHER): Payer: Self-pay | Admitting: Nurse Practitioner

## 2023-03-26 ENCOUNTER — Other Ambulatory Visit: Payer: Self-pay | Admitting: Psychiatry

## 2023-03-26 ENCOUNTER — Telehealth: Payer: Self-pay | Admitting: Psychiatry

## 2023-03-26 MED ORDER — VENLAFAXINE HCL ER 150 MG PO CP24
150.0000 mg | ORAL_CAPSULE | Freq: Every day | ORAL | 2 refills | Status: DC
Start: 2023-03-26 — End: 2023-06-17

## 2023-03-26 NOTE — Telephone Encounter (Signed)
Ordered

## 2023-03-26 NOTE — Telephone Encounter (Signed)
Patient called to schedule follow up. Reviewed no show policy due to 2 no shows. She scheduled in person for 05-31-23. States she does need a refill on venlaafaxine. Send to [facility]. Thank you

## 2023-03-26 NOTE — Telephone Encounter (Signed)
received fax requesting a refill on the venlafaxine 37.5mg . pt was last seen on 11-5 next appt 3-31

## 2023-03-29 NOTE — Telephone Encounter (Signed)
pt had already been notified and has already picked up medication.

## 2023-03-30 ENCOUNTER — Other Ambulatory Visit: Payer: Self-pay | Admitting: Family Medicine

## 2023-03-30 DIAGNOSIS — Z1231 Encounter for screening mammogram for malignant neoplasm of breast: Secondary | ICD-10-CM

## 2023-04-05 ENCOUNTER — Encounter: Payer: Self-pay | Admitting: Acute Care

## 2023-04-12 ENCOUNTER — Other Ambulatory Visit: Payer: Self-pay | Admitting: Psychiatry

## 2023-04-12 ENCOUNTER — Telehealth: Payer: Self-pay

## 2023-04-12 MED ORDER — TRAZODONE HCL 50 MG PO TABS: 150.0000 mg | ORAL_TABLET | Freq: Every day | ORAL | 1 refills | Status: DC

## 2023-04-12 NOTE — Telephone Encounter (Signed)
 Pt.notified

## 2023-04-12 NOTE — Telephone Encounter (Signed)
 Ordered

## 2023-04-12 NOTE — Telephone Encounter (Signed)
 received fax requesting a refill on the trazodone . pt was last seen on 11-5 next appt 3-31

## 2023-04-20 ENCOUNTER — Other Ambulatory Visit: Payer: Self-pay

## 2023-04-20 ENCOUNTER — Encounter: Payer: Self-pay | Admitting: Pulmonary Disease

## 2023-04-20 ENCOUNTER — Ambulatory Visit: Payer: 59 | Admitting: Pulmonary Disease

## 2023-04-20 ENCOUNTER — Telehealth: Payer: Self-pay

## 2023-04-20 VITALS — BP 132/80 | HR 74 | Temp 97.1°F | Ht 61.0 in | Wt 283.0 lb

## 2023-04-20 DIAGNOSIS — Z6841 Body Mass Index (BMI) 40.0 and over, adult: Secondary | ICD-10-CM

## 2023-04-20 DIAGNOSIS — F17210 Nicotine dependence, cigarettes, uncomplicated: Secondary | ICD-10-CM

## 2023-04-20 DIAGNOSIS — Z122 Encounter for screening for malignant neoplasm of respiratory organs: Secondary | ICD-10-CM

## 2023-04-20 DIAGNOSIS — E662 Morbid (severe) obesity with alveolar hypoventilation: Secondary | ICD-10-CM

## 2023-04-20 DIAGNOSIS — R0602 Shortness of breath: Secondary | ICD-10-CM | POA: Diagnosis not present

## 2023-04-20 DIAGNOSIS — G4733 Obstructive sleep apnea (adult) (pediatric): Secondary | ICD-10-CM

## 2023-04-20 DIAGNOSIS — J449 Chronic obstructive pulmonary disease, unspecified: Secondary | ICD-10-CM

## 2023-04-20 DIAGNOSIS — Z87891 Personal history of nicotine dependence: Secondary | ICD-10-CM

## 2023-04-20 NOTE — Telephone Encounter (Signed)
Called and spoke with patient. Annual LCS scheduled for 05/04/2023 at [facility]

## 2023-04-20 NOTE — Progress Notes (Unsigned)
Subjective:    Patient ID: Kristie Dorsey, female    DOB: 06-21-1960, 63 y.o.   MRN: [id number]  Patient Care Team: Oswaldo Conroy, MD as PCP - General (Family Medicine) Jim Like, RN as Registered Nurse Scarlett Presto, RN (Inactive) as Registered Nurse  Chief Complaint  Patient presents with  . Follow-up    DOE. No wheezing or cough.    BACKGROUND:   HPI    Review of Systems A 10 point review of systems was performed and it is as noted above otherwise negative.   Past Medical History:  Diagnosis Date  . Abnormal uterine bleeding due to endometrial polyp   . Anemia   . Anginal pain (HCC)   . Aortic atherosclerosis (HCC)   . Arthritis   . Cardiomyopathy (HCC)    a.) TTE 03/25/2018: EF 30-35%; b.) TTE 08/03/2019: EF 30%; c.) TTE 01/15/2020: EF 35%; d.) TTE 12/31/2020: EF 30%; e.) TTE 08/20/2021: EF 25%  . Carotid stenosis    a.) s/p RIGHT CEA 06/20/2020  . CHF (congestive heart failure) (HCC)    a.) TTE 03/25/18: EF 30-35%, diff HK, mild MR, sev AS (MPG 39), G1DD; b.) TTE 08/03/19: EF 30%, mild LVH, glob HK, mod LAE, mild MR, triv TR; c.) TTE 01/15/20: EF 35%, mild LVH, basal-apical/antsep/infsep HK, mild LAE, triv TR/PR, mod MR, G1DD; d.) TTE 12/31/20: EF 30%, mod glob HK, mod LAE, triv TR/PR, mild MR, G1DD; e.) TTE 08/20/21: EF 25%, mod MVE/LAE, glob HK, septal AK, MAC, triv TR, mild MR  . Complication of anesthesia    a.) delayed emergence following AVR  . COPD (chronic obstructive pulmonary disease) (HCC)   . Coronary artery disease   . Depression   . Dyspnea   . Hypertension   . Hypothyroidism   . LBBB (left bundle branch block)   . Morbid obesity (HCC)   . MRSA nasal colonization 06/20/2020  . Nonrheumatic aortic (valve) stenosis    a.) TTE 03/25/2018: EF 30-35, sev AS (MPG 39 mmHg); b.) 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; 23 mm Edwards SAPIEN 3  . Obesity hypoventilation syndrome (HCC)   . On supplemental oxygen therapy    a.) 2-3 L/[place] as needed  . OSA  treated with BiPAP   . T2DM (type 2 diabetes mellitus) (HCC)     Past Surgical History:  Procedure Laterality Date  . CHOLECYSTECTOMY    . DILATATION & CURETTAGE/HYSTEROSCOPY WITH MYOSURE N/A 09/25/2021   Procedure: FRACTIONAL DILATATION & CURETTAGE/HYSTEROSCOPY;  Surgeon: Schermerhorn, Ihor Austin, MD;  Location: [facility];  Service: Gynecology;  Laterality: N/A;  . ENDARTERECTOMY Right 06/20/2020   Procedure: ENDARTERECTOMY CAROTID;  Surgeon: Annice Needy, MD;  Location: [facility] OR[facility];  Service: Vascular;  Laterality: Right;  . HYSTEROSCOPY WITH D & C N/A 02/28/2020   Procedure: DILATATION AND CURETTAGE;  Surgeon: Ward, Elenora Fender, MD;  Location: [facility];  Service: Gynecology;  Laterality: N/A;  . LEFT HEART CATH AND CORONARY ANGIOGRAPHY N/A 04/14/2018   Procedure: LEFT HEART CATH AND CORONARY ANGIOGRAPHY;  Surgeon: Alwyn Pea, MD;  Location: [facility]IVE [facility];  Service: Cardiovascular;  Laterality: N/A;  . TONSILLECTOMY    . TRANSCATHETER AORTIC VALVE REPLACEMENT, TRANSAORTIC Right 11/01/2018   Procedure: TRANSCATHETER AORTIC VALVE REPLACEMENT, TRANSAORTIC via a RIGHT FEMORAL APPROACH; Location: [facility]; Surgeon: Elyn Peers, MD  . TUBAL LIGATION      Patient Active Problem List   Diagnosis Date Noted  . COPD (chronic obstructive pulmonary disease) (HCC) 06/23/2021  . OSA  treated with BiPAP 06/23/2021  . Chronic respiratory failure with hypoxia (HCC) 06/23/2021  . Carotid stenosis, right 06/20/2020  . Postmenopausal bleeding 05/28/2020  . Carotid stenosis 12/05/2019  . Pain in limb 12/05/2019  . Electronic cigarette use 09/22/2019  . Dyspnea on exertion 09/22/2019  . Depression 04/10/2019  . Class 3 severe obesity with body mass index (BMI) of 60.0 to 69.9 in adult (HCC) 12/09/2018  . 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 Chronic systolic congestive heart failure (HCC) 04/27/2018  . Nonrheumatic aortic (valve) stenosis 04/27/2018  . HTN (hypertension)  03/11/2018  . Former smoker 03/11/2018  . Snoring 03/11/2018  . Lymphedema 03/11/2018  . Tobacco use disorder 03/11/2018    Family History  Adopted: Yes  Problem Relation Age of Onset  . Alcohol abuse Mother   . Aneurysm Mother   . Alcohol abuse Father   . Cancer Father   . Depression Daughter   . ADD / ADHD Son   . Bipolar disorder Son     Social History   Tobacco Use  . Smoking status: Every Day    Current packs/day: 1.50    Average packs/day: 1.5 packs/day for 50.1 years (75.2 ttl pk-yrs)    Types: Cigarettes    Start date: 03/02/1973  . Smokeless tobacco: Never  . Tobacco comments:    Smokes 0.5 PPD khj 04/20/2023        Started smoking at 63 yrs old    Smoked 2 PPD at her heaviest.  Substance Use Topics  . Alcohol use: No    No Known Allergies  Current Meds  Medication Sig  . acetaminophen (TYLENOL) 325 MG tablet Take by mouth.  Marland Kitchen albuterol (PROVENTIL HFA;VENTOLIN HFA) 108 (90 Base) MCG/ACT inhaler Inhale 2-4 puffs by mouth every 4 hours as needed for wheezing, cough, and/or shortness of breath (Patient taking differently: Inhale 2 puffs into the lungs every 4 (four) hours as needed for wheezing or shortness of breath. Inhale 2-4 puffs by mouth every 4 hours as needed for wheezing, cough, and/or shortness of breath)  . atorvastatin (LIPITOR) 10 MG tablet TAKE 1 TABLET BY MOUTH ONCE DAILY.  . carvedilol (COREG) 6.25 MG tablet Take 6.25 mg by mouth daily.  . dapagliflozin propanediol (FARXIGA) 10 MG TABS tablet Take 10 mg by mouth daily.  Marland Kitchen ENTRESTO 97-103 MG Take 1 tablet by mouth 2 (two) times daily.  . ferrous sulfate 325 (65 FE) MG tablet Take 325 mg by mouth daily with breakfast.  . fluticasone (FLONASE) 50 MCG/ACT nasal spray Place into both nostrils.  . furosemide (LASIX) 40 MG tablet Take 40 mg by mouth daily.   Marland Kitchen levothyroxine (SYNTHROID, LEVOTHROID) 75 MCG tablet Take 75 mcg by mouth daily before breakfast.   . meloxicam (MOBIC) 15 MG tablet Take 15 mg by  mouth daily.  . mometasone-formoterol (DULERA) 200-5 MCG/ACT AERO Inhale 2 puffs into the lungs 2 (two) times daily.  . Multiple Vitamins-Minerals (MULTIVITAMIN WITH MINERALS) tablet Take 1 tablet by mouth daily.  . OXYGEN Inhale 2-3 L/min into the lungs as needed (dyspnea, COPD related symptoms, obsesity related hypoventilation syndrome).  Marland Kitchen OZEMPIC, 0.25 OR 0.5 MG/DOSE, 2 MG/1.5ML SOPN Inject into the skin.  Marland Kitchen OZEMPIC, 2 MG/DOSE, 8 MG/3ML SOPN Inject into the skin.  . progesterone (PROMETRIUM) 200 MG capsule Take 200 mg by mouth at bedtime.  Marland Kitchen Spacer/Aero-Holding Chambers (AEROCHAMBER MV) inhaler Use as instructed  . spironolactone (ALDACTONE) 25 MG tablet Take 25 mg by mouth daily.  Marland Kitchen  Tiotropium Bromide Monohydrate (SPIRIVA RESPIMAT) 2.5 MCG/ACT AERS Inhale 2 puffs into the lungs daily.  . traMADol (ULTRAM) 50 MG tablet Take 50 mg by mouth daily as needed.  . traZODone (DESYREL) 50 MG tablet Take 3 tablets (150 mg total) by mouth at bedtime.  Marland Kitchen venlafaxine XR (EFFEXOR-XR) 150 MG 24 hr capsule Take 1 capsule (150 mg total) by mouth daily with breakfast.    Immunization History  Administered Date(s) Administered  . Influenza,inj,Quad PF,6+ Mos 01/11/2020  . Moderna Sars-Covid-2 Vaccination 11/14/2019, 12/12/2019        Objective:     BP 132/80 (BP Location: Right Wrist, Cuff Size: Large)   Pulse 74   Temp (!) 97.1 F (36.2 C)   Ht 5\' 1"  (1.549 m)   Wt 283 lb (128.4 kg) Comment: per patient. in a wheelchair today  LMP 02/21/2020   SpO2 94%   BMI 53.47 kg/m   SpO2: 94 % O2 Device: None (Room air)  GENERAL: Morbidly obese woman, no acute distress.  No conversational dyspnea.  Presents in transport chair. HEAD: Normocephalic, atraumatic.  EYES: Pupils equal, round, reactive to light.  No scleral icterus.  MOUTH: Nose/mouth/throat not examined due to masking requirements for COVID 19. NECK: Supple. No thyromegaly. Trachea midline. No JVD.  No adenopathy. PULMONARY: Good air  entry bilaterally.  No adventitious sounds whatsoever. CARDIOVASCULAR: S1 and S2. Regular rate and rhythm.  Soft 1/6 systolic murmur left sternal border, unchanged from prior. ABDOMEN: Obese, otherwise benign MUSCULOSKELETAL: No joint deformity, no clubbing, there is 1+ to 2+ pitting edema lower extremities (chronic). NEUROLOGIC: No overt focal deficit, speech is fluent. SKIN: Intact,warm,dry.  On limited exam no rashes PSYCH: Mood and behavior normal.        Assessment & Plan:   No diagnosis found.  No orders of the defined types were placed in this encounter.   No orders of the defined types were placed in this encounter.    Advised if symptoms do not improve or worsen, to please contact office for sooner follow up or seek emergency care.    I spent xxx minutes of dedicated to the care of this patient on the date of this encounter to include pre-visit review of records, face-to-face time with the patient discussing conditions above, post visit ordering of testing, clinical documentation with the electronic health record, making appropriate referrals as documented, and communicating necessary findings to members of the patients care team.   C. Danice Goltz, MD Advanced Bronchoscopy PCCM [facility]    *This note was dictated using voice recognition software/Dragon.  Despite best efforts to proofread, errors can occur which can change the meaning. Any transcriptional errors that result from this process are unintentional and may not be fully corrected at the time of dictation.

## 2023-04-20 NOTE — Patient Instructions (Signed)
VISIT SUMMARY:  Today, we reviewed your respiratory health and equipment needs. Your breathing has been stable, and you are doing well with your inhalers and BiPAP machine. We discussed your occasional shortness of breath, your participation in the lung cancer screening program, and your need for equipment refills.  YOUR PLAN:  -CHRONIC OBSTRUCTIVE PULMONARY DISEASE (COPD): COPD is a chronic lung condition that makes it hard to breathe. Your condition is well-managed with your current medications and BiPAP machine. We will refill your Spiriva and Dulera inhalers, and your albuterol inhaler as needed. We will also send approval to [facility] for your nasal cannulas and ensure [employer] is providing your BiPAP supplies.  -LUNG CANCER SCREENING: Lung cancer screening is a program to detect lung cancer early. You are enrolled in this program but need a new referral to continue. We will send the referral for you.  -GENERAL HEALTH MAINTENANCE: You are doing well with your BiPAP therapy and inhaler regimen. We will schedule a follow-up appointment in six months to monitor your progress.  INSTRUCTIONS:  Please continue using your inhalers and BiPAP machine as prescribed. We will send the necessary approvals and referrals for your equipment and lung cancer screening. Your next follow-up appointment is scheduled for six months from now.

## 2023-04-21 ENCOUNTER — Encounter: Payer: Self-pay | Admitting: Pulmonary Disease

## 2023-04-21 MED ORDER — ALBUTEROL SULFATE HFA 108 (90 BASE) MCG/ACT IN AERS: 2.0000 | INHALATION_SPRAY | Freq: Four times a day (QID) | RESPIRATORY_TRACT | 2 refills | Status: AC | PRN

## 2023-04-21 MED ORDER — SPIRIVA RESPIMAT 2.5 MCG/ACT IN AERS: 2.0000 | INHALATION_SPRAY | Freq: Every day | RESPIRATORY_TRACT | 11 refills | Status: AC

## 2023-04-21 MED ORDER — ALBUTEROL SULFATE HFA 108 (90 BASE) MCG/ACT IN AERS: 2.0000 | INHALATION_SPRAY | Freq: Four times a day (QID) | RESPIRATORY_TRACT | 2 refills | Status: DC | PRN

## 2023-04-21 MED ORDER — DULERA 200-5 MCG/ACT IN AERO: 2.0000 | INHALATION_SPRAY | Freq: Two times a day (BID) | RESPIRATORY_TRACT | 11 refills | Status: DC

## 2023-05-03 ENCOUNTER — Ambulatory Visit
Admission: RE | Admit: 2023-05-03 | Discharge: 2023-05-03 | Disposition: A | Discharge status: Home / Self Care | Payer: 59 | Source: Ambulatory Visit | Attending: Acute Care | Admitting: Acute Care

## 2023-05-03 DIAGNOSIS — Z122 Encounter for screening for malignant neoplasm of respiratory organs: Secondary | ICD-10-CM | POA: Diagnosis present

## 2023-05-03 DIAGNOSIS — F17210 Nicotine dependence, cigarettes, uncomplicated: Secondary | ICD-10-CM | POA: Insufficient documentation

## 2023-05-03 DIAGNOSIS — Z87891 Personal history of nicotine dependence: Secondary | ICD-10-CM | POA: Diagnosis present

## 2023-05-26 NOTE — Progress Notes (Signed)
 No show

## 2023-05-31 ENCOUNTER — Telehealth: Payer: Self-pay

## 2023-05-31 ENCOUNTER — Ambulatory Visit (INDEPENDENT_AMBULATORY_CARE_PROVIDER_SITE_OTHER): Payer: 59 | Admitting: Psychiatry

## 2023-05-31 DIAGNOSIS — Z91199 Patient's noncompliance with other medical treatment and regimen due to unspecified reason: Secondary | ICD-10-CM

## 2023-05-31 NOTE — Telephone Encounter (Signed)
 Copied from CRM [id number]. Topic: Clinical - Lab/Test Results >> May 31, 2023 10:30 AM Renie Ora wrote: Reason for CRM: Patient calling in regarding her Lung Scan results. Patient stated he had a Lung Scan March 3rd.  Spoke with patient regarding prior message .Advised patient it is taking radiology 4-6 weeks to read scans . Patient's voice was understanding. Nothing else further needed.

## 2023-06-01 ENCOUNTER — Other Ambulatory Visit: Payer: Self-pay

## 2023-06-01 DIAGNOSIS — Z122 Encounter for screening for malignant neoplasm of respiratory organs: Secondary | ICD-10-CM

## 2023-06-01 DIAGNOSIS — F17210 Nicotine dependence, cigarettes, uncomplicated: Secondary | ICD-10-CM

## 2023-06-01 DIAGNOSIS — Z87891 Personal history of nicotine dependence: Secondary | ICD-10-CM

## 2023-06-12 NOTE — Progress Notes (Unsigned)
 [facility] MD/PA/NP OP Progress Note  06/17/2023 12:31 PM Kristie Dorsey  MRN:  [id number]  Chief Complaint:  Chief Complaint  Patient presents with   Follow-up   HPI:  - she is not seen since Nov 2024 This is a follow-up appointment for depression, anxiety and insomnia.  She states that she is doing better than before.  She is hoping to lose weight.  Although she uses walker at home at times, she does not like it as she has concerned about imbalance.  She has knee pain.  She has an upcoming appointment with orthopedics, and she is willing to discuss possible PT referral.  She has been trying to eat healthy food.  She denies any binge eating since being on Ozempic.  She believes her mood is pretty good except the time when she is with her son.  It is overwhelming as he talks all the time.  It is annoying as he speaks to her when she was reading.  She does not go to church anymore, and does not have friends. She denies insomnia. She had a few episodes of feeling down.  It occurred a few days ago.  She did not have any energy.  She was able to go outside, and felt better afterwards.  She denies SI.  She reports feeling down when she was on venlafaxine only.  Her mood has been better since being on sertraline along with venlafaxine.   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 Both of her biological parents abused alcohol. Her mother was using alcohol when she was born  Visit Diagnosis:    ICD-10-CM   1. MDD (major depressive disorder), recurrent episode, mild (HCC)  F33.0     2. Anxiety disorder, unspecified type  F41.9     3. Insomnia, unspecified type  G47.00       Past Psychiatric History: Please see initial evaluation for full details. I have reviewed the history. No updates at this time.     Past Medical History:  Past Medical History:  Diagnosis Date   Abnormal uterine bleeding due to endometrial polyp    Anemia    Anginal pain (HCC)    Aortic atherosclerosis (HCC)    Arthritis    Cardiomyopathy (HCC)    a.) TTE 03/25/2018: EF 30-35%; b.) TTE 08/03/2019: EF 30%; c.) TTE 01/15/2020: EF 35%; d.) TTE 12/31/2020: EF 30%; e.) TTE 08/20/2021: EF 25%   Carotid stenosis    a.) s/p RIGHT CEA 06/20/2020   CHF (congestive heart failure) (HCC)    a.) TTE 03/25/18: EF 30-35%, diff HK, mild MR, sev AS (MPG 39), G1DD; b.) TTE 08/03/19: EF 30%, mild LVH, glob HK, mod LAE, mild MR, triv TR; c.) TTE 01/15/20: EF 35%, mild LVH, basal-apical/antsep/infsep HK,  mild LAE, triv TR/PR, mod MR, G1DD; d.) TTE 12/31/20: EF 30%, mod glob HK, mod LAE, triv TR/PR, mild MR, G1DD; e.) TTE 08/20/21: EF 25%, mod MVE/LAE, glob HK, septal AK, MAC, triv TR, mild MR   Complication of anesthesia    a.) delayed emergence following AVR   COPD (chronic obstructive pulmonary disease) (HCC)    Coronary artery disease    Depression    Dyspnea    Hypertension    Hypothyroidism    LBBB (left bundle branch block)    Morbid obesity (HCC)    MRSA nasal colonization 06/20/2020   Nonrheumatic aortic (valve) stenosis    a.) TTE 03/25/2018: EF 30-35, sev AS (MPG 39 mmHg); b.) 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; 23 mm Edwards SAPIEN 3   Obesity hypoventilation syndrome (HCC)    On supplemental oxygen therapy    a.) 2-3 L/[place] as needed   OSA treated with BiPAP    T2DM (type 2 diabetes mellitus) (HCC)      Past Surgical History:  Procedure Laterality Date   CHOLECYSTECTOMY     DILATATION & CURETTAGE/HYSTEROSCOPY WITH MYOSURE N/A 09/25/2021   Procedure: FRACTIONAL DILATATION & CURETTAGE/HYSTEROSCOPY;  Surgeon: Schermerhorn, Joselyn Nicely, MD;  Location: [facility];  Service: Gynecology;  Laterality: N/A;   ENDARTERECTOMY Right 06/20/2020   Procedure: ENDARTERECTOMY CAROTID;  Surgeon: Celso College, MD;  Location: [facility];  Service: Vascular;  Laterality: Right;   HYSTEROSCOPY WITH D & C N/A 02/28/2020   Procedure: DILATATION AND CURETTAGE;  Surgeon: Ward, Margarie Shay, MD;  Location: [facility];  Service: Gynecology;  Laterality: N/A;   LEFT HEART CATH AND CORONARY ANGIOGRAPHY N/A 04/14/2018   Procedure: LEFT HEART CATH AND CORONARY ANGIOGRAPHY;  Surgeon: Antonette Batters, MD;  Location: [facility];  Service: Cardiovascular;  Laterality: N/A;   TONSILLECTOMY     TRANSCATHETER AORTIC VALVE REPLACEMENT, TRANSAORTIC Right 11/01/2018   Procedure: TRANSCATHETER AORTIC VALVE REPLACEMENT, TRANSAORTIC via a RIGHT FEMORAL APPROACH; Location: [facility]; Surgeon: Hellen Lob, MD   TUBAL LIGATION      Family Psychiatric History: Please see initial evaluation for full details. I have reviewed the history. No updates at this time.     Family History:  Family History  Adopted: Yes  Problem Relation Age of Onset   Alcohol abuse Mother    Aneurysm Mother    Alcohol abuse Father    Cancer Father    Depression Daughter    ADD / ADHD Son    Bipolar disorder Son     Social History:  Social History   Socioeconomic History   Marital status: Married    Spouse name: eugene   Number of children: 2   Years of education: Not on file   Highest education level: Not on file  Occupational History   Occupation: Designer, multimedia    Comment: disability  Tobacco Use   Smoking status: Every Day    Current packs/day: 1.50    Average packs/day: 1.5 packs/day for 50.3 years (75.4 ttl pk-yrs)    Types: Cigarettes     Start date: 03/02/1973   Smokeless tobacco: Never   Tobacco comments:    Smokes 0.5 PPD khj 04/20/2023        Started smoking at 63 yrs old    Smoked 2 PPD at her heaviest.  Vaping Use   Vaping status: Some Days   Start date: 11/01/2018   Substances: Nicotine   Devices: trying to quit  Substance and Sexual Activity   Alcohol use: No  Drug use: No   Sexual activity: Not Currently  Other Topics Concern   Not on file  Social History Narrative   Patient lives with husband and 2 daughters. Has dogs.   Patient feels safe in her home.   She still vapes on a daily basis. Nicotine in her liquid.   Social Drivers of Corporate investment banker Strain: Not on file  Food Insecurity: Food Insecurity Present (11/02/2018)   [facility], [facility]   Hunger Vital Sign    Worried About Running Out of Food in the Last Year: Sometimes true    Ran Out of Food in the Last Year: Sometimes true  Transportation Needs: Not on file  Physical Activity: Not on file  Stress: Not on file  Social Connections: Not on file    Allergies: No Known Allergies  Metabolic Disorder Labs: No results found for: "HGBA1C", "MPG" No results found for: "PROLACTIN" No results found for: "CHOL", "TRIG", "HDL", "CHOLHDL", "VLDL", "LDLCALC" No results found for: "TSH"  Therapeutic Level Labs: No results found for: "LITHIUM" No results found for: "VALPROATE" No results found for: "CBMZ"  Current Medications: Current Outpatient Medications  Medication Sig Dispense Refill   acetaminophen (TYLENOL) 325 MG tablet Take by mouth.     albuterol (VENTOLIN HFA) 108 (90 Base) MCG/ACT inhaler Inhale 2 puffs into the lungs every 6 (six) hours as needed for wheezing or shortness of breath. 8 g 2   atorvastatin (LIPITOR) 10 MG tablet TAKE 1 TABLET BY MOUTH ONCE DAILY. 90 tablet 0   carvedilol (COREG) 6.25 MG tablet Take 6.25 mg by mouth daily.     dapagliflozin propanediol (FARXIGA) 10 MG TABS tablet Take  10 mg by mouth daily.     ENTRESTO 97-103 MG Take 1 tablet by mouth 2 (two) times daily.     ferrous sulfate 325 (65 FE) MG tablet Take 325 mg by mouth daily with breakfast.     fluticasone (FLONASE) 50 MCG/ACT nasal spray Place into both nostrils.     furosemide (LASIX) 40 MG tablet Take 40 mg by mouth daily.      levothyroxine (SYNTHROID, LEVOTHROID) 75 MCG tablet Take 75 mcg by mouth daily before breakfast.      meloxicam (MOBIC) 15 MG tablet Take 15 mg by mouth daily.     mometasone-formoterol (DULERA) 200-5 MCG/ACT AERO Inhale 2 puffs into the lungs 2 (two) times daily. 1 each 11   Multiple Vitamins-Minerals (MULTIVITAMIN WITH MINERALS) tablet Take 1 tablet by mouth daily.     OXYGEN Inhale 2-3 L/min into the lungs as needed (dyspnea, COPD related symptoms, obsesity related hypoventilation syndrome).     OZEMPIC, 0.25 OR 0.5 MG/DOSE, 2 MG/1.5ML SOPN Inject into the skin.     OZEMPIC, 2 MG/DOSE, 8 MG/3ML SOPN Inject into the skin.     progesterone (PROMETRIUM) 200 MG capsule Take 200 mg by mouth at bedtime.     Spacer/Aero-Holding Chambers (AEROCHAMBER MV) inhaler Use as instructed 1 each 0   spironolactone (ALDACTONE) 25 MG tablet Take 25 mg by mouth daily.     Tiotropium Bromide Monohydrate (SPIRIVA RESPIMAT) 2.5 MCG/ACT AERS Inhale 2 puffs into the lungs daily. 4 g 11   traMADol (ULTRAM) 50 MG tablet Take 50 mg by mouth daily as needed.     venlafaxine XR (EFFEXOR-XR) 75 MG 24 hr capsule Take 1 capsule (75 mg total) by mouth daily with breakfast. Take total of 225 mg daily, take along with 150 mg  cap 30 capsule 1   traZODone (DESYREL) 50 MG tablet Take 3 tablets (150 mg total) by mouth at bedtime. 90 tablet 3   [START ON 06/24/2023] venlafaxine XR (EFFEXOR-XR) 150 MG 24 hr capsule Take 1 capsule (150 mg total) by mouth daily with breakfast. 30 capsule 3   No current facility-administered medications for this visit.     Musculoskeletal: Strength & Muscle Tone: within normal  limits Gait & Station:  in a wheel chair Patient leans: N/A  Psychiatric Specialty Exam: Review of Systems  Psychiatric/Behavioral:  Positive for dysphoric mood. Negative for agitation, behavioral problems, confusion, decreased concentration, hallucinations, self-injury, sleep disturbance and suicidal ideas. The patient is nervous/anxious. The patient is not hyperactive.   All other systems reviewed and are negative.   Blood pressure 124/86, pulse 84, temperature 98.4 F (36.9 C), temperature source Temporal, last menstrual period 02/21/2020, SpO2 94%.There is no height or weight on file to calculate BMI.  General Appearance: Well Groomed  Eye Contact:  Good  Speech:  Clear and Coherent  Volume:  Normal  Mood:   better  Affect:  Appropriate, Congruent, and Full Range  Thought Process:  Coherent  Orientation:  Full (Time, Place, and Person)  Thought Content: Logical   Suicidal Thoughts:  No  Homicidal Thoughts:  No  Memory:  Immediate;   Good  Judgement:  Good  Insight:  Good  Psychomotor Activity:  Normal  Concentration:  Concentration: Good and Attention Span: Good  Recall:  Good  Fund of Knowledge: Good  Language: Good  Akathisia:  No  Handed:  Right  AIMS (if indicated): not done  Assets:  Communication Skills Desire for Improvement  ADL's:  Intact  Cognition: WNL  Sleep:  Fair   Screenings: GAD-7    Flowsheet Row Office Visit from 06/17/2023 in [facility] Office Visit from 04/02/2022 in [facility]  Total GAD-7 Score 4 4      PHQ2-9    Flowsheet Row Office Visit from 06/17/2023 in [facility] Counselor from 11/10/2022 in [facility] Office Visit from 03/07/1094 in [facility]  PHQ-2 Total Score 2 4 2   PHQ-9 Total Score 5 12 9       Flowsheet Row Office  Visit from 04/02/2022 in [facility] Admission (Discharged) from 09/25/2021 in [facility] Pre-Admission Testing 44 from 09/18/2021 in [facility] PRE [facility]MISSION TESTING  C-SSRS RISK CATEGORY No Risk No Risk No Risk        Assessment and Plan:  OTA EBERSOLE is a 63 y.o. year old female with a history of COPD, chronic respiratory failure on home oxygen, OSA, chronic systolic CHF (EF 04%, NYHA III), s/p ICD, s/p TAVR, hypothyroidism, who is referred for depression.   1. MDD (major depressive disorder), recurrent episode, mild (HCC) 2. Anxiety disorder, unspecified type Acute stressors include: Conflict with her son at home, feeling overcrowded in the house, knee pain Other stressors include: Adoption at age 64 (both of her parents were abusing alcohol including the time she was born), unemployment (on disability), arthritis History: depression since age 49, originally on sertraline 100 mg daily, rexulti 0.5 mg daily, trazodone 150 mg qhs   There has been overall improvement since being on the current medication regimen.  She reports ongoing conflict with her son, who talks to her all the  time.  Although she has limited connection to the broader community, she feels comfortable with her family and enjoys engaging in VR boxing.  Will titrate venlafaxine to optimize treatment for depression and anxiety.  Will taper off sertraline to avoid polypharmacy.  Noted that she has discontinued the rexulti, and denies any change.  Will continue to hold this medication, although this medication could be considered if any worsening in her mood symptoms in the future.   3. Insomnia, unspecified type - uses CPAP machine regularly    Stable.  Will continue trazodone as needed for insomnia.    Plan Increase venlafaxine 225 mg daily  Decreased sertraline 25 mg at night for one week, then discontinue  Continue  Trazodone 150 mg at night as needed for insomnia Hold rexulti (she self disconitnued) Next appointment: 5/30 at 10:30, iP Dicussed attendance policy - on Ozempic  Continue rexulti 0.5 mg daily (QTc HR 80, 470 msec 06/2022)   Past trials- trazodone, buspar, ambien,    The patient demonstrates the following risk factors for suicide: Chronic risk factors for suicide include: psychiatric disorder of depression . Acute risk factors for suicide include: family or marital conflict and unemployment. Protective factors for this patient include: positive social support. Considering these factors, the overall suicide risk at this point appears to be low. Patient is appropriate for outpatient follow up.   Collaboration of Care: Collaboration of Care: Other reviewed notes in Epic  Patient/Guardian was advised Release of Information must be obtained prior to any record release in order to collaborate their care with an outside provider. Patient/Guardian was advised if they have not already done so to contact the registration department to sign all necessary forms in order for us  to release information regarding their care.   Consent: Patient/Guardian gives verbal consent for treatment and assignment of benefits for services provided during this visit. Patient/Guardian expressed understanding and agreed to proceed.    Todd Fossa, MD 06/17/2023, 12:31 PM

## 2023-06-17 ENCOUNTER — Ambulatory Visit (INDEPENDENT_AMBULATORY_CARE_PROVIDER_SITE_OTHER): Admitting: Psychiatry

## 2023-06-17 ENCOUNTER — Encounter: Payer: Self-pay | Admitting: Psychiatry

## 2023-06-17 VITALS — BP 124/86 | HR 84 | Temp 98.4°F

## 2023-06-17 DIAGNOSIS — F419 Anxiety disorder, unspecified: Secondary | ICD-10-CM | POA: Diagnosis not present

## 2023-06-17 DIAGNOSIS — G4700 Insomnia, unspecified: Secondary | ICD-10-CM | POA: Diagnosis not present

## 2023-06-17 DIAGNOSIS — F330 Major depressive disorder, recurrent, mild: Secondary | ICD-10-CM | POA: Diagnosis not present

## 2023-06-17 MED ORDER — TRAZODONE HCL 50 MG PO TABS: 150.0000 mg | ORAL_TABLET | Freq: Every day | ORAL | 3 refills | Status: DC

## 2023-06-17 MED ORDER — VENLAFAXINE HCL ER 150 MG PO CP24: 150.0000 mg | ORAL_CAPSULE | Freq: Every day | ORAL | 3 refills | Status: DC

## 2023-06-17 MED ORDER — VENLAFAXINE HCL ER 75 MG PO CP24: 75.0000 mg | ORAL_CAPSULE | Freq: Every day | ORAL | 1 refills | Status: DC

## 2023-06-17 NOTE — Patient Instructions (Signed)
 Increase venlafaxine 225 mg daily  Decreased sertraline 25 mg at night for one week, then discontinue  Continue Trazodone 150 mg at night as needed for insomnia Next appointment: 5/30 at 10:30,

## 2023-06-21 ENCOUNTER — Other Ambulatory Visit (INDEPENDENT_AMBULATORY_CARE_PROVIDER_SITE_OTHER): Payer: Self-pay | Admitting: Nurse Practitioner

## 2023-06-22 ENCOUNTER — Ambulatory Visit (INDEPENDENT_AMBULATORY_CARE_PROVIDER_SITE_OTHER): Payer: 59 | Admitting: Vascular Surgery

## 2023-06-22 ENCOUNTER — Encounter (INDEPENDENT_AMBULATORY_CARE_PROVIDER_SITE_OTHER): Payer: 59

## 2023-07-20 ENCOUNTER — Encounter (INDEPENDENT_AMBULATORY_CARE_PROVIDER_SITE_OTHER): Payer: Self-pay

## 2023-07-25 NOTE — Progress Notes (Unsigned)
 Virtual Visit via Video Note  I connected with Kristie Dorsey on 07/30/23 at 10:30 AM EDT by a video enabled telemedicine application and verified that I am speaking with the correct person using two identifiers.  Location: Patient: home Provider: home office Persons participated in the visit- patient, provider    I discussed the limitations of evaluation and management by telemedicine and the availability of in person appointments. The patient expressed understanding and agreed to proceed.   I discussed the assessment and treatment plan with the patient. The patient was provided an opportunity to ask questions and all were answered. The patient agreed with the plan and demonstrated an understanding of the instructions.   The patient was advised to call back or seek an in-person evaluation if the symptoms worsen or if the condition fails to improve as anticipated.    Todd Fossa, MD      [facility] MD/PA/NP OP Progress Note  07/30/2023 11:03 AM Kristie Dorsey  MRN:  [id number]  Chief Complaint:  Chief Complaint  Patient presents with   Follow-up   HPI:  This is a follow-up appointment for depression, anxiety and insomnia.  Her son was walking nearby during the visit.  She agrees to proceed with the visit.   She states that she has been doing well.  She received injection of knee, which has been helping.  She has been working more.  She is relates that it helps her to regain confidence and sense of agency.  She enjoys watching TV.  She reports good relationship with her husband, which gives her a new outlook.  She enjoys taking care of her grandchildren.  She usually picks them up from school.  Her grandson will be graduating from elementary school, and her granddaughter will run a marathon.  She feels excited with days.  Although she may feel a little down when it rains, she is able to get herself out of it.  She feels like she has started to be back to herself.  Although she may feel  aggravated at times, she does not feel angry anymore, stating that she has learned to put things in perspective.  It was a little amplified when she was depressed, but not feels that way anymore.  She has fair appetite.  She has craving for sugar, and has been trying to be attentive to this.  She has been on ozempic.  She denies feeling depressed.  She feels less anxious.  She denies SI.  She sleeps very well with trazodone .  She agrees to stay on the current medication regimen at this time.   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 Both of her biological parents abused alcohol. Her mother was using alcohol when she was born  Visit Diagnosis:    ICD-10-CM   1. MDD (major depressive disorder), recurrent, in partial remission (HCC)  F33.41     2. Anxiety disorder, unspecified type  F41.9     3.  Insomnia, unspecified type  G47.00       Past Psychiatric History: Please see initial evaluation for full details. I have reviewed the history. No updates at this time.     Past Medical History:  Past Medical History:  Diagnosis Date   Abnormal uterine bleeding due to endometrial polyp    Anemia    Anginal pain (HCC)    Aortic atherosclerosis (HCC)    Arthritis    Cardiomyopathy (HCC)    a.) TTE 03/25/2018: EF 30-35%; b.) TTE 08/03/2019: EF 30%; c.) TTE 01/15/2020: EF 35%; d.) TTE 12/31/2020: EF 30%; e.) TTE 08/20/2021: EF 25%   Carotid stenosis    a.) s/p RIGHT CEA 06/20/2020   CHF (congestive heart  failure) (HCC)    a.) TTE 03/25/18: EF 30-35%, diff HK, mild MR, sev AS (MPG 39), G1DD; b.) TTE 08/03/19: EF 30%, mild LVH, glob HK, mod LAE, mild MR, triv TR; c.) TTE 01/15/20: EF 35%, mild LVH, basal-apical/antsep/infsep HK, mild LAE, triv TR/PR, mod MR, G1DD; d.) TTE 12/31/20: EF 30%, mod glob HK, mod LAE, triv TR/PR, mild MR, G1DD; e.) TTE 08/20/21: EF 25%, mod MVE/LAE, glob HK, septal AK, MAC, triv TR, mild MR   Complication of anesthesia    a.) delayed emergence following AVR   COPD (chronic obstructive pulmonary disease) (HCC)    Coronary artery disease    Depression    Dyspnea    Hypertension    Hypothyroidism    LBBB (left bundle branch block)    Morbid obesity (HCC)    MRSA nasal colonization 06/20/2020   Nonrheumatic aortic (valve) stenosis    a.) TTE 03/25/2018: EF 30-35, sev AS (MPG 39 mmHg); b.) 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; 23 mm Edwards SAPIEN 3   Obesity hypoventilation syndrome (HCC)    On supplemental oxygen therapy    a.) 2-3 L/[place] as needed   OSA treated with BiPAP    T2DM (type 2 diabetes mellitus) (HCC)     Past Surgical History:  Procedure Laterality Date   CHOLECYSTECTOMY     DILATATION & CURETTAGE/HYSTEROSCOPY WITH MYOSURE N/A 09/25/2021   Procedure: FRACTIONAL DILATATION & CURETTAGE/HYSTEROSCOPY;  Surgeon: Schermerhorn, Joselyn Nicely, MD;  Location: [facility];  Service: Gynecology;  Laterality: N/A;   ENDARTERECTOMY Right 06/20/2020   Procedure: ENDARTERECTOMY CAROTID;  Surgeon: Celso College, MD;  Location: [facility];  Service: Vascular;  Laterality: Right;   HYSTEROSCOPY WITH D & C N/A 02/28/2020   Procedure: DILATATION AND CURETTAGE;  Surgeon: Ward, Margarie Shay, MD;  Location: [facility];  Service: Gynecology;  Laterality: N/A;   LEFT HEART CATH AND CORONARY ANGIOGRAPHY N/A 04/14/2018   Procedure: LEFT HEART CATH AND CORONARY ANGIOGRAPHY;  Surgeon: Antonette Batters, MD;  Location: [facility];  Service: Cardiovascular;  Laterality: N/A;   TONSILLECTOMY      TRANSCATHETER AORTIC VALVE REPLACEMENT, TRANSAORTIC Right 11/01/2018   Procedure: TRANSCATHETER AORTIC VALVE REPLACEMENT, TRANSAORTIC via a RIGHT FEMORAL APPROACH; Location: [facility]; Surgeon: Hellen Lob, MD   TUBAL LIGATION      Family Psychiatric History: Please see initial evaluation for full details. I have reviewed the history. No updates at this time.     Family History:  Family History  Adopted: Yes  Problem Relation Age of Onset   Alcohol abuse Mother    Aneurysm Mother    Alcohol abuse Father    Cancer Father    Depression Daughter    ADD / ADHD Son    Bipolar disorder Son  Social History:  Social History   Socioeconomic History   Marital status: Married    Spouse name: eugene   Number of children: 2   Years of education: Not on file   Highest education level: Not on file  Occupational History   Occupation: Designer, multimedia    Comment: disability  Tobacco Use   Smoking status: Every Day    Current packs/day: 1.50    Average packs/day: 1.5 packs/day for 50.4 years (75.6 ttl pk-yrs)    Types: Cigarettes    Start date: 03/02/1973   Smokeless tobacco: Never   Tobacco comments:    Smokes 0.5 PPD khj 04/20/2023        Started smoking at 63 yrs old    Smoked 2 PPD at her heaviest.  Vaping Use   Vaping status: Some Days   Start date: 11/01/2018   Substances: Nicotine   Devices: trying to quit  Substance and Sexual Activity   Alcohol use: No   Drug use: No   Sexual activity: Not Currently  Other Topics Concern   Not on file  Social History Narrative   Patient lives with husband and 2 daughters. Has dogs.   Patient feels safe in her home.   She still vapes on a daily basis. Nicotine in her liquid.   Social Drivers of Corporate investment banker Strain: Not on file  Food Insecurity: Food Insecurity Present (11/02/2018)   [facility], [facility]   Hunger Vital Sign    Worried About Running Out of Food in the Last Year: Sometimes true    Ran  Out of Food in the Last Year: Sometimes true  Transportation Needs: Not on file  Physical Activity: Not on file  Stress: Not on file  Social Connections: Not on file    Allergies: No Known Allergies  Metabolic Disorder Labs: No results found for: "HGBA1C", "MPG" No results found for: "PROLACTIN" No results found for: "CHOL", "TRIG", "HDL", "CHOLHDL", "VLDL", "LDLCALC" No results found for: "TSH"  Therapeutic Level Labs: No results found for: "LITHIUM" No results found for: "VALPROATE" No results found for: "CBMZ"  Current Medications: Current Outpatient Medications  Medication Sig Dispense Refill   acetaminophen  (TYLENOL ) 325 MG tablet Take by mouth.     albuterol  (VENTOLIN  HFA) 108 (90 Base) MCG/ACT inhaler Inhale 2 puffs into the lungs every 6 (six) hours as needed for wheezing or shortness of breath. 8 g 2   atorvastatin  (LIPITOR) 10 MG tablet TAKE 1 TABLET BY MOUTH ONCE DAILY. 90 tablet 0   carvedilol (COREG) 6.25 MG tablet Take 6.25 mg by mouth daily.     dapagliflozin propanediol (FARXIGA) 10 MG TABS tablet Take 10 mg by mouth daily.     ENTRESTO 97-103 MG Take 1 tablet by mouth 2 (two) times daily.     ferrous sulfate 325 (65 FE) MG tablet Take 325 mg by mouth daily with breakfast.     fluticasone (FLONASE) 50 MCG/ACT nasal spray Place into both nostrils.     furosemide  (LASIX ) 40 MG tablet Take 40 mg by mouth daily.      levothyroxine  (SYNTHROID , LEVOTHROID) 75 MCG tablet Take 75 mcg by mouth daily before breakfast.      meloxicam (MOBIC) 15 MG tablet Take 15 mg by mouth daily.     mometasone -formoterol  (DULERA ) 200-5 MCG/ACT AERO Inhale 2 puffs into the lungs 2 (two) times daily. 1 each 11   Multiple Vitamins-Minerals (MULTIVITAMIN WITH MINERALS) tablet Take 1 tablet by  mouth daily.     OXYGEN Inhale 2-3 L/min into the lungs as needed (dyspnea, COPD related symptoms, obsesity related hypoventilation syndrome).     OZEMPIC, 0.25 OR 0.5 MG/DOSE, 2 MG/1.5ML SOPN Inject into  the skin.     OZEMPIC, 2 MG/DOSE, 8 MG/3ML SOPN Inject into the skin.     progesterone (PROMETRIUM) 200 MG capsule Take 200 mg by mouth at bedtime.     Spacer/Aero-Holding Chambers (AEROCHAMBER MV) inhaler Use as instructed 1 each 0   spironolactone  (ALDACTONE ) 25 MG tablet Take 25 mg by mouth daily.     Tiotropium Bromide  Monohydrate (SPIRIVA  RESPIMAT) 2.5 MCG/ACT AERS Inhale 2 puffs into the lungs daily. 4 g 11   traMADol (ULTRAM) 50 MG tablet Take 50 mg by mouth daily as needed.     traZODone  (DESYREL ) 50 MG tablet Take 3 tablets (150 mg total) by mouth at bedtime. 90 tablet 3   venlafaxine  XR (EFFEXOR -XR) 150 MG 24 hr capsule Take 1 capsule (150 mg total) by mouth daily with breakfast. 30 capsule 3   [START ON 08/16/2023] venlafaxine  XR (EFFEXOR -XR) 75 MG 24 hr capsule Take 1 capsule (75 mg total) by mouth daily with breakfast. Take total of 225 mg daily, take along with 150 mg cap 30 capsule 3   No current facility-administered medications for this visit.     Musculoskeletal: Strength & Muscle Tone: N/A Gait & Station: N/A Patient leans: N/A  Psychiatric Specialty Exam: Review of Systems  Psychiatric/Behavioral: Negative.    All other systems reviewed and are negative.   Last menstrual period 02/21/2020.There is no height or weight on file to calculate BMI.  General Appearance: Well Groomed  Eye Contact:  Good  Speech:  Clear and Coherent  Volume:  Normal  Mood:  good  Affect:  Appropriate, Congruent, and Full Range  Thought Process:  Coherent  Orientation:  Full (Time, Place, and Person)  Thought Content: Logical   Suicidal Thoughts:  No  Homicidal Thoughts:  No  Memory:  Immediate;   Good  Judgement:  Good  Insight:  Good  Psychomotor Activity:  Normal  Concentration:  Concentration: Good and Attention Span: Good  Recall:  Good  Fund of Knowledge: Good  Language: Good  Akathisia:  No  Handed:  Right  AIMS (if indicated): not done  Assets:  Communication  Skills Desire for Improvement  ADL's:  Intact  Cognition: WNL  Sleep:  Good   Screenings: GAD-7    Flowsheet Row Office Visit from 06/17/2023 in [facility] Office Visit from 04/02/2022 in [facility]  Total GAD-7 Score 4 4      PHQ2-9    Flowsheet Row Office Visit from 06/17/2023 in [facility] Counselor from 11/10/2022 in [facility] at [facility] Office Visit from 03/07/1094 in [facility]  PHQ-2 Total Score 2 4 2   PHQ-9 Total Score 5 12 9       Flowsheet Row Office Visit from 04/02/2022 in [facility] Admission (Discharged) from 09/25/2021 in [facility] Pre-Admission Testing 44 from 09/18/2021 in [facility] PRE [facility]MISSION TEST[facility]  C-SSRS RISK CATEGORY No Risk No Risk No Risk        Assessment and Plan:  Kristie Dorsey is a 63 y.o. year old female with a history of COPD, chronic respiratory failure on home oxygen, OSA, chronic  systolic CHF (EF 21%, NYHA III), s/p ICD, s/p TAVR, hypothyroidism, who is referred for depression.   1. MDD (major depressive disorder), recurrent, in partial remission (HCC) 2. Anxiety disorder, unspecified type Acute stressors include: Conflict with her son at home, feeling overcrowded in the house, knee pain Other stressors include: Adoption at age 64 (both of her parents were abusing alcohol including the time she was born), unemployment (on disability), arthritis History: depression since age 63, originally on sertraline 100 mg daily, rexulti  0.5 mg daily, trazodone  150 mg qhs   The exam is notable for bright affect, and there has been steady improvement in depressive symptoms and anxiety since cross tapering from sertraline to venlafaxine .  She reports a good  connection with her family and has been able to walk more since receiving a knee injection, which has helped restore her sense of agency.  Will continue current dose of venlafaxine  to target depression and anxiety.  Provided psychoeducation about behavioral activation.  She is willing to continue working on increasing her walking as her condition improves.   3. Insomnia, unspecified type - uses CPAP machine regularly    Stable.  Will continue trazodone  as needed for insomnia .    Plan Continue venlafaxine  225 mg daily  Continue Trazodone  150 mg at night as needed for insomnia Next appointment: 7/11 at 11 30, video Dicussed attendance policy - on Ozempic QTc HR 80, 470 msec 06/2022)   Past trials- trazodone , buspar, rexulti /discontinued to mitigate its risk of weight gain, ambien ,    The patient demonstrates the following risk factors for suicide: Chronic risk factors for suicide include: psychiatric disorder of depression . Acute risk factors for suicide include: family or marital conflict and unemployment. Protective factors for this patient include: positive social support. Considering these factors, the overall suicide risk at this point appears to be low. Patient is appropriate for outpatient follow up.   Collaboration of Care: Collaboration of Care: Other reviewed notes in Epic  Patient/Guardian was advised Release of Information must be obtained prior to any record release in order to collaborate their care with an outside provider. Patient/Guardian was advised if they have not already done so to contact the registration department to sign all necessary forms in order for us  to release information regarding their care.   Consent: Patient/Guardian gives verbal consent for treatment and assignment of benefits for services provided during this visit. Patient/Guardian expressed understanding and agreed to proceed.    Todd Fossa, MD 07/30/2023, 11:03 AM

## 2023-07-30 ENCOUNTER — Telehealth: Admitting: Psychiatry

## 2023-07-30 ENCOUNTER — Encounter: Payer: Self-pay | Admitting: Psychiatry

## 2023-07-30 DIAGNOSIS — F419 Anxiety disorder, unspecified: Secondary | ICD-10-CM | POA: Diagnosis not present

## 2023-07-30 DIAGNOSIS — G4700 Insomnia, unspecified: Secondary | ICD-10-CM

## 2023-07-30 DIAGNOSIS — F3341 Major depressive disorder, recurrent, in partial remission: Secondary | ICD-10-CM

## 2023-07-30 MED ORDER — VENLAFAXINE HCL ER 75 MG PO CP24: 75.0000 mg | ORAL_CAPSULE | Freq: Every day | ORAL | 3 refills | Status: DC

## 2023-07-30 NOTE — Patient Instructions (Signed)
 Continue venlafaxine  225 mg daily  Continue Trazodone  150 mg at night as needed for insomnia Next appointment: 7/11 at 11 30

## 2023-08-13 ENCOUNTER — Other Ambulatory Visit (INDEPENDENT_AMBULATORY_CARE_PROVIDER_SITE_OTHER): Payer: Self-pay | Admitting: Vascular Surgery

## 2023-08-13 DIAGNOSIS — I6523 Occlusion and stenosis of bilateral carotid arteries: Secondary | ICD-10-CM

## 2023-08-17 ENCOUNTER — Ambulatory Visit (INDEPENDENT_AMBULATORY_CARE_PROVIDER_SITE_OTHER): Admitting: Vascular Surgery

## 2023-08-17 ENCOUNTER — Encounter (INDEPENDENT_AMBULATORY_CARE_PROVIDER_SITE_OTHER)

## 2023-09-05 NOTE — Progress Notes (Signed)
 Virtual Visit via Video Note  I connected with Kristie Dorsey on 09/10/23 at 11:30 AM EDT by a video enabled telemedicine application and verified that I am speaking with the correct person using two identifiers.  Location: Patient: outside Provider: home office Persons participated in the visit- patient, provider    I discussed the limitations of evaluation and management by telemedicine and the availability of in person appointments. The patient expressed understanding and agreed to proceed.    I discussed the assessment and treatment plan with the patient. The patient was provided an opportunity to ask questions and all were answered. The patient agreed with the plan and demonstrated an understanding of the instructions.   The patient was advised to call back or seek an in-person evaluation if the symptoms worsen or if the condition fails to improve as anticipated.    [name], MD      [facility] MD/PA/NP OP Progress Note  09/10/2023 12:08 PM Kristie Dorsey  MRN:  [id number]  Chief Complaint:  Chief Complaint  Patient presents with   Follow-up   HPI:  This is a follow-up appointment for depression, anxiety and insomnia.  She states that she has some days of not feeling good.  She may be feeling irritable or anxiety.  She feels overwhelmed at times.  She is unsure of the triggers, stating that it may be due to things going on in the world.  She states that she is doing okay.  However, she becomes tearful, stating that she tends to be nervous.  Although she thought the relationship with her husband was getting better, it was not.  It has been the same old same old all the time.  She feels that he does not pay good attention to her, and she is getting used to it.  She feels tired of trying.  She tends to think about her parents.  She used to talk with her mother about anything.  She enjoys time with her grandchildren.  She sleeps up to 8 hours.  She occasionally feels un refreshed.   She denies SI, HI, hallucinations. She denies alcohol use, drug use.  She agrees with the plans as outlined below.    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 Both of her biological parents abused alcohol. Her mother was using alcohol when she was born  Visit Diagnosis:    ICD-10-CM   1. MDD (major depressive disorder), recurrent episode, moderate (HCC)  F33.1     2. Anxiety disorder, unspecified type  F41.9     3. Insomnia, unspecified type  G47.00       Past Psychiatric History: Please see initial evaluation for full details. I have reviewed the history. No updates at this time.     Past Medical History:  Past Medical History:  Diagnosis Date   Abnormal uterine bleeding due to endometrial polyp    Anemia    Anginal pain (HCC)    Aortic atherosclerosis (HCC)    Arthritis    Cardiomyopathy (HCC)    a.) TTE 03/25/2018: EF 30-35%; b.) TTE  08/03/2019: EF 30%; c.) TTE 01/15/2020: EF 35%; d.) TTE 12/31/2020: EF 30%; e.) TTE 08/20/2021: EF 25%   Carotid stenosis    a.) s/p RIGHT CEA 06/20/2020   CHF (congestive heart failure) (HCC)    a.) TTE 03/25/18: EF 30-35%, diff HK, mild MR, sev AS (MPG 39), G1DD; b.) TTE 08/03/19: EF 30%, mild LVH, glob HK, mod LAE, mild MR, triv TR; c.) TTE 01/15/20: EF 35%, mild LVH, basal-apical/antsep/infsep HK, mild LAE, triv TR/PR, mod MR, G1DD; d.) TTE 12/31/20: EF 30%, mod glob HK, mod LAE, triv TR/PR, mild MR, G1DD; e.) TTE 08/20/21: EF 25%, mod MVE/LAE, glob HK, septal AK, MAC, triv TR, mild MR   Complication of anesthesia    a.) delayed emergence  following AVR   COPD (chronic obstructive pulmonary disease) (HCC)    Coronary artery disease    Depression    Dyspnea    Hypertension    Hypothyroidism    LBBB (left bundle branch block)    Morbid obesity (HCC)    MRSA nasal colonization 06/20/2020   Nonrheumatic aortic (valve) stenosis    a.) TTE 03/25/2018: EF 30-35, sev AS (MPG 39 mmHg); b.) 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; 23 mm Edwards SAPIEN 3   Obesity hypoventilation syndrome (HCC)    On supplemental oxygen therapy    a.) 2-3 L/[place] as needed   OSA treated with BiPAP    T2DM (type 2 diabetes mellitus) (HCC)     Past Surgical History:  Procedure Laterality Date   CHOLECYSTECTOMY     DILATATION & CURETTAGE/HYSTEROSCOPY WITH MYOSURE N/A 09/25/2021   Procedure: FRACTIONAL DILATATION & CURETTAGE/HYSTEROSCOPY;  Surgeon: Schermerhorn, Debby PARAS, MD;  Location: [facility];  Service: Gynecology;  Laterality: N/A;   ENDARTERECTOMY Right 06/20/2020   Procedure: ENDARTERECTOMY CAROTID;  Surgeon: Marea Selinda RAMAN, MD;  Location: [facility];  Service: Vascular;  Laterality: Right;   HYSTEROSCOPY WITH D & C N/A 02/28/2020   Procedure: DILATATION AND CURETTAGE;  Surgeon: Ward, Mitzie BROCKS, MD;  Location: [facility];  Service: Gynecology;  Laterality: N/A;   LEFT HEART CATH AND CORONARY ANGIOGRAPHY N/A 04/14/2018   Procedure: LEFT HEART CATH AND CORONARY ANGIOGRAPHY;  Surgeon: Florencio Cara BIRCH, MD;  Location: [facility];  Service: Cardiovascular;  Laterality: N/A;   TONSILLECTOMY     TRANSCATHETER AORTIC VALVE REPLACEMENT, TRANSAORTIC Right 11/01/2018   Procedure: TRANSCATHETER AORTIC VALVE REPLACEMENT, TRANSAORTIC via a RIGHT FEMORAL APPROACH; Location: [facility]; Surgeon: Norleen Grumbling, MD   TUBAL LIGATION      Family Psychiatric History: Please see initial evaluation for full details. I have reviewed the history. No updates at this time.     Family History:  Family History  Adopted: Yes  Problem Relation Age of Onset   Alcohol abuse Mother    Aneurysm  Mother    Alcohol abuse Father    Cancer Father    Depression Daughter    ADD / ADHD Son    Bipolar disorder Son     Social History:  Social History   Socioeconomic History   Marital status: Married    Spouse name: eugene   Number of children: 2   Years of education: Not on file   Highest education level: Not on file  Occupational History   Occupation: Designer, multimedia    Comment: disability  Tobacco Use   Smoking status: Every Day    Current packs/day: 1.50    Average packs/day: 1.5 packs/day for 50.5 years (75.8 ttl pk-yrs)    Types: Cigarettes  Start date: 03/02/1973   Smokeless tobacco: Never   Tobacco comments:    Smokes 0.5 PPD khj 04/20/2023        Started smoking at 63 yrs old    Smoked 2 PPD at her heaviest.  Vaping Use   Vaping status: Some Days   Start date: 11/01/2018   Substances: Nicotine   Devices: trying to quit  Substance and Sexual Activity   Alcohol use: No   Drug use: No   Sexual activity: Not Currently  Other Topics Concern   Not on file  Social History Narrative   Patient lives with husband and 2 daughters. Has dogs.   Patient feels safe in her home.   She still vapes on a daily basis. Nicotine in her liquid.   Social Drivers of Corporate investment banker Strain: Not on file  Food Insecurity: Food Insecurity Present (11/02/2018)   Received from [facility]   Hunger Vital Sign    Within the past 12 months, you worried that your food would run out before you got the money to buy more.: Sometimes true    Within the past 12 months, the food you bought just didn't last and you didn't have money to get more.: Sometimes true  Transportation Needs: Not on file  Physical Activity: Not on file  Stress: Not on file  Social Connections: Not on file    Allergies: No Known Allergies  Metabolic Disorder Labs: No results found for: HGBA1C, MPG No results found for: PROLACTIN No results found for: CHOL, TRIG, HDL, CHOLHDL, VLDL,  LDLCALC No results found for: TSH  Therapeutic Level Labs: No results found for: LITHIUM No results found for: VALPROATE No results found for: CBMZ  Current Medications: Current Outpatient Medications  Medication Sig Dispense Refill   busPIRone  (BUSPAR ) 5 MG tablet Take 1 tablet (5 mg total) by mouth 2 (two) times daily. 60 tablet 1   acetaminophen  (TYLENOL ) 325 MG tablet Take by mouth.     albuterol  (VENTOLIN  HFA) 108 (90 Base) MCG/ACT inhaler Inhale 2 puffs into the lungs every 6 (six) hours as needed for wheezing or shortness of breath. 8 g 2   atorvastatin  (LIPITOR) 10 MG tablet TAKE 1 TABLET BY MOUTH ONCE DAILY. 90 tablet 0   carvedilol (COREG) 6.25 MG tablet Take 6.25 mg by mouth daily.     dapagliflozin propanediol (FARXIGA) 10 MG TABS tablet Take 10 mg by mouth daily.     ENTRESTO 97-103 MG Take 1 tablet by mouth 2 (two) times daily.     ferrous sulfate 325 (65 FE) MG tablet Take 325 mg by mouth daily with breakfast.     fluticasone (FLONASE) 50 MCG/ACT nasal spray Place into both nostrils.     furosemide  (LASIX ) 40 MG tablet Take 40 mg by mouth daily.      levothyroxine  (SYNTHROID , LEVOTHROID) 75 MCG tablet Take 75 mcg by mouth daily before breakfast.      meloxicam (MOBIC) 15 MG tablet Take 15 mg by mouth daily.     mometasone -formoterol  (DULERA ) 200-5 MCG/ACT AERO Inhale 2 puffs into the lungs 2 (two) times daily. 1 each 11   Multiple Vitamins-Minerals (MULTIVITAMIN WITH MINERALS) tablet Take 1 tablet by mouth daily.     OXYGEN Inhale 2-3 L/min into the lungs as needed (dyspnea, COPD related symptoms, obsesity related hypoventilation syndrome).     OZEMPIC, 0.25 OR 0.5 MG/DOSE, 2 MG/1.5ML SOPN Inject into the skin.     OZEMPIC, 2 MG/DOSE, 8 MG/3ML  SOPN Inject into the skin.     progesterone (PROMETRIUM) 200 MG capsule Take 200 mg by mouth at bedtime.     Spacer/Aero-Holding Chambers (AEROCHAMBER MV) inhaler Use as instructed 1 each 0   spironolactone  (ALDACTONE ) 25  MG tablet Take 25 mg by mouth daily.     Tiotropium Bromide  Monohydrate (SPIRIVA  RESPIMAT) 2.5 MCG/ACT AERS Inhale 2 puffs into the lungs daily. 4 g 11   traMADol (ULTRAM) 50 MG tablet Take 50 mg by mouth daily as needed.     traZODone  (DESYREL ) 50 MG tablet Take 3 tablets (150 mg total) by mouth at bedtime. 90 tablet 3   venlafaxine  XR (EFFEXOR -XR) 150 MG 24 hr capsule Take 1 capsule (150 mg total) by mouth daily with breakfast. 30 capsule 3   venlafaxine  XR (EFFEXOR -XR) 75 MG 24 hr capsule Take 1 capsule (75 mg total) by mouth daily with breakfast. Take total of 225 mg daily, take along with 150 mg cap 30 capsule 3   No current facility-administered medications for this visit.     Musculoskeletal: Strength & Muscle Tone: N/A Gait & Station: N/A Patient leans: N/A  Psychiatric Specialty Exam: Review of Systems  Psychiatric/Behavioral:  Positive for dysphoric mood and sleep disturbance. Negative for agitation, behavioral problems, confusion, decreased concentration, hallucinations, self-injury and suicidal ideas. The patient is nervous/anxious. The patient is not hyperactive.   All other systems reviewed and are negative.   Last menstrual period 02/21/2020.There is no height or weight on file to calculate BMI.  General Appearance: Well Groomed  Eye Contact:  good  Speech:  Clear and Coherent  Volume:  Normal  Mood:  Anxious and Depressed  Affect:  Appropriate, Congruent, and Tearful  Thought Process:  Coherent  Orientation:  Full (Time, Place, and Person)  Thought Content: Logical   Suicidal Thoughts:  No  Homicidal Thoughts:  No  Memory:  Immediate;   Good  Judgement:  Good  Insight:  Good  Psychomotor Activity:  Normal  Concentration:  Concentration: Good and Attention Span: Good  Recall:  Good  Fund of Knowledge: Good  Language: Good  Akathisia:  No  Handed:  Right  AIMS (if indicated): not done  Assets:  Communication Skills Desire for Improvement  ADL's:  Intact   Cognition: WNL  Sleep:  Fair   Screenings: GAD-7    Flowsheet Row Office Visit from 06/17/2023 in [facility] Office Visit from 04/02/2022 in [facility]  Total GAD-7 Score 4 4   PHQ2-9    Flowsheet Row Office Visit from 06/17/2023 in [facility] Counselor from 11/10/2022 in [facility] Office Visit from 09/06/7973 in [facility]  PHQ-2 Total Score 2 4 2   PHQ-9 Total Score 5 12 9    Flowsheet Row Office Visit from 04/02/2022 in [facility] Admission (Discharged) from 09/25/2021 in [facility] Pre-Admission Testing 44 from 09/18/2021 in [facility] PRE ADMISSION TESTING  C-SSRS RISK CATEGORY No Risk No Risk No Risk     Assessment and Plan:  Kristie Dorsey is a 63 y.o. year old female with a history of COPD, chronic respiratory failure on home oxygen, OSA, chronic systolic CHF (EF 74%, NYHA III), s/p ICD, s/p TAVR, hypothyroidism, who presents for follow up for below.  1. MDD (major depressive disorder), recurrent episode, moderate (HCC) 2. Anxiety disorder,  unspecified type The patient reports a history of knee pain and prenatal alcohol exposure, as her mother was drinking during pregnancy. Both of her biological parents had histories of alcohol abuse. Socially, she was adopted at age 35, and she currently reports difficulty with her son, who lacks boundaries. History: depression since age 54, originally on sertraline 100 mg daily, rexulti  0.5 mg daily, trazodone  150 mg qhs    The exam is notable for tearfulness, and she reports significant worsening in depressive symptoms, and anxiety in the context of conflict with her husband, while she continues to enjoy taking care of her grandchildren.   Given she reports good benefit from cross tapering from sertraline to venlafaxine , will maintain on the current medication.  Will start BuSpar  to target anxiety.  Discussed potential risk of headache, drowsiness.  Will consider adjunctive treatment for depression if she has limited benefit from this medication; buspirone  was chosen this time given her significant medical comorbidity/to avoid any metabolic side effect.  She will greatly benefit from CBT; will make referral.   3. Insomnia, unspecified type - uses CPAP machine regularly     Relatively stable with few times of unrestored sleep.  Will continue trazodone  as needed for insomnia.    Plan Continue venlafaxine  225 mg daily  Start Buspar  5 mg twice a day Continue Trazodone  150 mg at night as needed for insomnia Next appointment: 8/20 at 9 am, video Referral for therapy Dicussed attendance policy - on Ozempic QTc HR 80, 470 msec 06/2022   Past trials- trazodone , buspar , rexulti /discontinued to mitigate its risk of weight gain, ambien ,    The patient demonstrates the following risk factors for suicide: Chronic risk factors for suicide include: psychiatric disorder of depression . Acute risk factors for suicide include: family or marital conflict and unemployment. Protective factors for this patient include: positive social support. Considering these factors, the overall suicide risk at this point appears to be low. Patient is appropriate for outpatient follow up.   Collaboration of Care: Collaboration of Care: Other reviewed notes in Epic  Patient/Guardian was advised Release of Information must be obtained prior to any record release in order to collaborate their care with an outside provider. Patient/Guardian was advised if they have not already done so to contact the registration department to sign all necessary forms in order for us  to release information regarding their care.   Consent: Patient/Guardian gives verbal consent for  treatment and assignment of benefits for services provided during this visit. Patient/Guardian expressed understanding and agreed to proceed.    [name], MD 09/10/2023, 12:08 PM

## 2023-09-10 ENCOUNTER — Encounter: Payer: Self-pay | Admitting: Psychiatry

## 2023-09-10 ENCOUNTER — Telehealth (INDEPENDENT_AMBULATORY_CARE_PROVIDER_SITE_OTHER): Admitting: Psychiatry

## 2023-09-10 DIAGNOSIS — F419 Anxiety disorder, unspecified: Secondary | ICD-10-CM | POA: Diagnosis not present

## 2023-09-10 DIAGNOSIS — G4700 Insomnia, unspecified: Secondary | ICD-10-CM

## 2023-09-10 DIAGNOSIS — F331 Major depressive disorder, recurrent, moderate: Secondary | ICD-10-CM | POA: Diagnosis not present

## 2023-09-10 MED ORDER — BUSPIRONE HCL 5 MG PO TABS: 5.0000 mg | ORAL_TABLET | Freq: Two times a day (BID) | ORAL | 1 refills | Status: DC

## 2023-09-20 ENCOUNTER — Other Ambulatory Visit (INDEPENDENT_AMBULATORY_CARE_PROVIDER_SITE_OTHER): Payer: Self-pay | Admitting: Nurse Practitioner

## 2023-09-27 ENCOUNTER — Other Ambulatory Visit (INDEPENDENT_AMBULATORY_CARE_PROVIDER_SITE_OTHER): Payer: Self-pay | Admitting: Nurse Practitioner

## 2023-10-12 NOTE — Progress Notes (Unsigned)
 Virtual Visit via Video Note  I connected with [name] on 10/20/23 at  9:00 AM EDT by a video enabled telemedicine application and verified that I am speaking with the correct person using two identifiers.  Location: Patient: home Provider: home office Persons participated in the visit- patient, provider    I discussed the limitations of evaluation and management by telemedicine and the availability of in person appointments. The patient expressed understanding and agreed to proceed.     I discussed the assessment and treatment plan with the patient. The patient was provided an opportunity to ask questions and all were answered. The patient agreed with the plan and demonstrated an understanding of the instructions.   The patient was advised to call back or seek an in-person evaluation if the symptoms worsen or if the condition fails to improve as anticipated.  [name], MD    [facility] MD/PA/NP OP Progress Note  10/20/2023 9:33 AM Kristie Dorsey  MRN:  [id number]  Chief Complaint:  Chief Complaint  Patient presents with   Follow-up   HPI:  This is a follow-up appointment for depression, anxiety and insomnia.  She states that she could not continue BuSpar  as she feels sick.  She now remembers that she had the same reaction when she tried in the past.  She states that the relationship with her husband has been getting better.  She was able to conversation with them about how she has been feeling.  He was receptive to this, and is willing to work on the marriage.  She has been feeling nervous and edgy.  She has thoughts that things are not going anywhere.  This is also exacerbated due to knee pain.  She has been trying to work on her weight.  She is trying to walk although she has some issues with knee.  She is also working on diet.  She is also trying to quit smoking.  Although she did try Wellbutrin  in the past, it was discontinued by her provider.  She is willing to reach out to  the provider if this can be resumed.  She denies SI, HI, hallucinations.  She agrees with the plans as outlined below.   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 Both of her biological parents abused alcohol. Her mother was using alcohol when she was born  Visit Diagnosis:    ICD-10-CM   1. MDD (major depressive disorder), recurrent episode, mild (HCC)  F33.0     2. Anxiety disorder, unspecified type  F41.9     3. Insomnia, unspecified type  G47.00       Past Psychiatric History: Please see initial evaluation for full details. I have reviewed the history. No updates at this time.     Past Medical History:  Past Medical History:  Diagnosis Date   Abnormal uterine bleeding due to endometrial polyp  Anemia    Anginal pain (HCC)    Aortic atherosclerosis (HCC)    Arthritis    Cardiomyopathy (HCC)    a.) TTE 03/25/2018: EF 30-35%; b.) TTE 08/03/2019: EF 30%; c.) TTE 01/15/2020: EF 35%; d.) TTE 12/31/2020: EF 30%; e.) TTE 08/20/2021: EF 25%   Carotid stenosis    a.) s/p RIGHT CEA 06/20/2020   CHF (congestive heart failure) (HCC)    a.) TTE 03/25/18: EF 30-35%, diff HK, mild MR, sev AS (MPG 39), G1DD; b.) TTE 08/03/19: EF 30%, mild LVH, glob HK, mod LAE, mild MR, triv TR; c.) TTE 01/15/20: EF 35%, mild LVH, basal-apical/antsep/infsep HK, mild LAE, triv TR/PR, mod MR, G1DD; d.) TTE 12/31/20: EF 30%, mod glob HK,  mod LAE, triv TR/PR, mild MR, G1DD; e.) TTE 08/20/21: EF 25%, mod MVE/LAE, glob HK, septal AK, MAC, triv TR, mild MR   Complication of anesthesia    a.) delayed emergence following AVR   COPD (chronic obstructive pulmonary disease) (HCC)    Coronary artery disease    Depression    Dyspnea    Hypertension    Hypothyroidism    LBBB (left bundle branch block)    Morbid obesity (HCC)    MRSA nasal colonization 06/20/2020   Nonrheumatic aortic (valve) stenosis    a.) TTE 03/25/2018: EF 30-35, sev AS (MPG 39 mmHg); b.) 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; 23 mm Edwards SAPIEN 3   Obesity hypoventilation syndrome (HCC)    On supplemental oxygen therapy    a.) 2-3 L/P[place]er as needed   OSA treated with BiPAP    T2DM (type 2 diabetes mellitus) (HCC)     Past Surgical History:  Procedure Laterality Date   CHOLECYSTECTOMY     DILATATION & CURETTAGE/HYSTEROSCOPY WITH MYOSURE N/A 09/25/2021   Procedure: FRACTIONAL DILATATION & CURETTAGE/HYSTEROSCOPY;  Surgeon: Schermerhorn, Debby PARAS, MD;  Location: [facility];  Service: Gynecology;  Laterality: N/A;   ENDARTERECTOMY Right 06/20/2020   Procedure: ENDARTERECTOMY CAROTID;  Surgeon: Marea Selinda RAMAN, MD;  Location: [facility];  Service: Vascular;  Laterality: Right;   HYSTEROSCOPY WITH D & C N/A 02/28/2020   Procedure: DILATATION AND CURETTAGE;  Surgeon: Ward, Mitzie BROCKS, MD;  Location: [facility];  Service: Gynecology;  Laterality: N/A;   LEFT HEART CATH AND CORONARY ANGIOGRAPHY N/A 04/14/2018   Procedure: LEFT HEART CATH AND CORONARY ANGIOGRAPHY;  Surgeon: Florencio Cara BIRCH, MD;  Location: [facility];  Service: Cardiovascular;  Laterality: N/A;   TONSILLECTOMY     TRANSCATHETER AORTIC VALVE REPLACEMENT, TRANSAORTIC Right 11/01/2018   Procedure: TRANSCATHETER AORTIC VALVE REPLACEMENT, TRANSAORTIC via a RIGHT FEMORAL APPROACH; Location: [facility]; Surgeon: Norleen Grumbling, MD   TUBAL LIGATION      Family Psychiatric History: Please see initial evaluation for full details. I  have reviewed the history. No updates at this time.     Family History:  Family History  Adopted: Yes  Problem Relation Age of Onset   Alcohol abuse Mother    Aneurysm Mother    Alcohol abuse Father    Cancer Father    Depression Daughter    ADD / ADHD Son    Bipolar disorder Son     Social History:  Social History   Socioeconomic History   Marital status: Married    Spouse name: eugene   Number of children: 2   Years of education: Not on file   Highest education level: Not on file  Occupational History   Occupation: Designer, multimedia    Comment: disability  Tobacco Use  Smoking status: Every Day    Current packs/day: 1.50    Average packs/day: 1.5 packs/day for 50.6 years (75.9 ttl pk-yrs)    Types: Cigarettes    Start date: 03/02/1973   Smokeless tobacco: Never   Tobacco comments:    Smokes 0.5 PPD khj 04/20/2023        Started smoking at 63 yrs old    Smoked 2 PPD at her heaviest.  Vaping Use   Vaping status: Some Days   Start date: 11/01/2018   Substances: Nicotine   Devices: trying to quit  Substance and Sexual Activity   Alcohol use: No   Drug use: No   Sexual activity: Not Currently  Other Topics Concern   Not on file  Social History Narrative   Patient lives with husband and 2 daughters. Has dogs.   Patient feels safe in her home.   She still vapes on a daily basis. Nicotine in her liquid.   Social Drivers of Corporate investment banker Strain: Not on file  Food Insecurity: Food Insecurity Present (11/02/2018)   [facility]   Hunger Vital Sign    Within the past 12 months, you worried that your food would run out before you got the money to buy more.: Sometimes true    Within the past 12 months, the food you bought just didn't last and you didn't have money to get more.: Sometimes true  Transportation Needs: Not on file  Physical Activity: Not on file  Stress: Not on file  Social Connections: Not on file    Allergies: No Known  Allergies  Metabolic Disorder Labs: No results found for: HGBA1C, MPG No results found for: PROLACTIN No results found for: CHOL, TRIG, HDL, CHOLHDL, VLDL, LDLCALC No results found for: TSH  Therapeutic Level Labs: No results found for: LITHIUM No results found for: VALPROATE No results found for: CBMZ  Current Medications: Current Outpatient Medications  Medication Sig Dispense Refill   pregabalin  (LYRICA ) 25 MG capsule Take 1 capsule (25 mg total) by mouth 2 (two) times daily. 60 capsule 1   acetaminophen  (TYLENOL ) 325 MG tablet Take by mouth.     albuterol  (VENTOLIN  HFA) 108 (90 Base) MCG/ACT inhaler Inhale 2 puffs into the lungs every 6 (six) hours as needed for wheezing or shortness of breath. 8 g 2   atorvastatin  (LIPITOR) 10 MG tablet TAKE 1 TABLET BY MOUTH ONCE DAILY. 90 tablet 0   carvedilol (COREG) 6.25 MG tablet Take 6.25 mg by mouth daily.     dapagliflozin propanediol (FARXIGA) 10 MG TABS tablet Take 10 mg by mouth daily.     ENTRESTO 97-103 MG Take 1 tablet by mouth 2 (two) times daily.     ferrous sulfate 325 (65 FE) MG tablet Take 325 mg by mouth daily with breakfast.     fluticasone (FLONASE) 50 MCG/ACT nasal spray Place into both nostrils.     furosemide  (LASIX ) 40 MG tablet Take 40 mg by mouth daily.      levothyroxine  (SYNTHROID , LEVOTHROID) 75 MCG tablet Take 75 mcg by mouth daily before breakfast.      meloxicam (MOBIC) 15 MG tablet Take 15 mg by mouth daily.     mometasone -formoterol  (DULERA ) 200-5 MCG/ACT AERO Inhale 2 puffs into the lungs 2 (two) times daily. 1 each 11   Multiple Vitamins-Minerals (MULTIVITAMIN WITH MINERALS) tablet Take 1 tablet by mouth daily.     OXYGEN Inhale 2-3 L/min into the lungs as needed (dyspnea, COPD  related symptoms, obsesity related hypoventilation syndrome).     OZEMPIC, 0.25 OR 0.5 MG/DOSE, 2 MG/1.5ML SOPN Inject into the skin.     OZEMPIC, 2 MG/DOSE, 8 MG/3ML SOPN Inject into the skin.      progesterone (PROMETRIUM) 200 MG capsule Take 200 mg by mouth at bedtime.     Spacer/Aero-Holding Chambers (AEROCHAMBER MV) inhaler Use as instructed 1 each 0   spironolactone (ALDACTONE) 25 MG tablet Take 25 mg by mouth daily.     Tiotropium Bromide Monohydrate (SPIRIVA RESPIMAT) 2.5 MCG/ACT AERS Inhale 2 puffs into the lungs daily. 4 g 11   traMADol (ULTRAM) 50 MG tablet Take 50 mg by mouth daily as needed.     traZODone (DESYREL) 50 MG tablet Take 3 tablets (150 mg total) by mouth at bedtime. 90 tablet 3   [START ON 10/22/2023] venlafaxine XR (EFFEXOR-XR) 150 MG 24 hr capsule Take 1 capsule (150 mg total) by mouth daily with breakfast. 30 capsule 5   venlafaxine XR (EFFEXOR-XR) 75 MG 24 hr capsule Take 1 capsule (75 mg total) by mouth daily with breakfast. Take total of 225 mg daily, take along with 150 mg cap 30 capsule 3   No current facility-administered medications for this visit.     Musculoskeletal: Strength & Muscle Tone: N/A Gait & Station: N/A Patient leans: N/A  Psychiatric Specialty Exam: Review of Systems  Psychiatric/Behavioral:  Positive for dysphoric mood and sleep disturbance. Negative for agitation, behavioral problems, confusion, decreased concentration, hallucinations, self-injury and suicidal ideas. The patient is nervous/anxious. The patient is not hyperactive.   All other systems reviewed and are negative.   Last menstrual period 02/21/2020.There is no height or weight on file to calculate BMI.  General Appearance: Well Groomed  Eye Contact:  Good  Speech:  Clear and Coherent  Volume:  Normal  Mood:  Anxious  Affect:  Appropriate, Congruent, and calm  Thought Process:  Coherent  Orientation:  Full (Time, Place, and Person)  Thought Content: Logical   Suicidal Thoughts:  No  Homicidal Thoughts:  No  Memory:  Immediate;   Good  Judgement:  Good  Insight:  Good  Psychomotor Activity:  Normal  Concentration:  Concentration: Good and Attention Span: Good   Recall:  Good  Fund of Knowledge: Good  Language: Good  Akathisia:  No  Handed:  Right  AIMS (if indicated): not done  Assets:  Communication Skills Desire for Improvement  ADL's:  Intact  Cognition: WNL  Sleep:  Fair   Screenings: GAD-7    Flowsheet Row Office Visit from 06/17/2023 in [facility] Office Visit from 04/02/2022 in [facility]  Total GAD-7 Score 4 4   PHQ2-9    Flowsheet Row Office Visit from 06/17/2023 in [facility] Counselor from 11/10/2022 in [facility] Office Visit from 09/06/7973 in [facility]  PHQ-2 Total Score 2 4 2   PHQ-9 Total Score 5 12 9    Flowsheet Row Office Visit from 04/02/2022 in [facility] Admission (Discharged) from 09/25/2021 in [facility] Pre-Admission Testing 44 from 09/18/2021 in [facility] PRE ADMISSION TESTING  C-SSRS RISK CATEGORY No Risk No Risk No Risk     Assessment and Plan:  YANELI KEITHLEY is a 63 y.o. year old female with a history of COPD, chronic respiratory failure on home oxygen, OSA,  chronic systolic CHF (EF 74%, NYHA III), s/p ICD, s/p TAVR, hypothyroidism, who presents for follow up for below.  1. MDD (major depressive disorder), recurrent episode, mild (HCC) 2. Anxiety disorder, unspecified type The patient reports a history of knee pain and prenatal alcohol exposure, as her mother was drinking during pregnancy. Both of her biological parents had histories of alcohol abuse. Socially, she was adopted at age 35, and she currently reports difficulty with her son, who lacks boundaries. History: depression since age 61, originally on sertraline 100 mg daily, rexulti 0.5 mg daily, trazodone 150 mg qhs   Although there has been  no more improvement in depressive symptoms, she continues to struggle with anxiety.  She has been working on the relationship with her husband, and continues to enjoy taking care of her grandchildren.  She had adverse reaction of sickness from BuSpar.  Will hold this medication.  Will start pregabalin to target anxiety.  Discussed potential risk of drowsiness and dependence.,  And a possible weight gain.  Will continue venlafaxine to target depression and anxiety.  She will greatly benefit from CBT; referral was made.  3. Insomnia, unspecified type - uses CPAP machine regularly      Overall stable except last night.  Will continue trazodone as needed for insomnia.     Plan Continue venlafaxine 225 mg daily  Hold Buspar Start pregabalin 25 mg twice a day Continue Trazodone 150 mg at night as needed for insomnia Next appointment: 10/10 at 11 30, video Referred for therapy - on Ozempic QTc HR 80, 470 msec 06/2022   Past trials- trazodone, Buspar (felt sick), rexulti(discontinued to mitigate its risk of weight gain), ambien,    The patient demonstrates the following risk factors for suicide: Chronic risk factors for suicide include: psychiatric disorder of depression . Acute risk factors for suicide include: family or marital conflict and unemployment. Protective factors for this patient include: positive social support. Considering these factors, the overall suicide risk at this point appears to be low. Patient is appropriate for outpatient follow up.     Collaboration of Care: Collaboration of Care: Other reviewed notes in Epic  Patient/Guardian was advised Release of Information must be obtained prior to any record release in order to collaborate their care with an outside provider. Patient/Guardian was advised if they have not already done so to contact the registration department to sign all necessary forms in order for us  to release information regarding their care.   Consent:  Patient/Guardian gives verbal consent for treatment and assignment of benefits for services provided during this visit. Patient/Guardian expressed understanding and agreed to proceed.    [name], MD 10/20/2023, 9:33 AM

## 2023-10-20 ENCOUNTER — Telehealth (INDEPENDENT_AMBULATORY_CARE_PROVIDER_SITE_OTHER): Admitting: Psychiatry

## 2023-10-20 ENCOUNTER — Encounter: Payer: Self-pay | Admitting: Psychiatry

## 2023-10-20 DIAGNOSIS — G4700 Insomnia, unspecified: Secondary | ICD-10-CM

## 2023-10-20 DIAGNOSIS — F330 Major depressive disorder, recurrent, mild: Secondary | ICD-10-CM | POA: Diagnosis not present

## 2023-10-20 DIAGNOSIS — F419 Anxiety disorder, unspecified: Secondary | ICD-10-CM

## 2023-10-20 MED ORDER — VENLAFAXINE HCL ER 150 MG PO CP24: 150.0000 mg | ORAL_CAPSULE | Freq: Every day | ORAL | 5 refills | Status: DC

## 2023-10-20 MED ORDER — PREGABALIN 25 MG PO CAPS: 25.0000 mg | ORAL_CAPSULE | Freq: Two times a day (BID) | ORAL | 1 refills | Status: DC

## 2023-10-20 NOTE — Patient Instructions (Signed)
 Continue venlafaxine  225 mg daily  Hold buspar  Start pregabalin  25 mg twice a day Continue Trazodone  150 mg at night as needed for insomnia Next appointment: 10/10 at 11 30

## 2023-10-25 ENCOUNTER — Encounter

## 2023-11-02 ENCOUNTER — Ambulatory Visit
Admission: RE | Admit: 2023-11-02 | Discharge: 2023-11-02 | Disposition: A | Discharge status: Home / Self Care | Source: Ambulatory Visit | Attending: Family Medicine | Admitting: Family Medicine

## 2023-11-02 DIAGNOSIS — Z1231 Encounter for screening mammogram for malignant neoplasm of breast: Secondary | ICD-10-CM | POA: Diagnosis present

## 2023-11-03 ENCOUNTER — Other Ambulatory Visit: Payer: Self-pay | Admitting: Psychiatry

## 2023-11-03 MED ORDER — TRAZODONE HCL 50 MG PO TABS
150.0000 mg | ORAL_TABLET | Freq: Every day | ORAL | 5 refills | Status: AC
Start: 2023-11-03 — End: 2024-05-01

## 2023-11-17 ENCOUNTER — Other Ambulatory Visit: Payer: Self-pay | Admitting: Pulmonary Disease

## 2023-12-04 NOTE — Progress Notes (Unsigned)
 Virtual Visit via Video Note  I connected with Kristie Dorsey on 12/10/23 at 11:30 AM EDT by a video enabled telemedicine application and verified that I am speaking with the correct person using two identifiers.  Location: Patient: home Provider: home office Persons participated in the visit- patient, provider    I discussed the limitations of evaluation and management by telemedicine and the availability of in person appointments. The patient expressed understanding and agreed to proceed.   I discussed the assessment and treatment plan with the patient. The patient was provided an opportunity to ask questions and all were answered. The patient agreed with the plan and demonstrated an understanding of the instructions.   The patient was advised to call back or seek an in-person evaluation if the symptoms worsen or if the condition fails to improve as anticipated.   Kristie Sleet, MD    [facility] MD/PA/NP OP Progress Note  12/10/2023 12:06 PM ANYSHA Dorsey  MRN:  [id number]  Chief Complaint:  Chief Complaint  Patient presents with   Follow-up   HPI:  This is a follow-up appointment for depression, anxiety and insomnia.  She states that her mood is good.  She is doing things around the house, including taking care of her grandchild, who goes to school.  She states that her son is always negative.  The lease is up, and she and her husband will move into the apartment by themselves.  She feels stressed related to sibling rivalry at house.  She feels there is a lot on her.  She states that the relationship with her husband is not bad.  He is receptive when that she tried to communicate with him about the concern.  She feels nervous, and tries to go outside.  She feels down and hopeless at times.  She believes her mood was uplifting when she was on rexulti .  While she did not lose weight since being on Ozempic, she does not think it makes a lot of difference in weight when she discontinued  rexulti .  She denies SI, HI, hallucinations.  She has been taking pregabalin  prior to night only as she was feeling drowsy.  She agrees with the plans as outlined below.   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 Both of her biological parents abused alcohol. Her mother was using alcohol when she was born  Visit Diagnosis:    ICD-10-CM   1. MDD (major depressive disorder), recurrent episode, mild  F33.0     2. Anxiety disorder, unspecified type  F41.9     3. Insomnia, unspecified type  G47.00       Past Psychiatric History: Please see initial evaluation for full details. I have reviewed the history. No updates at this time.     Past Medical History:  Past Medical History:  Diagnosis Date   Abnormal uterine bleeding due  to endometrial polyp    Anemia    Anginal pain    Aortic atherosclerosis    Arthritis    Cardiomyopathy (HCC)    a.) TTE 03/25/2018: EF 30-35%; b.) TTE 08/03/2019: EF 30%; c.) TTE 01/15/2020: EF 35%; d.) TTE 12/31/2020: EF 30%; e.) TTE 08/20/2021: EF 25%   Carotid stenosis    a.) s/p RIGHT CEA 06/20/2020   CHF (congestive heart failure) (HCC)    a.) TTE 03/25/18: EF 30-35%, diff HK, mild MR, sev AS (MPG 39), G1DD; b.) TTE 08/03/19: EF 30%, mild LVH, glob HK, mod LAE, mild MR, triv TR; c.) TTE 01/15/20: EF 35%, mild LVH, basal-apical/antsep/infsep HK, mild LAE, triv TR/PR, mod MR,  G1DD; d.) TTE 12/31/20: EF 30%, mod glob HK, mod LAE, triv TR/PR, mild MR, G1DD; e.) TTE 08/20/21: EF 25%, mod MVE/LAE, glob HK, septal AK, MAC, triv TR, mild MR   Complication of anesthesia    a.) delayed emergence following AVR   COPD (chronic obstructive pulmonary disease) (HCC)    Coronary artery disease    Depression    Dyspnea    Hypertension    Hypothyroidism    LBBB (left bundle branch block)    Morbid obesity (HCC)    MRSA nasal colonization 06/20/2020   Nonrheumatic aortic (valve) stenosis    a.) TTE 03/25/2018: EF 30-35, sev AS (MPG 39 mmHg); b.) 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; 23 mm Edwards SAPIEN 3   Obesity hypoventilation syndrome (HCC)    On supplemental oxygen therapy    a.) 2-3 L/[place] as needed   OSA treated with BiPAP    T2DM (type 2 diabetes mellitus) (HCC)     Past Surgical History:  Procedure Laterality Date   CHOLECYSTECTOMY     DILATATION & CURETTAGE/HYSTEROSCOPY WITH MYOSURE N/A 09/25/2021   Procedure: FRACTIONAL DILATATION & CURETTAGE/HYSTEROSCOPY;  Surgeon: Schermerhorn, Kristie PARAS, MD;  Location: [facility];  Service: Gynecology;  Laterality: N/A;   ENDARTERECTOMY Right 06/20/2020   Procedure: ENDARTERECTOMY CAROTID;  Surgeon: Marea Selinda RAMAN, MD;  Location: [facility];  Service: Vascular;  Laterality: Right;   HYSTEROSCOPY WITH D & C N/A 02/28/2020   Procedure: DILATATION AND CURETTAGE;  Surgeon: Ward, Kristie BROCKS, MD;  Location: [facility];  Service: Gynecology;  Laterality: N/A;   LEFT HEART CATH AND CORONARY ANGIOGRAPHY N/A 04/14/2018   Procedure: LEFT HEART CATH AND CORONARY ANGIOGRAPHY;  Surgeon: Kristie Cara BIRCH, MD;  Location: [facility];  Service: Cardiovascular;  Laterality: N/A;   TONSILLECTOMY     TRANSCATHETER AORTIC VALVE REPLACEMENT, TRANSAORTIC Right 11/01/2018   Procedure: TRANSCATHETER AORTIC VALVE REPLACEMENT, TRANSAORTIC via a RIGHT FEMORAL APPROACH; Location: [facility]; Surgeon: Kristie Grumbling, MD   TUBAL LIGATION      Family Psychiatric History: Please  see initial evaluation for full details. I have reviewed the history. No updates at this time.     Family History:  Family History  Adopted: Yes  Problem Relation Age of Onset   Alcohol abuse Mother    Aneurysm Mother    Alcohol abuse Father    Cancer Father    Depression Daughter    ADD / ADHD Son    Bipolar disorder Son     Social History:  Social History   Socioeconomic History   Marital status: Married    Spouse name: eugene   Number of children: 2   Years of education: Not on file   Highest education level: Not on file  Occupational History   Occupation: Designer, multimedia    Comment:  disability  Tobacco Use   Smoking status: Every Day    Current packs/day: 1.50    Average packs/day: 1.5 packs/day for 50.8 years (76.2 ttl pk-yrs)    Types: Cigarettes    Start date: 03/02/1973   Smokeless tobacco: Never   Tobacco comments:    Smokes 0.5 PPD khj 04/20/2023        Started smoking at 63 yrs old    Smoked 2 PPD at her heaviest.  Vaping Use   Vaping status: Some Days   Start date: 11/01/2018   Substances: Nicotine   Devices: trying to quit  Substance and Sexual Activity   Alcohol use: No   Drug use: No   Sexual activity: Not Currently  Other Topics Concern   Not on file  Social History Narrative   Patient lives with husband and 2 daughters. Has dogs.   Patient feels safe in her home.   She still vapes on a daily basis. Nicotine in her liquid.   Social Drivers of Corporate investment banker Strain: Not on file  Food Insecurity: Food Insecurity Present (11/02/2018)   Received from [facility]   Hunger Vital Sign    Within the past 12 months, you worried that your food would run out before you got the money to buy more.: Sometimes true    Within the past 12 months, the food you bought just didn't last and you didn't have money to get more.: Sometimes true  Transportation Needs: Not on file  Physical Activity: Not on file  Stress: Not on file  Social Connections:  Not on file    Allergies: No Known Allergies  Metabolic Disorder Labs: No results found for: HGBA1C, MPG No results found for: PROLACTIN No results found for: CHOL, TRIG, HDL, CHOLHDL, VLDL, LDLCALC No results found for: TSH  Therapeutic Level Labs: No results found for: LITHIUM No results found for: VALPROATE No results found for: CBMZ  Current Medications: Current Outpatient Medications  Medication Sig Dispense Refill   acetaminophen  (TYLENOL ) 325 MG tablet Take by mouth.     albuterol  (VENTOLIN  HFA) 108 (90 Base) MCG/ACT inhaler Inhale 2 puffs into the lungs every 6 (six) hours as needed for wheezing or shortness of breath. 8 g 2   atorvastatin  (LIPITOR) 10 MG tablet TAKE 1 TABLET BY MOUTH ONCE DAILY. 90 tablet 0   carvedilol (COREG) 6.25 MG tablet Take 6.25 mg by mouth daily.     dapagliflozin propanediol (FARXIGA) 10 MG TABS tablet Take 10 mg by mouth daily.     ENTRESTO 97-103 MG Take 1 tablet by mouth 2 (two) times daily.     ferrous sulfate 325 (65 FE) MG tablet Take 325 mg by mouth daily with breakfast.     fluticasone (FLONASE) 50 MCG/ACT nasal spray Place into both nostrils.     furosemide  (LASIX ) 40 MG tablet Take 40 mg by mouth daily.      levothyroxine  (SYNTHROID , LEVOTHROID) 75 MCG tablet Take 75 mcg by mouth daily before breakfast.      meloxicam (MOBIC) 15 MG tablet Take 15 mg by mouth daily.     mometasone -formoterol  (DULERA ) 200-5 MCG/ACT AERO Inhale 2 puffs into the lungs 2 (two) times daily. Please schedule office visit before any future refills. 8.8 g 0   Multiple Vitamins-Minerals (MULTIVITAMIN WITH MINERALS) tablet Take 1 tablet by mouth daily.     OXYGEN Inhale 2-3 L/min into the lungs as needed (dyspnea, COPD related symptoms, obsesity related hypoventilation syndrome).  OZEMPIC, 0.25 OR 0.5 MG/DOSE, 2 MG/1.5ML SOPN Inject into the skin.     OZEMPIC, 2 MG/DOSE, 8 MG/3ML SOPN Inject into the skin.     pregabalin  (LYRICA ) 25 MG  capsule Take 1 capsule (25 mg total) by mouth 2 (two) times daily. 60 capsule 1   progesterone (PROMETRIUM) 200 MG capsule Take 200 mg by mouth at bedtime.     Spacer/Aero-Holding Chambers (AEROCHAMBER MV) inhaler Use as instructed 1 each 0   spironolactone  (ALDACTONE ) 25 MG tablet Take 25 mg by mouth daily.     Tiotropium Bromide  Monohydrate (SPIRIVA  RESPIMAT) 2.5 MCG/ACT AERS Inhale 2 puffs into the lungs daily. 4 g 11   traMADol (ULTRAM) 50 MG tablet Take 50 mg by mouth daily as needed.     traZODone  (DESYREL ) 50 MG tablet Take 3 tablets (150 mg total) by mouth at bedtime. 90 tablet 5   venlafaxine  XR (EFFEXOR -XR) 150 MG 24 hr capsule Take 1 capsule (150 mg total) by mouth daily with breakfast. 30 capsule 5   [START ON 12/14/2023] venlafaxine  XR (EFFEXOR -XR) 75 MG 24 hr capsule Take 1 capsule (75 mg total) by mouth daily with breakfast. Take total of 225 mg daily, take along with 150 mg cap 30 capsule 5   No current facility-administered medications for this visit.     Musculoskeletal: Strength & Muscle Tone: N/A Gait & Station: N/A Patient leans: N/A  Psychiatric Specialty Exam: Review of Systems  Psychiatric/Behavioral:  Positive for dysphoric mood. Negative for agitation, behavioral problems, confusion, decreased concentration, hallucinations, self-injury, sleep disturbance and suicidal ideas. The patient is nervous/anxious. The patient is not hyperactive.   All other systems reviewed and are negative.   Last menstrual period 02/21/2020.There is no height or weight on file to calculate BMI.  General Appearance: Well Groomed  Eye Contact:  Good  Speech:  Clear and Coherent  Volume:  Normal  Mood:  Depressed  Affect:  Appropriate, Congruent, and calm  Thought Process:  Coherent  Orientation:  Full (Time, Place, and Person)  Thought Content: Logical   Suicidal Thoughts:  No  Homicidal Thoughts:  No  Memory:  Immediate;   Good  Judgement:  Good  Insight:  Good  Psychomotor  Activity:  Normal  Concentration:  Concentration: Good and Attention Span: Good  Recall:  Good  Fund of Knowledge: Good  Language: Good  Akathisia:  No  Handed:  Right  AIMS (if indicated): not done  Assets:  Communication Skills Desire for Improvement  ADL's:  Intact  Cognition: WNL  Sleep:  Good   Screenings: GAD-7    Flowsheet Row Office Visit from 06/17/2023 in [facility] Office Visit from 04/02/2022 in [facility]  Total GAD-7 Score 4 4   PHQ2-9    Flowsheet Row Office Visit from 06/17/2023 in [facility] Counselor from 11/10/2022 in [facility] Office Visit from 09/06/7973 in [facility]  PHQ-2 Total Score 2 4 2   PHQ-9 Total Score 5 12 9    Flowsheet Row Office Visit from 04/02/2022 in [facility] Admission (Discharged) from 09/25/2021 in [facility] Pre-Admission Testing 44 from 09/18/2021 in [facility] PRE [facility]MISSION TEST[facility]  C-SSRS RISK CATEGORY No Risk No Risk No Risk     Assessment and Plan:  KAYSHA PARSELL is a 63 y.o. year old female with  a history of COPD, chronic respiratory failure on home oxygen, OSA, chronic systolic CHF (EF 74%, NYHA III), s/p ICD, s/p TAVR, hypothyroidism, who presents for follow up for below.  1. MDD (major depressive disorder), recurrent episode, mild 2. Anxiety disorder, unspecified type The patient reports a history of knee pain and prenatal alcohol exposure, as her mother was drinking during pregnancy. Both of her biological parents had histories of alcohol abuse. Socially, she was adopted at age 60, and she currently reports difficulty with her son, who lacks boundaries. History: depression since age 63, originally on sertraline 100 mg  daily, rexulti  0.5 mg daily, trazodone  150 mg qhs   She reports down mood and ongoing anxiety since the last visit.  While she reports an improved relationship with her husband and is looking forward to moving into the apartment together, she continues to experience challenges with her current mood.  She reports good benefit from rexulti , which was previously discontinued to reduce the risk of weight gain.  She is willing to restart this medication after she is seen by cardiologist.  Discussed potential risk of QTc prolongation, EPS, metabolic side effect.  Will continue venlafaxine  to target depression and anxiety.  Will discontinue pregabalin  given she reports adverse reaction of drowsiness and limited benefit from the current dose. She will greatly benefit from CBT; referral was made.  3. Insomnia, unspecified type - uses CPAP machine regularly       Overall stable.  Will continue trazodone  as needed for insomnia.    Plan Continue venlafaxine  225 mg daily  Hold pregabalin  Stat rexulti  0.25 mg daily after she discusses this with her cardiologist/obtaining EKG Continue Trazodone  150 mg at night as needed for insomnia Next appointment: 12/10 at 3 30, video Referred for therapy - on Ozempic QTc HR 80, 470 msec 06/2022   Past trials- Buspar  (felt sick), rexulti  (discontinued to mitigate its risk of weight gain), pregabalin  (drowsiness),  trazodone , Ambien ,    The patient demonstrates the following risk factors for suicide: Chronic risk factors for suicide include: psychiatric disorder of depression . Acute risk factors for suicide include: family or marital conflict and unemployment. Protective factors for this patient include: positive social support. Considering these factors, the overall suicide risk at this point appears to be low. Patient is appropriate for outpatient follow up.     Collaboration of Care: Collaboration of Care: Other reviewed notes in Epic  Patient/Guardian was advised  Release of Information must be obtained prior to any record release in order to collaborate their care with an outside provider. Patient/Guardian was advised if they have not already done so to contact the registration department to sign all necessary forms in order for us  to release information regarding their care.   Consent: Patient/Guardian gives verbal consent for treatment and assignment of benefits for services provided during this visit. Patient/Guardian expressed understanding and agreed to proceed.    Kristie Sleet, MD 12/10/2023, 12:06 PM

## 2023-12-10 ENCOUNTER — Encounter: Payer: Self-pay | Admitting: Psychiatry

## 2023-12-10 ENCOUNTER — Telehealth (INDEPENDENT_AMBULATORY_CARE_PROVIDER_SITE_OTHER): Admitting: Psychiatry

## 2023-12-10 DIAGNOSIS — F330 Major depressive disorder, recurrent, mild: Secondary | ICD-10-CM

## 2023-12-10 DIAGNOSIS — F419 Anxiety disorder, unspecified: Secondary | ICD-10-CM | POA: Diagnosis not present

## 2023-12-10 DIAGNOSIS — G4700 Insomnia, unspecified: Secondary | ICD-10-CM

## 2023-12-10 MED ORDER — VENLAFAXINE HCL ER 75 MG PO CP24: 75.0000 mg | ORAL_CAPSULE | Freq: Every day | ORAL | 5 refills | Status: AC

## 2023-12-10 NOTE — Patient Instructions (Signed)
 Continue venlafaxine  225 mg daily  Hold pregabalin  Plan to stat rexulti  0.25 mg daily after you are seen by cardiologist Continue Trazodone  150 mg at night as needed for insomnia Next appointment: 12/10 at 3 30

## 2023-12-15 ENCOUNTER — Ambulatory Visit: Admitting: Licensed Clinical Social Worker

## 2023-12-15 ENCOUNTER — Other Ambulatory Visit: Payer: Self-pay | Admitting: Psychiatry

## 2023-12-15 DIAGNOSIS — Z79899 Other long term (current) drug therapy: Secondary | ICD-10-CM

## 2023-12-27 ENCOUNTER — Other Ambulatory Visit (INDEPENDENT_AMBULATORY_CARE_PROVIDER_SITE_OTHER): Payer: Self-pay | Admitting: Nurse Practitioner

## 2024-01-04 ENCOUNTER — Ambulatory Visit: Admitting: Licensed Clinical Social Worker

## 2024-01-12 ENCOUNTER — Emergency Department

## 2024-01-12 ENCOUNTER — Encounter: Payer: Self-pay | Admitting: Emergency Medicine

## 2024-01-12 ENCOUNTER — Emergency Department
Admission: EM | Admit: 2024-01-12 | Discharge: 2024-01-12 | Disposition: A | Discharge status: Home / Self Care | Attending: Emergency Medicine | Admitting: Emergency Medicine

## 2024-01-12 ENCOUNTER — Other Ambulatory Visit: Payer: Self-pay

## 2024-01-12 DIAGNOSIS — E119 Type 2 diabetes mellitus without complications: Secondary | ICD-10-CM | POA: Insufficient documentation

## 2024-01-12 DIAGNOSIS — I110 Hypertensive heart disease with heart failure: Secondary | ICD-10-CM | POA: Insufficient documentation

## 2024-01-12 DIAGNOSIS — R10A1 Flank pain, right side: Secondary | ICD-10-CM | POA: Insufficient documentation

## 2024-01-12 DIAGNOSIS — I509 Heart failure, unspecified: Secondary | ICD-10-CM | POA: Diagnosis not present

## 2024-01-12 DIAGNOSIS — R1031 Right lower quadrant pain: Secondary | ICD-10-CM | POA: Insufficient documentation

## 2024-01-12 DIAGNOSIS — J449 Chronic obstructive pulmonary disease, unspecified: Secondary | ICD-10-CM | POA: Diagnosis not present

## 2024-01-12 LAB — URINALYSIS, ROUTINE W REFLEX MICROSCOPIC
Bacteria, UA: NONE SEEN
Bilirubin Urine: NEGATIVE
Glucose, UA: >=500 mg/dL — AB
Hgb urine dipstick: NEGATIVE
Ketones, ur: NEGATIVE mg/dL
Leukocytes,Ua: NEGATIVE
Nitrite: NEGATIVE
Protein, ur: NEGATIVE mg/dL
Specific Gravity, Urine: 1.02 (ref 1.005–1.030)
pH: 5 (ref 5.0–8.0)

## 2024-01-12 LAB — CBC
HCT: 47.1 % — ABNORMAL HIGH (ref 36.0–46.0)
Hemoglobin: 14.7 g/dL (ref 12.0–15.0)
MCH: 28.6 pg (ref 26.0–34.0)
MCHC: 31.2 g/dL (ref 30.0–36.0)
MCV: 91.6 fL (ref 80.0–100.0)
Platelets: 188 K/uL (ref 150–400)
RBC: 5.14 MIL/uL — ABNORMAL HIGH (ref 3.87–5.11)
RDW: 14.2 % (ref 11.5–15.5)
WBC: 11.2 K/uL — ABNORMAL HIGH (ref 4.0–10.5)
nRBC: 0 % (ref 0.0–0.2)

## 2024-01-12 LAB — BASIC METABOLIC PANEL WITH GFR
Anion gap: 8 (ref 5–15)
BUN: 14 mg/dL (ref 8–23)
CO2: 31 mmol/L (ref 22–32)
Calcium: 9.3 mg/dL (ref 8.9–10.3)
Chloride: 100 mmol/L (ref 98–111)
Creatinine, Ser: 0.85 mg/dL (ref 0.44–1.00)
GFR, Estimated: >60 mL/min (ref >60)
Glucose, Bld: 99 mg/dL (ref 70–99)
Potassium: 4.3 mmol/L (ref 3.5–5.1)
Sodium: 139 mmol/L (ref 135–145)

## 2024-01-12 MED ORDER — METHOCARBAMOL 1000 MG/10ML IJ SOLN
1000.0000 mg | Freq: Once | INTRAMUSCULAR | Status: AC
  Administered 2024-01-12: 1000 mg via INTRAVENOUS
  Filled 2024-01-12: qty 10

## 2024-01-12 MED ORDER — IOHEXOL 300 MG/ML  SOLN
100.0000 mL | Freq: Once | INTRAMUSCULAR | Status: AC | PRN
  Administered 2024-01-12: 100 mL via INTRAVENOUS

## 2024-01-12 MED ORDER — VALACYCLOVIR HCL 1 G PO TABS: 1000.0000 mg | ORAL_TABLET | Freq: Three times a day (TID) | ORAL | 0 refills | Status: AC

## 2024-01-12 MED ORDER — KETOROLAC TROMETHAMINE 30 MG/ML IJ SOLN
30.0000 mg | Freq: Once | INTRAMUSCULAR | Status: AC
  Administered 2024-01-12: 30 mg via INTRAVENOUS
  Filled 2024-01-12: qty 1

## 2024-01-12 MED ORDER — HYDROMORPHONE HCL 1 MG/ML IJ SOLN
0.5000 mg | Freq: Once | INTRAMUSCULAR | Status: AC
  Administered 2024-01-12: 0.5 mg via INTRAVENOUS
  Filled 2024-01-12: qty 0.5

## 2024-01-12 MED ORDER — TRAMADOL HCL 50 MG PO TABS: 50.0000 mg | ORAL_TABLET | Freq: Four times a day (QID) | ORAL | 0 refills | Status: AC | PRN

## 2024-01-12 MED ORDER — SODIUM CHLORIDE 0.9 % IV BOLUS
500.0000 mL | Freq: Once | INTRAVENOUS | Status: AC
  Administered 2024-01-12: 500 mL via INTRAVENOUS

## 2024-01-12 NOTE — ED Provider Notes (Signed)
-----------------------------------------   5:38 PM on 01/12/2024 -----------------------------------------  I took over care of this patient from Dr. Jossie.  CT is negative for acute findings.  Urinalysis is negative.  The patient did require some additional pain medication, but at this time is comfortable.  I reexamined the patient.  She reports bandlike pain exclusively to the right lower back and into the flank.  On examination currently, she actually does have a papular rash to the right lower back corresponding to the main area of the pain.  There are no vesicles.  However, this very well could be shingles.  The patient states that the severe pain has been about 2 days.  Therefore, I will prescribe Valtrex in addition to analgesia.  The patient was comfortable with this plan.  She is stable for discharge home.  I counseled her on the results of the workup, the likely etiologies of her symptoms, and on return precautions; she expressed understanding.   Jacolyn Pae, MD 01/12/24 1739

## 2024-01-12 NOTE — ED Provider Notes (Signed)
 [facility] Provider Note   Event Date/Time   First MD Initiated Contact with Patient 01/12/24 1030     (approximate) History  Flank Pain  HPI Kristie Dorsey is a 63 y.o. female with a past medical history of hypertension, CHF, COPD, morbid obesity with hypoventilation syndrome, and type 2 diabetes who presents complaining of right flank pain that has been present over the last 3 days.  Patient states that it is always present however occasionally it becomes worsened in severity for approximately 10-15 minutes and then goes back to a baseline pain.  Patient endorses this pain radiating around to the right upper quadrant.  Patient denies any exacerbating or relieving factors for this pain.  Patient endorses associated nausea without vomiting ROS: Patient currently denies any vision changes, tinnitus, difficulty speaking, facial droop, sore throat, chest pain, shortness of breath, vomiting/diarrhea, dysuria, or weakness/numbness/paresthesias in any extremity   Physical Exam  Triage Vital Signs: ED Triage Vitals  Encounter Vitals Group     BP 01/12/24 0924 (!) 152/74     Girls Systolic BP Percentile --      Girls Diastolic BP Percentile --      Boys Systolic BP Percentile --      Boys Diastolic BP Percentile --      Pulse Rate 01/12/24 0924 80     Resp 01/12/24 0924 18     Temp 01/12/24 0922 97.8 F (36.6 C)     Temp Source 01/12/24 0922 Oral     SpO2 01/12/24 0924 98 %     Weight 01/12/24 0922 278 lb (126.1 kg)     Height 01/12/24 0922 5' 1 (1.549 m)     Head Circumference --      Peak Flow --      Pain Score 01/12/24 0922 10     Pain Loc --      Pain Education --      Exclude from Growth Chart --    Most recent vital signs: Vitals:   01/12/24 0922 01/12/24 0924  BP:  (!) 152/74  Pulse:  80  Resp:  18  Temp: 97.8 F (36.6 C)   SpO2:  98%   General: Awake, oriented x4. CV:  Good peripheral perfusion. Resp:  Normal effort. Abd:  No  distention. Other:  Elderly morbidly obese Caucasian female resting comfortably in no acute distress.  Tenderness to palpation over the left upper lumbar paraspinal musculature with no central tenderness. ED Results / Procedures / Treatments  Labs (all labs ordered are listed, but only abnormal results are displayed) Labs Reviewed  CBC - Abnormal; Notable for the following components:      Result Value   WBC 11.2 (*)    RBC 5.14 (*)    HCT 47.1 (*)    All other components within normal limits  BASIC METABOLIC PANEL WITH GFR  URINALYSIS, ROUTINE W REFLEX MICROSCOPIC   RADIOLOGY ED MD interpretation: Pending - All radiology independently interpreted and agree with radiology assessment Official radiology report(s): No results found. PROCEDURES: Critical Care performed: No Procedures MEDICATIONS ORDERED IN ED: Medications - No data to display IMPRESSION / MDM / ASSESSMENT AND PLAN / ED COURSE  I reviewed the triage vital signs and the nursing notes.                             The patient is on the cardiac monitor to evaluate for evidence of  arrhythmia and/or significant heart rate changes. Patient's presentation is most consistent with acute presentation with potential threat to life or bodily function. Patient is a 63 year old female with the above-stated past medical history that presents complaining of right flank pain with associated mild nausea DDx: Kidney stone, ascending urinary tract infection, vertebral disease, musculoskeletal injury Plan: CBC, BMP, UA Will proceed with CT of the abdomen and pelvis with IV contrast given leukocytosis.  CT pending at the end of the shift  Care of this patient will be signed out to the oncoming physician at the end of my shift.  All pertinent patient information conveyed and all questions answered.  All further care and disposition decisions will be made by the oncoming physician.    FINAL CLINICAL IMPRESSION(S) / ED DIAGNOSES   Final  diagnoses:  None   Rx / DC Orders   ED Discharge Orders     None      Note:  This document was prepared using Dragon voice recognition software and may include unintentional dictation errors.   Navjot Pilgrim K, MD 01/12/24 [phone]

## 2024-01-12 NOTE — ED Triage Notes (Signed)
 Patient to ED via POV for right sided flank pain. Denies urinary difficulty but no BM in 3 days. Seen PCP yesterday and given a muscle relaxant with no relief.

## 2024-01-12 NOTE — Discharge Instructions (Addendum)
 It is very likely that your pain is due to shingles.  You can discontinue the muscle relaxer.  Start taking tramadol as needed for pain.  Take the Valtrex every 8 hours (3 times daily) for 7 days.  Return to the ER for new, worsening, or persistent severe pain, fever, vomiting, weakness or numbness in the leg, or any other new or worsening symptoms that concern you.  Follow-up with your primary care provider.

## 2024-02-03 ENCOUNTER — Other Ambulatory Visit (INDEPENDENT_AMBULATORY_CARE_PROVIDER_SITE_OTHER): Payer: Self-pay | Admitting: Nurse Practitioner

## 2024-02-05 NOTE — Progress Notes (Unsigned)
 Virtual Visit via Video Note  I connected with Kristie Dorsey on 02/09/24 at  3:30 PM EST by a video enabled telemedicine application and verified that I am speaking with the correct person using two identifiers.  Location: Patient: home Provider: home office Persons participated in the visit- patient, provider    I discussed the limitations of evaluation and management by telemedicine and the availability of in person appointments. The patient expressed understanding and agreed to proceed.    I discussed the assessment and treatment plan with the patient. The patient was provided an opportunity to ask questions and all were answered. The patient agreed with the plan and demonstrated an understanding of the instructions.   The patient was advised to call back or seek an in-person evaluation if the symptoms worsen or if the condition fails to improve as anticipated.   Kristie Sleet, MD    [facility] MD/PA/NP OP Progress Note  02/09/2024 5:03 PM Kristie Dorsey  MRN:  [id number]  Chief Complaint:  Chief Complaint  Patient presents with   Follow-up   HPI:  This is a follow-up appointment for depression, anxiety, insomnia.  She states that her husband mentioned that she was doing well when she was on fluoxetine , and rexulti .  She feels her mood is up (neutral) and down.  She had a good Thanksgiving with her family.  The relationship with her husband has been getting better.  However, she continues to feel down.  She reports anxiety has been better, and she feels calm since being on pregabalin .  She denies any drowsiness.  She denies change in appetite.  She denies SI, hallucinations.  She does not recall why fluoxetine  was discontinued.  She agrees with the plans as outlined below.    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 Both of her biological parents abused alcohol. Her mother was using alcohol when she was born  Visit Diagnosis:    ICD-10-CM   1. MDD (major depressive disorder), recurrent episode, mild  F33.0     2. Anxiety disorder, unspecified type  F41.9     3. Insomnia, unspecified type  G47.00     4. High risk medication use  Z79.899       Past Psychiatric History: Please see initial evaluation for full details. I have reviewed the history. No updates at this time.     Past Medical History:  Past Medical History:  Diagnosis Date   Abnormal uterine bleeding due to endometrial polyp    Anemia    Anginal pain    Aortic atherosclerosis    Arthritis    Cardiomyopathy (HCC)    a.) TTE 03/25/2018: EF 30-35%; b.) TTE 08/03/2019: EF 30%; c.) TTE 01/15/2020: EF 35%; d.) TTE 12/31/2020: EF 30%; e.) TTE 08/20/2021: EF 25%   Carotid stenosis    a.) s/p RIGHT CEA 06/20/2020   CHF (congestive heart failure) (HCC)    a.) TTE 03/25/18: EF 30-35%, diff HK, mild MR, sev AS (MPG 39), G1DD; b.) TTE 08/03/19: EF 30%, mild LVH, glob  HK, mod LAE, mild MR, triv TR; c.) TTE 01/15/20: EF 35%, mild LVH, basal-apical/antsep/infsep HK, mild LAE, triv TR/PR, mod MR, G1DD; d.) TTE 12/31/20: EF 30%, mod glob HK, mod LAE, triv TR/PR, mild MR, G1DD; e.) TTE 08/20/21: EF 25%, mod MVE/LAE, glob HK, septal AK, MAC, triv TR, mild MR   Complication of anesthesia    a.) delayed emergence following AVR   COPD (chronic obstructive pulmonary disease) (HCC)    Coronary artery disease    Depression    Dyspnea    Hypertension    Hypothyroidism    LBBB (left bundle branch block)    Morbid obesity (HCC)    MRSA nasal colonization 06/20/2020    Nonrheumatic aortic (valve) stenosis    a.) TTE 03/25/2018: EF 30-35, sev AS (MPG 39 mmHg); b.) 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; 23 mm Edwards SAPIEN 3   Obesity hypoventilation syndrome (HCC)    On supplemental oxygen therapy    a.) 2-3 L/[place] as needed   OSA treated with BiPAP    T2DM (type 2 diabetes mellitus) (HCC)     Past Surgical History:  Procedure Laterality Date   CHOLECYSTECTOMY     DILATATION & CURETTAGE/HYSTEROSCOPY WITH MYOSURE N/A 09/25/2021   Procedure: FRACTIONAL DILATATION & CURETTAGE/HYSTEROSCOPY;  Surgeon: Schermerhorn, Debby PARAS, MD;  Location: [facility];  Service: Gynecology;  Laterality: N/A;   ENDARTERECTOMY Right 06/20/2020   Procedure: ENDARTERECTOMY CAROTID;  Surgeon: Marea Selinda RAMAN, MD;  Location: [facility];  Service: Vascular;  Laterality: Right;   HYSTEROSCOPY WITH D & C N/A 02/28/2020   Procedure: DILATATION AND CURETTAGE;  Surgeon: Ward, Mitzie BROCKS, MD;  Location: [facility];  Service: Gynecology;  Laterality: N/A;   LEFT HEART CATH AND CORONARY ANGIOGRAPHY N/A 04/14/2018   Procedure: LEFT HEART CATH AND CORONARY ANGIOGRAPHY;  Surgeon: Florencio Cara BIRCH, MD;  Location: [facility];  Service: Cardiovascular;  Laterality: N/A;   TONSILLECTOMY     TRANSCATHETER AORTIC VALVE REPLACEMENT, TRANSAORTIC Right 11/01/2018   Procedure: TRANSCATHETER AORTIC VALVE REPLACEMENT, TRANSAORTIC via a RIGHT FEMORAL APPROACH; Location: [facility]; Surgeon: Norleen Grumbling, MD   TUBAL LIGATION      Family Psychiatric History: Please see initial evaluation for full details. I have reviewed the history. No updates at this time.     Family History:  Family History  Adopted: Yes  Problem Relation Age of Onset   Alcohol abuse Mother    Aneurysm Mother    Alcohol abuse Father    Cancer Father    Depression Daughter    ADD / ADHD Son    Bipolar disorder Son     Social History:  Social History   Socioeconomic History   Marital status: Married    Spouse name: eugene   Number of children: 2    Years of education: Not on file   Highest education level: Not on file  Occupational History   Occupation: designer, multimedia    Comment: disability  Tobacco Use   Smoking status: Every Day    Current packs/day: 1.50    Average packs/day: 1.5 packs/day for 50.9 years (76.4 ttl pk-yrs)    Types: Cigarettes    Start date: 03/02/1973   Smokeless tobacco: Never   Tobacco comments:    Smokes 0.5 PPD khj 04/20/2023        Started smoking at 63 yrs old    Smoked 2 PPD at her heaviest.  Vaping Use   Vaping status: Some Days   Start date: 11/01/2018   Substances: Nicotine  Devices: trying to quit  Substance and Sexual Activity   Alcohol use: No   Drug use: No   Sexual activity: Not Currently  Other Topics Concern   Not on file  Social History Narrative   Patient lives with husband and 2 daughters. Has dogs.   Patient feels safe in her home.   She still vapes on a daily basis. Nicotine in her liquid.   Social Drivers of Corporate Investment Banker Strain: Not on file  Food Insecurity: Food Insecurity Present (11/02/2018)   [facility]   Hunger Vital Sign    Within the past 12 months, you worried that your food would run out before you got the money to buy more.: Sometimes true    Within the past 12 months, the food you bought just didn't last and you didn't have money to get more.: Sometimes true  Transportation Needs: Not on file  Physical Activity: Not on file  Stress: Not on file  Social Connections: Not on file    Allergies: No Known Allergies  Metabolic Disorder Labs: No results found for: HGBA1C, MPG No results found for: PROLACTIN No results found for: CHOL, TRIG, HDL, CHOLHDL, VLDL, LDLCALC No results found for: TSH  Therapeutic Level Labs: No results found for: LITHIUM No results found for: VALPROATE No results found for: CBMZ  Current Medications: Current Outpatient Medications  Medication Sig Dispense Refill    brexpiprazole  (REXULTI ) 0.25 MG TABS tablet Take 1 tablet (0.25 mg total) by mouth daily. 30 tablet 1   acetaminophen  (TYLENOL ) 325 MG tablet Take by mouth.     albuterol  (VENTOLIN  HFA) 108 (90 Base) MCG/ACT inhaler Inhale 2 puffs into the lungs every 6 (six) hours as needed for wheezing or shortness of breath. 8 g 2   atorvastatin  (LIPITOR) 10 MG tablet TAKE 1 TABLET BY MOUTH ONCE DAILY. 90 tablet 0   carvedilol (COREG) 6.25 MG tablet Take 6.25 mg by mouth daily.     dapagliflozin propanediol (FARXIGA) 10 MG TABS tablet Take 10 mg by mouth daily.     ENTRESTO 97-103 MG Take 1 tablet by mouth 2 (two) times daily.     ferrous sulfate 325 (65 FE) MG tablet Take 325 mg by mouth daily with breakfast.     fluticasone (FLONASE) 50 MCG/ACT nasal spray Place into both nostrils.     furosemide  (LASIX ) 40 MG tablet Take 40 mg by mouth daily.      levothyroxine  (SYNTHROID , LEVOTHROID) 75 MCG tablet Take 75 mcg by mouth daily before breakfast.      meloxicam (MOBIC) 15 MG tablet Take 15 mg by mouth daily.     mometasone -formoterol  (DULERA ) 200-5 MCG/ACT AERO Inhale 2 puffs into the lungs 2 (two) times daily. Please schedule office visit before any future refills. 8.8 g 0   Multiple Vitamins-Minerals (MULTIVITAMIN WITH MINERALS) tablet Take 1 tablet by mouth daily.     OXYGEN Inhale 2-3 L/min into the lungs as needed (dyspnea, COPD related symptoms, obsesity related hypoventilation syndrome).     OZEMPIC, 0.25 OR 0.5 MG/DOSE, 2 MG/1.5ML SOPN Inject into the skin.     OZEMPIC, 2 MG/DOSE, 8 MG/3ML SOPN Inject into the skin.     pregabalin  (LYRICA ) 25 MG capsule Take 1 capsule (25 mg total) by mouth 2 (two) times daily. 60 capsule 1   progesterone (PROMETRIUM) 200 MG capsule Take 200 mg by mouth at bedtime.     Spacer/Aero-Holding Chambers (AEROCHAMBER MV) inhaler Use as instructed 1  each 0   spironolactone  (ALDACTONE ) 25 MG tablet Take 25 mg by mouth daily.     Tiotropium Bromide  Monohydrate (SPIRIVA  RESPIMAT)  2.5 MCG/ACT AERS Inhale 2 puffs into the lungs daily. 4 g 11   traMADol  (ULTRAM ) 50 MG tablet Take 1 tablet (50 mg total) by mouth every 6 (six) hours as needed. 20 tablet 0   traZODone  (DESYREL ) 50 MG tablet Take 3 tablets (150 mg total) by mouth at bedtime. 90 tablet 5   venlafaxine  XR (EFFEXOR -XR) 150 MG 24 hr capsule Take 1 capsule (150 mg total) by mouth daily with breakfast. 30 capsule 5   venlafaxine  XR (EFFEXOR -XR) 75 MG 24 hr capsule Take 1 capsule (75 mg total) by mouth daily with breakfast. Take total of 225 mg daily, take along with 150 mg cap 30 capsule 5   No current facility-administered medications for this visit.     Musculoskeletal: Strength & Muscle Tone: N/A Gait & Station: N/A Patient leans: N/A  Psychiatric Specialty Exam: Review of Systems  Psychiatric/Behavioral:  Positive for dysphoric mood. Negative for agitation, behavioral problems, confusion, decreased concentration, hallucinations, self-injury, sleep disturbance and suicidal ideas. The patient is not nervous/anxious and is not hyperactive.   All other systems reviewed and are negative.   Last menstrual period 02/21/2020.There is no height or weight on file to calculate BMI.  General Appearance: Well Groomed  Eye Contact:  Good  Speech:  Clear and Coherent  Volume:  Normal  Mood:  Depressed  Affect:  Appropriate, Congruent, and slightly down  Thought Process:  Coherent  Orientation:  Full (Time, Place, and Person)  Thought Content: Logical   Suicidal Thoughts:  No  Homicidal Thoughts:  No  Memory:  Immediate;   Good  Judgement:  Good  Insight:  Good  Psychomotor Activity:  Normal  Concentration:  Concentration: Good and Attention Span: Good  Recall:  Good  Fund of Knowledge: Good  Language: Good  Akathisia:  No  Handed:  Right  AIMS (if indicated): not done  Assets:  Communication Skills Desire for Improvement  ADL's:  Intact  Cognition: WNL  Sleep:  Fair   Screenings: GAD-7     Flowsheet Row Office Visit from 06/17/2023 in [facility] Office Visit from 04/02/2022 in [facility]  Total GAD-7 Score 4 4   PHQ2-9    Flowsheet Row Office Visit from 06/17/2023 in [facility] Counselor from 11/10/2022 in [facility] [facility] Office Visit from 09/06/7973 in [facility]  PHQ-2 Total Score 2 4 2   PHQ-9 Total Score 5 12 9    Flowsheet Row ED from 01/12/2024 in [facility] at [facility] Visit from 04/02/2022 in [facility] Admission (Discharged) from 09/25/2021 in [facility]  C-SSRS RISK CATEGORY No Risk No Risk No Risk     Assessment and Plan:  Kristie Dorsey is a 63 y.o.  female with a history of COPD, chronic respiratory failure on home oxygen, OSA, chronic systolic CHF (EF 74%, NYHA III), s/p ICD, s/p TAVR, hypothyroidism, who presents for follow up for below.  1. MDD (major depressive disorder), recurrent episode, mild 2. Anxiety disorder, unspecified type The patient reports a history of knee pain and prenatal alcohol exposure, as her mother was drinking during pregnancy. Both of her biological parents had histories of alcohol abuse. Socially, she  was adopted at age 48, and she currently reports difficulty with her son, who lacks boundaries. History: depression since age 89, originally on sertraline 100 mg daily, rexulti  0.5 mg daily, trazodone  150 mg qhs She continues to experience depressive symptoms, although there has been overall improvement in anxiety since being on pregabalin .  She also reports improved relationship with her husband.  She is wanting to try rexulti  especially given she had a good benefit when she was on combination with fluoxetine .  Discussed  potential metabolic side effect, EPS and QTc prolongation.  Noted that according to the patient, who communicated with her cardiologist, the provider denies any concern about starting this medication.  Will continue venlafaxine  to target depression and anxiety.  Will continue pregabalin  given she reports significant benefit for anxiety.  She will greatly benefit from CBT; referral was made.   3. Insomnia, unspecified type - uses CPAP machine regularly     Overall improving.  Will continue trazodone  as needed for insomnia.   4. High risk medication use      Last checked  EKG HR 74, QTc435msec 01/2024  Lipid panels  Due  HbA1c Glu 102 06/2023     Plan Continue venlafaxine  225 mg daily  Continue pregabalin  10 mg twice a day Stat rexulti  0.25 mg daily Continue Trazodone  150 mg at night as needed for insomnia Next appointment: 1/28 at 3 30, video Referred for therapy - on Ozempic QTc HR 80, 470 msec 06/2022   Past trials- Buspar  (felt sick), rexulti  (discontinued to mitigate its risk of weight gain), pregabalin  (drowsiness),  trazodone , Ambien ,    The patient demonstrates the following risk factors for suicide: Chronic risk factors for suicide include: psychiatric disorder of depression . Acute risk factors for suicide include: family or marital conflict and unemployment. Protective factors for this patient include: positive social support. Considering these factors, the overall suicide risk at this point appears to be low. Patient is appropriate for outpatient follow up.     Collaboration of Care: Collaboration of Care: Other reviewed notes in Epic  Patient/Guardian was advised Release of Information must be obtained prior to any record release in order to collaborate their care with an outside provider. Patient/Guardian was advised if they have not already done so to contact the registration department to sign all necessary forms in order for us  to release information regarding their care.    Consent: Patient/Guardian gives verbal consent for treatment and assignment of benefits for services provided during this visit. Patient/Guardian expressed understanding and agreed to proceed.    Kristie Sleet, MD 02/09/2024, 5:03 PM

## 2024-02-09 ENCOUNTER — Telehealth (INDEPENDENT_AMBULATORY_CARE_PROVIDER_SITE_OTHER): Admitting: Psychiatry

## 2024-02-09 ENCOUNTER — Encounter: Payer: Self-pay | Admitting: Psychiatry

## 2024-02-09 DIAGNOSIS — Z79899 Other long term (current) drug therapy: Secondary | ICD-10-CM

## 2024-02-09 DIAGNOSIS — F330 Major depressive disorder, recurrent, mild: Secondary | ICD-10-CM | POA: Diagnosis not present

## 2024-02-09 DIAGNOSIS — G4700 Insomnia, unspecified: Secondary | ICD-10-CM

## 2024-02-09 DIAGNOSIS — F419 Anxiety disorder, unspecified: Secondary | ICD-10-CM

## 2024-02-09 MED ORDER — BREXPIPRAZOLE 0.25 MG PO TABS: 0.2500 mg | ORAL_TABLET | Freq: Every day | ORAL | 1 refills | Status: DC

## 2024-02-09 MED ORDER — PREGABALIN 25 MG PO CAPS: 25.0000 mg | ORAL_CAPSULE | Freq: Two times a day (BID) | ORAL | 1 refills | Status: AC

## 2024-02-09 NOTE — Patient Instructions (Signed)
 Continue venlafaxine  225 mg daily  Continue pregabalin  10 mg twice a day Stat rexulti  0.25 mg daily Continue Trazodone  150 mg at night as needed for insomnia Next appointment: 1/28 at 3 30

## 2024-03-07 ENCOUNTER — Ambulatory Visit: Admitting: Licensed Clinical Social Worker

## 2024-03-07 DIAGNOSIS — Z91199 Patient's noncompliance with other medical treatment and regimen due to unspecified reason: Secondary | ICD-10-CM

## 2024-03-07 NOTE — Progress Notes (Signed)
 Clinician attempted session via face-to-face, but Kristie Dorsey did not appear for her session. Cln waited 15 minutes after appointment for start but patient never appeared.  Per [employer], s/he will be charged a no-show fee and will be dismissed due to attendance.

## 2024-03-08 NOTE — Progress Notes (Signed)
 Chief Complaint: Chief Complaint  Patient presents with   Knee Pain    Left knee pain    History of Present Illness  Presents inJudy CHRISTELLA Dorsey is a 64 year old female with severe bilateral knee osteoarthritis and class 3 obesity who presents for evaluation of chronic, debilitating knee pain.  She has chronic bilateral knee pain, left greater than right, with grinding and occasional giving way. Pain is localized to both knees. She cannot ambulate independently and uses a wheelchair, and her pain and limited mobility restrict attempts at weight loss.  She was previously advised to delay knee arthroplasty until she loses weight. Prior intra-articular hyaluronic acid and corticosteroid injections did not help. She takes meloxicam 15 mg daily without benefit and cannot tolerate other NSAIDs due to cardiac issues. She is not using tramadol  or pregabalin . She wishes to avoid opioids and is seeking non-narcotic options to improve pain, mobility, and ability to lose weight.  She started tirzepatide for weight loss two months ago and has reduced weight from 340 lbs to 289 lbs over the past year. She is prediabetic but does not know her current A1c. She continues to smoke and cannot increase physical activity because of severe knee pain and limited mobility.      Past Medical History: Past Medical History:  Diagnosis Date   Aortic valve stenosis with insufficiency    Cardiac resynchronization therapy defibrillator (CRT-D) in place 03/09/2022   [facility] [employer] MOMENTUM CRT-D IS1 DF4 S/N: [id number] implanted 12/17/2021 [facility] [employer] RA appendage LEAD, INGEVITY PLUS 52CM S/N: [id number] implanted 12/17/2021 [facility] [employer] Left bundle LEAD, INGEVITY PLUS 59CM S/N: [id number] implanted 12/17/2021 [employer] mid RV septum LEAD, RELIANCE 4-FRONT SGL ACTIVE 59CM S/N: [id number] implanted 12/17/2021   Carotid artery occlusion 12/2019   COPD (chronic obstructive pulmonary disease) (CMS/HHS-HCC)     Depression    Encounter for assessment of implantable cardioverter-defibrillator (ICD) 06/08/2022   Heart disease    Heart murmur ongoing   Hypertension    Morbid obesity (CMS-HCC)    OSA (obstructive sleep apnea)    S/P ICD (internal cardiac defibrillator) procedure 12/17/2021   Thyroid disease    Venous disease ongoing    Past Surgical History: Past Surgical History:  Procedure Laterality Date   TUBAL LIGATION  1997   REPLACEMENT AORTIC VALVE  11/2018   DILATION AND CURETTAGE OF UTERUS  02/28/2020   CCW   Fractional D&C with hysteroscopy  09/25/2021   CAROTID ENDARTERECTOMY     CHOLECYSTECTOMY  1992    Past Family History: Family History  Adopted: Yes  Problem Relation Age of Onset   Heart disease Sister    Cancer Sister    Heart murmur Sister    Heart murmur Brother     Medications: Current Outpatient Medications  Medication Sig Dispense Refill   acetaminophen  (TYLENOL ) 325 MG tablet Take 650 mg by mouth as needed for Pain     albuterol  90 mcg/actuation inhaler Inhale 2-4 puffs by mouth every 4 hours as needed for wheezing, cough, and/or shortness of breath     atorvastatin  (LIPITOR) 10 MG tablet Take 1 tablet (10 mg total) by mouth once daily 90 tablet 1   carvediloL (COREG) 6.25 MG tablet Take 6.25 mg by mouth 2 (two) times daily with meals     cyclobenzaprine (FLEXERIL) 10 MG tablet Take 10 mg by mouth at bedtime     dapagliflozin propanediol (FARXIGA) 10 mg tablet Take 1 tablet (10 mg total) by mouth once daily 90 tablet  0   fluticasone propionate (FLONASE) 50 mcg/actuation nasal spray Place 1 spray into both nostrils once daily     FUROsemide  (LASIX ) 40 MG tablet Take 0.5 tablets (20 mg total) by mouth once daily 135 tablet 3   inhalational spacer (AEROCHAMBER MV) spacer Use as instructed     levothyroxine  (SYNTHROID ) 75 MCG tablet Take 75 mcg by mouth once daily     meloxicam (MOBIC) 15 MG tablet Take 15 mg by mouth once daily      mometasone -formoterol  (DULERA ) 200-5 mcg/actuation inhaler Inhale 2 inhalations into the lungs 2 (two) times daily     multivitamin with minerals tablet Take 1 tablet by mouth once daily     OXYGEN-AIR DELIVERY SYSTEMS MISC Inhale into the lungs once daily     pregabalin  (LYRICA ) 25 MG capsule      progesterone (PROMETRIUM) 200 MG capsule Take 1 capsule (200 mg total) by mouth at bedtime 30 capsule 1   sacubitriL-valsartan (ENTRESTO) 97-103 mg tablet TAKE 1 TABLET BY MOUTH 2 TIMES A DAY. 180 tablet 1   sertraline (ZOLOFT) 100 MG tablet Take 100 mg by mouth once daily     spironolactone  (ALDACTONE ) 25 MG tablet Take 0.5 tablets (12.5 mg total) by mouth once daily 45 tablet 1   tiotropium bromide  (SPIRIVA  RESPIMAT) 2.5 mcg/actuation inhalation spray Inhale 5 mcg into the lungs once daily     tirzepatide (MOUNJARO) 15 mg/0.5 mL pen injector Inject 0.5 mLs (15 mg total) subcutaneously once a week 6 mL 3   traMADoL  (ULTRAM ) 50 mg tablet Take 50 mg by mouth every 6 (six) hours as needed     traZODone  (DESYREL ) 50 MG tablet Take 150 mg by mouth at bedtime     valACYclovir  (VALTREX ) 1000 MG tablet take 1 tablet by mouth every 8 hours for 7 days     venlafaxine  (EFFEXOR -XR) 150 MG XR capsule      No current facility-administered medications for this visit.    Allergies: No Known Allergies   Review of Systems:  A comprehensive 14 point ROS was performed, reviewed by me today, and the pertinent orthopaedic findings are documented in the HPI.   Exam: BP 120/88   Ht 152.4 cm (5')   Wt (!) 126.1 kg (278 lb)   LMP  (LMP Unknown)   BMI 54.29 kg/m  General/Constitutional: The patient appears to be well-nourished, well-developed, and in no acute distress. Neuro/Psych: Normal mood and affect, oriented to person, place and time. Eyes: Non-icteric.  Pupils are equal, round, and reactive to light, and exhibit synchronous movement. ENT: Unremarkable. Lymphatic: No palpable  adenopathy. Respiratory: Non-labored breathing Cardiovascular: No edema, swelling or tenderness, except as noted in detailed exam. Integumentary: No impressive skin lesions present, except as noted in detailed exam. Musculoskeletal: Unremarkable, except as noted in detailed exam.  General: Well developed, well nourished 64 y.o. female in no apparent distress.  Normal affect.  Normal communication.  Patient answers questions appropriately.  Presents in a wheelchair.  Left lower Extremities: Examination of the left lower extremity reveals no bony abnormality, no edema, no effusion and no ecchymosis.  There is no valgus or varus abnormality.  The patient is non-tender along the lateral joint line, and is tender along the medial joint line.  She has 5 to 95 degrees range of motion.  There is no discomfort with range of motion exercises.  The patient has a negative rotational Mcmurray test.  There is no retropatellar discomfort.  The patient has a negative patella  stretch test.  The patient has a negative varus stress test and a negative valgus stress test, in looking for stability.  The patient has a negative Lachman's test.  Vascular: The patient has a negative Toula' test bilaterally.  The patient had a normal dorsalis pedis and posterior tibial pulse.  There is normal skin warmth.  There is normal capillary refill bilaterally.    Neurologic: The patient has a negative straight leg raise.  The patient has normal muscle strength testing for the quadriceps, calves, ankle dorsiflexion, ankle plantarflexion, and extensor hallicus longus.  The patient has sensation that is intact to light touch.  The deep tendon reflexes are normal at the patella and achilles.  No clonus is noted.      AP lateral sunrise and AP flexion views of the left knee are ordered interpreted by me in the office today.  Impression: Complete loss of joint space in the medial compartment left knee with subchondral cyst formation  along the medial tibial plateau and medial femoral condyle.  Spurring along the lateral tibial plateau.  Posterior femoral condylar osteophytes and osteophytes along the trochlea.  Patella tracks well the trochlear groove.  No evidence of acute bony abnormality  Impression: Left knee pain, unspecified chronicity [M25.562] Left knee pain, unspecified chronicity  (primary encounter diagnosis) Class 3 severe obesity due to excess calories with serious comorbidity and body mass index (BMI) of 50.0 to 59.9 in adult (CMS-HCC)  Plan:  Assessment & Plan Bilateral knee osteoarthritis Severe, chronic bilateral knee osteoarthritis with bone-on-bone changes, left greater than right. Pain is functionally limiting and refractory to NSAIDs and injections. Surgical intervention deferred due to elevated BMI. - Referred for left knee genicular nerve block (radiofrequency ablation) to address pain and improve function. - Discussed potential right-sided genicular nerve block if left-sided procedure is effective. - Educated on genicular nerve block procedure: needle placement under fluoroscopic guidance, radiofrequency ablation, minimal procedural pain, performed in office. - Advised against further corticosteroid or viscosupplementation injections due to lack of efficacy. - Reviewed NSAID limitations due to cardiac history; maximum meloxicam dose is 15 mg daily. - Advised against narcotics; pain management referral if chronic opioids are required. - Recommended aquatic therapy to facilitate weight loss and minimize knee pain. - Advised to maintain mobility to prevent joint stiffness. - Advised follow-up with orthopedic surgery in three months to reassess candidacy for knee arthroplasty.  Class 3 severe obesity (BMI 50-59.9) Longstanding class 3 severe obesity (BMI 54) with recent weight loss. Obesity is a barrier to knee arthroplasty due to increased surgical risks. BMI reduction to below 40 and smoking cessation  required for surgical candidacy. - Encouraged continued weight loss efforts and acknowledged progress. - Emphasized need to reduce BMI to below 40 for surgical candidacy. - Educated on surgical risks at high BMI: tibial component subsidence, instability, need for revision, increased infection risk, and potential for amputation. - Advised smoking cessation prior to surgery. - Recommended continued use of Mounjaro as tolerated. - Encouraged increased physical activity, including aquatic therapy. - Advised follow-up with orthopedic surgery in three months to reassess surgical candidacy based on weight loss progress.    This note was generated in part with voice recognition software and I apologize for any typographical errors that were not detected and corrected.   Debby Lonni Amber MPA-C

## 2024-03-25 NOTE — Progress Notes (Unsigned)
 Virtual Visit via Video Note  I connected with Kristie Dorsey on 03/29/24 at  3:30 PM EST by a video enabled telemedicine application and verified that I am speaking with the correct person using two identifiers.  Location: Patient: home Provider: home office Persons participated in the visit- patient, provider    I discussed the limitations of evaluation and management by telemedicine and the availability of in person appointments. The patient expressed understanding and agreed to proceed.    I discussed the assessment and treatment plan with the patient. The patient was provided an opportunity to ask questions and all were answered. The patient agreed with the plan and demonstrated an understanding of the instructions.   The patient was advised to call back or seek an in-person evaluation if the symptoms worsen or if the condition fails to improve as anticipated.    Kristie Sleet, MD    [facility] MD/PA/NP OP Progress Note  03/29/2024 4:08 PM Kristie Dorsey  MRN:  [id number]  Chief Complaint:  Chief Complaint  Patient presents with   Follow-up   HPI:  This is a follow-up appointment for depression, anxiety and insomnia.  She states that she notices some difference with rexulti .  She feels a little happier.  She has been doing things such as cooking, reading Bibles, and playing games on the phone.  However, she does not have much energy and feels blah.  She does not go to church and does not want to go anywhere.  She is concerned about some judgment from others.  She also wants to isolate as she feels overstimulated.  She has crying spells since Thanksgiving.  She states that she went through a lot in the past.  She feels nervous, and feels pins-and-needles.  She will occasionally have thoughts of why going on, while nothing going to ever change. She does not want to feel anything. However, she denies any SI plan or intent, and has agreed to contact emergency resources if any worsening.  She  states that the relationship with her husband has been the same.  Although he states he loves her, but he does not act like it.  She also reports feeling a role reversal at home.  She feels this way due to the sound of voice.  Her son is loud in general, although she denies being yelled at.  She denies any drowsiness or fall.  She no-showed to therapy appointment as she did not feel like going there.  However, she acknowledges the need to talk with somebody outside of her family/her sister.  After provided psychoeducation about the nature of the therapy, she is willing to try again.  She agrees with the plans as outlined below.    BP 120/88  Ht 152.4 cm (5')  Wt (!) 126.1 kg (278 lb)  LMP (LMP Unknown)  BMI 54.29 kg/m  - 03/2024 Wt Readings from Last 3 Encounters:  01/12/24 278 lb (126.1 kg)  04/20/23 283 lb (128.4 kg)  06/23/22 285 lb 3.2 oz (129.4 kg)     Substance use   Tobacco Alcohol Other substances/  Current 1/2 PPD denies denies  Past   denies denies  Past Treatment Patch (sick), chantix (drowsiness), Wellbutrin          Household: husband, two daughters, and 15 yo son (she has grandchildren- 5,7,9 months) Marital status: married Number of children: 8 Employment:  unemployed, on disability since 2022 due to depression, 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 Dorsey of her biological parents abused alcohol. Her mother was using alcohol when she was born  Visit Diagnosis:    ICD-10-CM   1. MDD (major depressive disorder), recurrent episode, mild  F33.0     2. Anxiety disorder, unspecified type  F41.9     3. Insomnia, unspecified type  G47.00       Past Psychiatric History: Please see initial evaluation for full details. I have reviewed the history. No updates at this time.     Past Medical History:  Past Medical History:  Diagnosis Date   Abnormal uterine bleeding due to endometrial polyp    Anemia     Anginal pain    Aortic atherosclerosis    Arthritis    Cardiomyopathy (HCC)    a.) TTE 03/25/2018: EF 30-35%; b.) TTE 08/03/2019: EF 30%; c.) TTE 01/15/2020: EF 35%; d.) TTE 12/31/2020: EF 30%; e.) TTE 08/20/2021: EF 25%   Carotid stenosis    a.) s/p RIGHT CEA 06/20/2020   CHF (congestive heart failure) (HCC)    a.) TTE 03/25/18: EF 30-35%, diff HK, mild MR, sev AS (MPG 39), G1DD; b.) TTE 08/03/19: EF 30%, mild LVH, glob HK, mod LAE, mild MR, triv TR; c.) TTE 01/15/20: EF 35%, mild LVH, basal-apical/antsep/infsep HK, mild LAE, triv TR/PR, mod MR, G1DD; d.) TTE 12/31/20: EF 30%, mod glob HK, mod LAE, triv TR/PR, mild MR, G1DD; e.) TTE 08/20/21: EF 25%, mod MVE/LAE, glob HK, septal AK, MAC, triv TR, mild MR   Complication of anesthesia    a.) delayed emergence following AVR   COPD (chronic obstructive pulmonary disease) (HCC)    Coronary artery disease    Depression    Dyspnea    Hypertension    Hypothyroidism    LBBB (left bundle branch block)    Morbid obesity (HCC)    MRSA nasal colonization 06/20/2020   Nonrheumatic aortic (valve) stenosis    a.) TTE 03/25/2018: EF 30-35, sev AS (MPG 39 mmHg); b.) 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; 23 mm Edwards SAPIEN 3   Obesity hypoventilation syndrome (HCC)    On supplemental oxygen therapy    a.) 2-3 L/[place] as needed   OSA treated with BiPAP    T2DM (type 2 diabetes mellitus) (HCC)     Past Surgical History:  Procedure Laterality Date   CHOLECYSTECTOMY     DILATATION & CURETTAGE/HYSTEROSCOPY WITH MYOSURE N/A 09/25/2021   Procedure: FRACTIONAL DILATATION & CURETTAGE/HYSTEROSCOPY;  Surgeon: Schermerhorn, Debby PARAS, MD;  Location: [facility];  Service: Gynecology;  Laterality: N/A;   ENDARTERECTOMY Right 06/20/2020   Procedure: ENDARTERECTOMY CAROTID;  Surgeon: Marea Selinda RAMAN, MD;  Location: [facility];  Service: Vascular;  Laterality: Right;   HYSTEROSCOPY WITH D & C N/A 02/28/2020   Procedure: DILATATION AND CURETTAGE;  Surgeon: Ward, Mitzie BROCKS, MD;  Location:  [facility];  Service: Gynecology;  Laterality: N/A;   LEFT HEART CATH AND CORONARY ANGIOGRAPHY N/A 04/14/2018   Procedure: LEFT HEART CATH AND CORONARY ANGIOGRAPHY;  Surgeon: Florencio Cara BIRCH, MD;  Location: [facility];  Service: Cardiovascular;  Laterality: N/A;   TONSILLECTOMY     TRANSCATHETER AORTIC VALVE REPLACEMENT, TRANSAORTIC Right 11/01/2018   Procedure: TRANSCATHETER AORTIC VALVE REPLACEMENT, TRANSAORTIC via a RIGHT FEMORAL APPROACH; Location: [facility]; Surgeon: Norleen Grumbling, MD   TUBAL LIGATION      Family Psychiatric History: Please see initial evaluation for full details. I have reviewed the history. No updates at this time.  Family History:  Family History  Adopted: Yes  Problem Relation Age of Onset   Alcohol abuse Mother    Aneurysm Mother    Alcohol abuse Father    Cancer Father    Depression Daughter    ADD / ADHD Son    Bipolar disorder Son     Social History:  Social History   Socioeconomic History   Marital status: Married    Spouse name: eugene   Number of children: 2   Years of education: Not on file   Highest education level: Not on file  Occupational History   Occupation: designer, multimedia    Comment: disability  Tobacco Use   Smoking status: Every Day    Current packs/day: 1.50    Average packs/day: 1.5 packs/day for 51.1 years (76.6 ttl pk-yrs)    Types: Cigarettes    Start date: 03/02/1973   Smokeless tobacco: Never   Tobacco comments:    Smokes 0.5 PPD khj 04/20/2023        Started smoking at 64 yrs old    Smoked 2 PPD at her heaviest.  Vaping Use   Vaping status: Some Days   Start date: 11/01/2018   Substances: Nicotine   Devices: trying to quit  Substance and Sexual Activity   Alcohol use: No   Drug use: No   Sexual activity: Not Currently  Other Topics Concern   Not on file  Social History Narrative   Patient lives with husband and 2 daughters. Has dogs.   Patient feels safe in her home.   She still vapes on a daily basis.  Nicotine in her liquid.   Social Drivers of Health   Tobacco Use: High Risk (03/29/2024)   Patient History    Smoking Tobacco Use: Every Day    Smokeless Tobacco Use: Never    Passive Exposure: Not on file  Financial Resource Strain: Low Risk  (03/08/2024)   [facility] from [facility]   Overall Financial Resource Strain (CARDIA)    Difficulty of Paying Living Expenses: Not very hard  Food Insecurity: No Food Insecurity (03/08/2024)   Received from [facility]   Epic    Within the past 12 months, you worried that your food would run out before you got the money to buy more.: Never true    Within the past 12 months, the food you bought just didn't last and you didn't have money to get more.: Never true  Transportation Needs: No Transportation Needs (03/08/2024)   Received from [facility] - Transportation    In the past 12 months, has lack of transportation kept you from medical appointments or from getting medications?: No    Lack of Transportation (Non-Medical): No  Physical Activity: Not on file  Stress: Not on file  Social Connections: Not on file  Depression (PHQ2-9): Medium Risk (06/17/2023)   Depression (PHQ2-9)    PHQ-2 Score: 5  Alcohol Screen: Not on file  Housing: Low Risk  (03/08/2024)   Received from [facility]   Epic    In the last 12 months, was there a time when you were not able to pay the mortgage or rent on time?: No    In the past 12 months, how many times have you moved where you were living?: 0    At any time in the past 12 months, were you homeless or living in a shelter (including now)?: No  Utilities: Not At  Risk (03/08/2024)   Received from [facility]   Epic    In the past 12 months has the electric, gas, oil, or water company threatened to shut off services in your home?: No  Health Literacy: Not on file    Allergies: Allergies[1]  Metabolic Disorder Labs: No  results found for: HGBA1C, MPG No results found for: PROLACTIN No results found for: CHOL, TRIG, HDL, CHOLHDL, VLDL, LDLCALC No results found for: TSH  Therapeutic Level Labs: No results found for: LITHIUM No results found for: VALPROATE No results found for: CBMZ  Current Medications: Current Outpatient Medications  Medication Sig Dispense Refill   acetaminophen  (TYLENOL ) 325 MG tablet Take by mouth.     albuterol  (VENTOLIN  HFA) 108 (90 Base) MCG/ACT inhaler Inhale 2 puffs into the lungs every 6 (six) hours as needed for wheezing or shortness of breath. 8 g 2   atorvastatin  (LIPITOR) 10 MG tablet TAKE 1 TABLET BY MOUTH ONCE DAILY. 90 tablet 0   [START ON 04/09/2024] brexpiprazole  (REXULTI ) 0.25 MG TABS tablet Take 1 tablet (0.25 mg total) by mouth daily. 30 tablet 0   carvedilol (COREG) 6.25 MG tablet Take 6.25 mg by mouth daily.     dapagliflozin propanediol (FARXIGA) 10 MG TABS tablet Take 10 mg by mouth daily.     ENTRESTO 97-103 MG Take 1 tablet by mouth 2 (two) times daily.     ferrous sulfate 325 (65 FE) MG tablet Take 325 mg by mouth daily with breakfast.     fluticasone (FLONASE) 50 MCG/ACT nasal spray Place into Dorsey nostrils.     furosemide  (LASIX ) 40 MG tablet Take 40 mg by mouth daily.      levothyroxine  (SYNTHROID , LEVOTHROID) 75 MCG tablet Take 75 mcg by mouth daily before breakfast.      meloxicam (MOBIC) 15 MG tablet Take 15 mg by mouth daily.     mometasone -formoterol  (DULERA ) 200-5 MCG/ACT AERO Inhale 2 puffs into the lungs 2 (two) times daily. Please schedule office visit before any future refills. 8.8 g 0   Multiple Vitamins-Minerals (MULTIVITAMIN WITH MINERALS) tablet Take 1 tablet by mouth daily.     OXYGEN Inhale 2-3 L/min into the lungs as needed (dyspnea, COPD related symptoms, obsesity related hypoventilation syndrome).     OZEMPIC, 0.25 OR 0.5 MG/DOSE, 2 MG/1.5ML SOPN Inject into the skin.     OZEMPIC, 2 MG/DOSE, 8 MG/3ML SOPN Inject  into the skin.     pregabalin  (LYRICA ) 25 MG capsule Take 1 capsule (25 mg total) by mouth 2 (two) times daily. 60 capsule 1   progesterone (PROMETRIUM) 200 MG capsule Take 200 mg by mouth at bedtime.     Spacer/Aero-Holding Chambers (AEROCHAMBER MV) inhaler Use as instructed 1 each 0   spironolactone  (ALDACTONE ) 25 MG tablet Take 25 mg by mouth daily.     Tiotropium Bromide  Monohydrate (SPIRIVA  RESPIMAT) 2.5 MCG/ACT AERS Inhale 2 puffs into the lungs daily. 4 g 11   traMADol  (ULTRAM ) 50 MG tablet Take 1 tablet (50 mg total) by mouth every 6 (six) hours as needed. 20 tablet 0   traZODone  (DESYREL ) 50 MG tablet Take 3 tablets (150 mg total) by mouth at bedtime. 90 tablet 5   [START ON 04/19/2024] venlafaxine  XR (EFFEXOR -XR) 150 MG 24 hr capsule Take 1 capsule (150 mg total) by mouth daily with breakfast. 30 capsule 5   venlafaxine  XR (EFFEXOR -XR) 75 MG 24 hr capsule Take 1 capsule (75 mg total) by mouth daily with breakfast. Take total  of 225 mg daily, take along with 150 mg cap 30 capsule 5   No current facility-administered medications for this visit.     Musculoskeletal: Strength & Muscle Tone: N/A Gait & Station: N/A Patient leans: N/A  Psychiatric Specialty Exam: Review of Systems  Psychiatric/Behavioral:  Positive for dysphoric mood and sleep disturbance. Negative for agitation, behavioral problems, confusion, decreased concentration, hallucinations, self-injury and suicidal ideas. The patient is nervous/anxious. The patient is not hyperactive.   All other systems reviewed and are negative.   Last menstrual period 02/21/2020.There is no height or weight on file to calculate BMI.  General Appearance: Well Groomed  Eye Contact:  Good  Speech:  Clear and Coherent  Volume:  Normal  Mood:  blah  Affect:  Appropriate, Congruent, and Tearful  Thought Process:  Coherent  Orientation:  Full (Time, Place, and Person)  Thought Content: Logical   Suicidal Thoughts:  No  Homicidal  Thoughts:  No  Memory:  Immediate;   Good  Judgement:  Good  Insight:  Good  Psychomotor Activity:  Normal  Concentration:  Concentration: Good and Attention Span: Good  Recall:  Good  Fund of Knowledge: Good  Language: Good  Akathisia:  No  Handed:  Right  AIMS (if indicated): not done  Assets:  Communication Skills Desire for Improvement  ADL's:  Intact  Cognition: WNL  Sleep:  Fair   Screenings: GAD-7    Flowsheet Row Office Visit from 06/17/2023 in [facility] Office Visit from 04/02/2022 in [facility]  Total GAD-7 Score 4 4   PHQ2-9    Flowsheet Row Office Visit from 06/17/2023 in [facility] Counselor from 11/10/2022 in [facility] Office Visit from 09/06/7973 in [facility]  PHQ-2 Total Score 2 4 2   PHQ-9 Total Score 5 12 9    Flowsheet Row ED from 01/12/2024 in [facility] Visit from 04/02/2022 in [facility] Admission (Discharged) from 09/25/2021 in [facility]  C-SSRS RISK CATEGORY No Risk No Risk No Risk     Assessment and Plan:  Kristie Dorsey is a 64 y.o.  female with a history of COPD, chronic respiratory failure on home oxygen, OSA, chronic systolic CHF (EF 74%, NYHA III), s/p ICD, s/p TAVR, hypothyroidism, who presents for follow up for below.  1. MDD (major depressive disorder), recurrent episode, mild 2. Anxiety disorder, unspecified type The patient reports a history of knee pain and prenatal alcohol exposure, as her mother was drinking during pregnancy. Dorsey of her biological parents had histories of alcohol abuse. Socially, she was adopted at age 73, and she currently reports difficulty with her son, who lacks boundaries, and  marital conflict. History: depression since age 67, originally on sertraline 100 mg daily, rexulti  0.5 mg daily, trazodone  150 mg qhs The exam is notable for occasional tearfulness. She continues to experience depressive symptoms, anxiety with marked anhedonia, although there has been overall improvement since starting rexulti .  She agrees to maintain on the current medication regimen for now while proceeding with therapy, although further adjustment could be considered in the future.  Will continue venlafaxine  to target depression and anxiety, along with rexulti  as adjunctive treatment for depression.  Will continue pregabalin  for anxiety given she reports significant benefit for anxiety.   3. Insomnia, unspecified type - uses CPAP  machine regularly     Overall stable.  Will continue trazodone  as needed for insomnia.   4. High risk medication use       Last checked  EKG HR 74, QTc459msec 01/2024  Lipid panels   Due  HbA1c Glu 102 06/2023      Plan Continue venlafaxine  225 mg daily  Continue pregabalin  25 mg twice a day Continue rexulti  0.25 mg daily Continue Trazodone  150 mg at night as needed for insomnia Next appointment: 3/4 at 11:30, video Referred for therapy - on Ozempic QTc HR 80, 470 msec 06/2022   Past trials- Buspar  (felt sick), rexulti  (discontinued to mitigate its risk of weight gain), pregabalin  (drowsiness),  trazodone , Ambien ,    The patient demonstrates the following risk factors for suicide: Chronic risk factors for suicide include: psychiatric disorder of depression . Acute risk factors for suicide include: family or marital conflict and unemployment. Protective factors for this patient include: positive social support. Considering these factors, the overall suicide risk at this point appears to be low. Patient is appropriate for outpatient follow up.   Collaboration of Care: Collaboration of Care: Other reviewed notes in Epic  Patient/Guardian was advised Release of  Information must be obtained prior to any record release in order to collaborate their care with an outside provider. Patient/Guardian was advised if they have not already done so to contact the registration department to sign all necessary forms in order for us  to release information regarding their care.   Consent: Patient/Guardian gives verbal consent for treatment and assignment of benefits for services provided during this visit. Patient/Guardian expressed understanding and agreed to proceed.    Kristie Sleet, MD 03/29/2024, 4:08 PM     [1] No Known Allergies

## 2024-03-29 ENCOUNTER — Encounter: Payer: Self-pay | Admitting: Psychiatry

## 2024-03-29 ENCOUNTER — Telehealth: Admitting: Psychiatry

## 2024-03-29 DIAGNOSIS — F419 Anxiety disorder, unspecified: Secondary | ICD-10-CM | POA: Diagnosis not present

## 2024-03-29 DIAGNOSIS — G4700 Insomnia, unspecified: Secondary | ICD-10-CM

## 2024-03-29 DIAGNOSIS — F330 Major depressive disorder, recurrent, mild: Secondary | ICD-10-CM

## 2024-03-29 MED ORDER — BREXPIPRAZOLE 0.25 MG PO TABS: 0.2500 mg | ORAL_TABLET | Freq: Every day | ORAL | 0 refills | Status: AC

## 2024-03-29 MED ORDER — VENLAFAXINE HCL ER 150 MG PO CP24: 150.0000 mg | ORAL_CAPSULE | Freq: Every day | ORAL | 5 refills | Status: AC

## 2024-03-29 NOTE — Patient Instructions (Signed)
 Continue venlafaxine  225 mg daily  Continue pregabalin  25 mg twice a day Continue rexulti  0.25 mg daily Continue Trazodone  150 mg at night as needed for insomnia Next appointment: 3/4 at 11:30

## 2024-04-03 ENCOUNTER — Ambulatory Visit: Admitting: Internal Medicine

## 2024-05-03 ENCOUNTER — Telehealth: Admitting: Psychiatry

## 2024-06-29 ENCOUNTER — Ambulatory Visit: Admitting: Internal Medicine
